# Patient Record
Sex: Female | Born: 1960 | Race: White | Hispanic: No | Marital: Single | Smoking: Current every day smoker
Health system: Southern US, Community
[De-identification: ages and names within clinical notes are randomized; demographics above are authoritative.]

## PROBLEM LIST (undated history)

## (undated) DIAGNOSIS — F419 Anxiety disorder, unspecified: Secondary | ICD-10-CM

## (undated) DIAGNOSIS — I2510 Atherosclerotic heart disease of native coronary artery without angina pectoris: Secondary | ICD-10-CM

## (undated) DIAGNOSIS — R0600 Dyspnea, unspecified: Secondary | ICD-10-CM

## (undated) DIAGNOSIS — G5600 Carpal tunnel syndrome, unspecified upper limb: Secondary | ICD-10-CM

## (undated) DIAGNOSIS — R011 Cardiac murmur, unspecified: Secondary | ICD-10-CM

## (undated) DIAGNOSIS — I7300 Raynaud's syndrome without gangrene: Secondary | ICD-10-CM

## (undated) DIAGNOSIS — IMO0002 Reserved for concepts with insufficient information to code with codable children: Secondary | ICD-10-CM

## (undated) DIAGNOSIS — R911 Solitary pulmonary nodule: Secondary | ICD-10-CM

## (undated) DIAGNOSIS — R590 Localized enlarged lymph nodes: Secondary | ICD-10-CM

## (undated) DIAGNOSIS — E785 Hyperlipidemia, unspecified: Secondary | ICD-10-CM

## (undated) DIAGNOSIS — K219 Gastro-esophageal reflux disease without esophagitis: Secondary | ICD-10-CM

## (undated) DIAGNOSIS — I10 Essential (primary) hypertension: Secondary | ICD-10-CM

## (undated) DIAGNOSIS — J189 Pneumonia, unspecified organism: Secondary | ICD-10-CM

## (undated) DIAGNOSIS — M0520 Rheumatoid vasculitis with rheumatoid arthritis of unspecified site: Secondary | ICD-10-CM

## (undated) DIAGNOSIS — E119 Type 2 diabetes mellitus without complications: Secondary | ICD-10-CM

## (undated) HISTORY — DX: Raynaud's syndrome without gangrene: I73.00

## (undated) HISTORY — PX: TONSILLECTOMY: SUR1361

## (undated) HISTORY — DX: Carpal tunnel syndrome, unspecified upper limb: G56.00

## (undated) HISTORY — DX: Reserved for concepts with insufficient information to code with codable children: IMO0002

## (undated) HISTORY — DX: Hyperlipidemia, unspecified: E78.5

## (undated) HISTORY — PX: TUBAL LIGATION: SHX77

## (undated) HISTORY — DX: Gastro-esophageal reflux disease without esophagitis: K21.9

## (undated) HISTORY — DX: Rheumatoid vasculitis with rheumatoid arthritis of unspecified site: M05.20

## (undated) HISTORY — DX: Essential (primary) hypertension: I10

## (undated) HISTORY — PX: UPPER GI ENDOSCOPY: SHX6162

---

## 1990-01-12 HISTORY — PX: ABDOMINAL HYSTERECTOMY: SHX81

## 1996-11-12 HISTORY — PX: BREAST BIOPSY: SHX20

## 2003-05-03 ENCOUNTER — Other Ambulatory Visit: Payer: Self-pay

## 2003-10-26 ENCOUNTER — Ambulatory Visit: Payer: Self-pay | Admitting: Internal Medicine

## 2004-05-15 ENCOUNTER — Ambulatory Visit: Payer: Self-pay | Admitting: General Surgery

## 2004-06-10 ENCOUNTER — Emergency Department: Payer: Self-pay | Admitting: Emergency Medicine

## 2005-06-16 ENCOUNTER — Ambulatory Visit: Payer: Self-pay | Admitting: General Surgery

## 2005-07-03 ENCOUNTER — Encounter: Payer: Self-pay | Admitting: General Practice

## 2005-08-07 ENCOUNTER — Ambulatory Visit: Payer: Self-pay | Admitting: Internal Medicine

## 2005-08-10 ENCOUNTER — Ambulatory Visit: Payer: Self-pay | Admitting: Internal Medicine

## 2005-09-21 ENCOUNTER — Ambulatory Visit: Payer: Self-pay | Admitting: General Surgery

## 2006-02-11 ENCOUNTER — Ambulatory Visit: Payer: Self-pay | Admitting: Internal Medicine

## 2006-07-27 ENCOUNTER — Ambulatory Visit: Payer: Self-pay | Admitting: General Surgery

## 2007-05-27 ENCOUNTER — Ambulatory Visit: Payer: Self-pay | Admitting: Internal Medicine

## 2007-07-26 ENCOUNTER — Ambulatory Visit: Payer: Self-pay | Admitting: General Surgery

## 2008-01-13 HISTORY — PX: COLONOSCOPY: SHX174

## 2008-07-26 ENCOUNTER — Ambulatory Visit: Payer: Self-pay | Admitting: General Surgery

## 2009-01-12 HISTORY — PX: BREAST BIOPSY: SHX20

## 2009-07-29 ENCOUNTER — Ambulatory Visit: Payer: Self-pay | Admitting: General Surgery

## 2010-01-12 HISTORY — PX: CARPAL TUNNEL RELEASE: SHX101

## 2010-01-28 ENCOUNTER — Ambulatory Visit: Payer: Self-pay | Admitting: Ophthalmology

## 2010-06-30 ENCOUNTER — Ambulatory Visit: Payer: Self-pay | Admitting: Specialist

## 2010-07-07 ENCOUNTER — Ambulatory Visit: Payer: Self-pay | Admitting: Specialist

## 2010-07-31 ENCOUNTER — Ambulatory Visit: Payer: Self-pay | Admitting: General Surgery

## 2011-03-03 ENCOUNTER — Ambulatory Visit: Payer: Self-pay | Admitting: Internal Medicine

## 2011-08-13 ENCOUNTER — Ambulatory Visit: Payer: Self-pay | Admitting: General Surgery

## 2012-08-16 ENCOUNTER — Ambulatory Visit: Payer: Self-pay | Admitting: General Surgery

## 2012-08-18 ENCOUNTER — Encounter: Payer: Self-pay | Admitting: General Surgery

## 2012-08-30 ENCOUNTER — Ambulatory Visit (INDEPENDENT_AMBULATORY_CARE_PROVIDER_SITE_OTHER): Payer: 59 | Admitting: General Surgery

## 2012-08-30 ENCOUNTER — Encounter: Payer: Self-pay | Admitting: General Surgery

## 2012-08-30 VITALS — BP 122/70 | HR 76 | Resp 12 | Ht 63.0 in | Wt 120.0 lb

## 2012-08-30 DIAGNOSIS — Z803 Family history of malignant neoplasm of breast: Secondary | ICD-10-CM

## 2012-08-30 DIAGNOSIS — Z1239 Encounter for other screening for malignant neoplasm of breast: Secondary | ICD-10-CM

## 2012-08-30 DIAGNOSIS — Z1231 Encounter for screening mammogram for malignant neoplasm of breast: Secondary | ICD-10-CM

## 2012-08-30 NOTE — Progress Notes (Signed)
Patient ID: Tracey Walters, female   DOB: 1960-10-22, 52 y.o.   MRN: [id number]  Chief Complaint  Patient presents with  . Follow-up    mammogram    HPI Tracey Walters is a 52 y.o. female who presents for a breast evaluation. The most recent mammogram was done on 08/15/12. Patient does perform regular self breast checks and gets regular mammograms done.  No new breast issues.   HPI  Past Medical History  Diagnosis Date  . Carpal tunnel syndrome     right wrist  . Hypertension 20 yrs  . Ulcer   . Raynaud's phenomenon   . Hyperlipidemia   . GERD (gastroesophageal reflux disease)     Past Surgical History  Procedure Laterality Date  . Colonoscopy  2010  . Upper gi endoscopy    . Breast biopsy Right 2011  . Tonsillectomy    . Tubal ligation    . Abdominal hysterectomy  1992  . Carpal tunnel release Right 2012    Family History  Problem Relation Age of Onset  . Breast cancer Sister     Social History History  Substance Use Topics  . Smoking status: Current Every Day Smoker -- 1.00 packs/day for 30 years    Types: Cigarettes  . Smokeless tobacco: Never Used  . Alcohol Use: Yes    Allergies  Allergen Reactions  . Penicillins Hives    Current Outpatient Prescriptions  Medication Sig Dispense Refill  . amLODipine-benazepril (LOTREL) 5-10 MG per capsule Take 1 capsule by mouth daily.      Marland Kitchen aspirin 81 MG tablet Take 81 mg by mouth daily.      . pantoprazole (PROTONIX) 40 MG tablet Take 40 mg by mouth daily.       No current facility-administered medications for this visit.    Review of Systems Review of Systems  Respiratory: Negative.   Cardiovascular: Negative.     Blood pressure 122/70, pulse 76, resp. rate 12, height 5\' 3"  (1.6 m), weight 120 lb (54.432 kg).  Physical Exam Physical Exam  Constitutional: She is oriented to person, place, and time. She appears well-developed and well-nourished.  Cardiovascular: Normal rate and regular rhythm.   Murmur  heard.  Systolic murmur is present with a grade of 1/6  Pulmonary/Chest: Effort normal and breath sounds normal. Right breast exhibits no inverted nipple, no mass, no nipple discharge, no skin change and no tenderness. Left breast exhibits no inverted nipple, no mass, no nipple discharge, no skin change and no tenderness.  Lymphadenopathy:    She has no cervical adenopathy.    She has no axillary adenopathy.  Neurological: She is alert and oriented to person, place, and time.  Skin: Skin is warm and dry.    Data Reviewed Bilateral mammograms showed August 16, 2012 were reviewed.dense breast consistent with her thin body habitus. No areas of concern for mass effect or microcalcifications. BI-RAD-2.  Assessment    Benign breast exam.     Plan    The patient desires to continue annual exam shows his office.  We'll arrange for a follow up exam and screening mammograms in one year.        Earline Mayotte 08/31/2012, 8:51 PM

## 2012-08-30 NOTE — Patient Instructions (Addendum)
Continue self breast exams. Call office for any new breast issues or concerns. Follow up in one year for bilateral screening mammogram

## 2012-08-31 ENCOUNTER — Encounter: Payer: Self-pay | Admitting: General Surgery

## 2012-08-31 DIAGNOSIS — Z1231 Encounter for screening mammogram for malignant neoplasm of breast: Secondary | ICD-10-CM | POA: Insufficient documentation

## 2013-09-06 ENCOUNTER — Ambulatory Visit: Payer: 59 | Admitting: General Surgery

## 2013-10-03 ENCOUNTER — Ambulatory Visit: Payer: Self-pay | Admitting: General Surgery

## 2013-10-04 ENCOUNTER — Encounter: Payer: Self-pay | Admitting: General Surgery

## 2013-10-10 ENCOUNTER — Encounter: Payer: Self-pay | Admitting: General Surgery

## 2013-10-11 ENCOUNTER — Ambulatory Visit: Payer: Self-pay | Admitting: General Surgery

## 2013-10-11 ENCOUNTER — Encounter: Payer: Self-pay | Admitting: General Surgery

## 2013-10-11 ENCOUNTER — Ambulatory Visit (INDEPENDENT_AMBULATORY_CARE_PROVIDER_SITE_OTHER): Payer: 59 | Admitting: General Surgery

## 2013-10-11 VITALS — BP 110/70 | HR 80 | Resp 12 | Ht 63.0 in | Wt 117.0 lb

## 2013-10-11 DIAGNOSIS — Z1239 Encounter for other screening for malignant neoplasm of breast: Secondary | ICD-10-CM

## 2013-10-11 DIAGNOSIS — Z0000 Encounter for general adult medical examination without abnormal findings: Secondary | ICD-10-CM | POA: Insufficient documentation

## 2013-10-11 NOTE — Patient Instructions (Signed)
Continue self breast exams. Call office for any new breast issues or concerns. Patient to return in 1 year for follow up.

## 2013-10-11 NOTE — Progress Notes (Signed)
Patient ID: Tracey Walters, female   DOB: [birth year], 53 y.o.   MRN: [id number]  Chief Complaint  Patient presents with  . Follow-up    mammogram    HPI Tracey Walters is a 53 y.o. female.  who presents for a breast evaluation. The most recent mammogram was done on 10-03-13. Patient does perform regular self breast checks and gets regular mammograms done.  No new complaints at this time.   HPI  Past Medical History  Diagnosis Date  . Carpal tunnel syndrome     right wrist  . Hypertension 20 yrs  . Ulcer   . Raynaud's phenomenon   . Hyperlipidemia   . GERD (gastroesophageal reflux disease)     Past Surgical History  Procedure Laterality Date  . Colonoscopy  2010  . Upper gi endoscopy    . Tonsillectomy    . Tubal ligation    . Abdominal hysterectomy  1992  . Carpal tunnel release Right 2012  . Breast biopsy Right 2011  . Breast biopsy Right November 1998    Dense fibrosis,  benign breast parenchyma.    Family History  Problem Relation Age of Onset  . Breast cancer Sister     Social History History  Substance Use Topics  . Smoking status: Current Every Day Smoker -- 1.00 packs/day for 30 years    Types: Cigarettes  . Smokeless tobacco: Never Used  . Alcohol Use: Yes    Allergies  Allergen Reactions  . Penicillins Hives    Current Outpatient Prescriptions  Medication Sig Dispense Refill  . amLODipine-benazepril (LOTREL) 5-10 MG per capsule Take 1 capsule by mouth daily.      Marland Kitchen aspirin 81 MG tablet Take 81 mg by mouth daily.      . pantoprazole (PROTONIX) 40 MG tablet Take 40 mg by mouth daily.       No current facility-administered medications for this visit.    Review of Systems Review of Systems  Constitutional: Negative.   Respiratory: Negative.   Cardiovascular: Negative.     Blood pressure 110/70, pulse 80, resp. rate 12, height 5\' 3"  (1.6 m), weight 117 lb (53.071 kg).  Physical Exam Physical Exam  Constitutional: She is oriented to  person, place, and time. She appears well-developed and well-nourished.  Neck: Neck supple. No thyromegaly present.  Cardiovascular: Normal rate, regular rhythm and normal heart sounds.   No murmur heard. Pulmonary/Chest: Effort normal and breath sounds normal. Right breast exhibits no inverted nipple, no mass, no nipple discharge, no skin change and no tenderness. Left breast exhibits no inverted nipple, no mass, no nipple discharge, no skin change and no tenderness.  Lymphadenopathy:    She has no cervical adenopathy.    She has no axillary adenopathy.  Neurological: She is alert and oriented to person, place, and time.  Skin: Skin is warm and dry.    Data Reviewed Bilateral screening mammograms dated October 03, 2048 were reviewed and compared to previous studies. No interval change. BI-RAD-1.  Assessment    Benign breast exam.     Plan    The patient desires to continue annual screening exams in this office. We'll plan for followup in one year.      PCP: Cletis Athens Ref. MD: Dr. Percell Boston, [place] Gleason 10/11/2013, 8:14 PM

## 2013-11-13 ENCOUNTER — Encounter: Payer: Self-pay | Admitting: General Surgery

## 2014-04-18 ENCOUNTER — Encounter: Payer: Self-pay | Admitting: *Deleted

## 2014-05-31 ENCOUNTER — Ambulatory Visit
Admission: RE | Admit: 2014-05-31 | Discharge: 2014-05-31 | Disposition: A | Discharge status: Home / Self Care | Payer: 59 | Source: Ambulatory Visit | Attending: Cardiology | Admitting: Cardiology

## 2014-05-31 ENCOUNTER — Ambulatory Visit
Admission: RE | Admit: 2014-05-31 | Discharge: 2014-05-31 | Disposition: A | Discharge status: Home / Self Care | Payer: 59 | Source: Ambulatory Visit | Attending: Internal Medicine | Admitting: Internal Medicine

## 2014-05-31 ENCOUNTER — Other Ambulatory Visit: Payer: Self-pay | Admitting: Internal Medicine

## 2014-05-31 DIAGNOSIS — J984 Other disorders of lung: Secondary | ICD-10-CM | POA: Diagnosis not present

## 2014-05-31 DIAGNOSIS — R059 Cough, unspecified: Secondary | ICD-10-CM

## 2014-05-31 DIAGNOSIS — R05 Cough: Secondary | ICD-10-CM

## 2014-05-31 DIAGNOSIS — R062 Wheezing: Secondary | ICD-10-CM | POA: Diagnosis present

## 2014-05-31 DIAGNOSIS — S2241XA Multiple fractures of ribs, right side, initial encounter for closed fracture: Secondary | ICD-10-CM | POA: Diagnosis not present

## 2014-08-09 ENCOUNTER — Other Ambulatory Visit: Payer: Self-pay

## 2014-08-09 DIAGNOSIS — Z1231 Encounter for screening mammogram for malignant neoplasm of breast: Secondary | ICD-10-CM

## 2014-10-05 ENCOUNTER — Ambulatory Visit
Admission: RE | Admit: 2014-10-05 | Discharge: 2014-10-05 | Disposition: A | Discharge status: Home / Self Care | Payer: 59 | Source: Ambulatory Visit | Attending: General Surgery | Admitting: General Surgery

## 2014-10-05 ENCOUNTER — Other Ambulatory Visit: Payer: Self-pay | Admitting: General Surgery

## 2014-10-05 DIAGNOSIS — Z1231 Encounter for screening mammogram for malignant neoplasm of breast: Secondary | ICD-10-CM

## 2014-10-16 ENCOUNTER — Encounter: Payer: Self-pay | Admitting: General Surgery

## 2014-10-16 ENCOUNTER — Ambulatory Visit (INDEPENDENT_AMBULATORY_CARE_PROVIDER_SITE_OTHER): Payer: 59 | Admitting: General Surgery

## 2014-10-16 VITALS — BP 110/58 | HR 64 | Resp 12 | Ht 64.0 in | Wt 116.0 lb

## 2014-10-16 DIAGNOSIS — Z1239 Encounter for other screening for malignant neoplasm of breast: Secondary | ICD-10-CM | POA: Diagnosis not present

## 2014-10-16 NOTE — Patient Instructions (Signed)
Patient will be asked to return to the office in one year with a bilateral screening mammogram. 

## 2014-10-16 NOTE — Progress Notes (Signed)
Patient ID: Tracey Walters, female   DOB: July 16, 1960, 54 y.o.   MRN: [id number]  Chief Complaint  Patient presents with  . Follow-up    mammogram    HPI Tracey Walters is a 54 y.o. female who presents for a breast evaluation. The most recent mammogram was done on 10/05/14.  Patient does perform regular self breast checks and gets regular mammograms done.    HPI  Past Medical History  Diagnosis Date  . Carpal tunnel syndrome     right wrist  . Hypertension 20 yrs  . Ulcer   . Raynaud's phenomenon   . Hyperlipidemia   . GERD (gastroesophageal reflux disease)     Past Surgical History  Procedure Laterality Date  . Colonoscopy  2010  . Upper gi endoscopy    . Tonsillectomy    . Tubal ligation    . Abdominal hysterectomy  1992  . Carpal tunnel release Right 2012  . Breast biopsy Right 2011  . Breast biopsy Right November 1998    Dense fibrosis,  benign breast parenchyma.    Family History  Problem Relation Age of Onset  . Breast cancer Sister   . Breast cancer Maternal Aunt     Social History Social History  Substance Use Topics  . Smoking status: Current Every Day Smoker -- 1.00 packs/day for 30 years    Types: Cigarettes  . Smokeless tobacco: Never Used  . Alcohol Use: Yes    Allergies  Allergen Reactions  . Penicillins Hives    Current Outpatient Prescriptions  Medication Sig Dispense Refill  . amLODipine-benazepril (LOTREL) 5-10 MG per capsule Take 1 capsule by mouth daily.    Marland Kitchen aspirin 81 MG tablet Take 81 mg by mouth daily.    . pantoprazole (PROTONIX) 40 MG tablet Take 40 mg by mouth daily.     No current facility-administered medications for this visit.    Review of Systems Review of Systems  Constitutional: Negative.   Respiratory: Negative.   Cardiovascular: Negative.     Blood pressure 110/58, pulse 64, resp. rate 12, height 5\' 4"  (1.626 m), weight 116 lb (52.617 kg).  Physical Exam Physical Exam  Constitutional: She is oriented  to person, place, and time. She appears well-developed and well-nourished.  Eyes: Conjunctivae are normal. No scleral icterus.  Neck: Neck supple.  Cardiovascular: Normal rate, regular rhythm and normal heart sounds.   Pulmonary/Chest: Effort normal and breath sounds normal. Right breast exhibits no inverted nipple, no mass, no nipple discharge, no skin change and no tenderness. Left breast exhibits no inverted nipple, no mass, no nipple discharge, no skin change and no tenderness.  Lymphadenopathy:    She has no cervical adenopathy.    She has no axillary adenopathy.  Neurological: She is alert and oriented to person, place, and time.  Skin: Skin is warm and dry.    Data Reviewed 10/05/2014 screening mammograms reviewed. No interval change. By red-1.   Assessment    Benign breast exam.    Plan    The patient desires annual screening through this office.    Patient will be asked to return to the office in one year with a bilateral screening mammogram. PCP:  Dennison Bulla 10/16/2014, 8:07 PM

## 2015-04-24 DIAGNOSIS — J42 Unspecified chronic bronchitis: Secondary | ICD-10-CM | POA: Diagnosis not present

## 2015-04-24 DIAGNOSIS — F17200 Nicotine dependence, unspecified, uncomplicated: Secondary | ICD-10-CM | POA: Diagnosis not present

## 2015-04-24 DIAGNOSIS — I7300 Raynaud's syndrome without gangrene: Secondary | ICD-10-CM | POA: Diagnosis not present

## 2015-04-30 DIAGNOSIS — M5412 Radiculopathy, cervical region: Secondary | ICD-10-CM | POA: Diagnosis not present

## 2015-04-30 DIAGNOSIS — G5602 Carpal tunnel syndrome, left upper limb: Secondary | ICD-10-CM | POA: Diagnosis not present

## 2015-05-01 ENCOUNTER — Ambulatory Visit: Payer: 59 | Attending: Specialist

## 2015-05-01 DIAGNOSIS — M25511 Pain in right shoulder: Secondary | ICD-10-CM | POA: Insufficient documentation

## 2015-05-01 DIAGNOSIS — M542 Cervicalgia: Secondary | ICD-10-CM | POA: Diagnosis not present

## 2015-05-02 NOTE — Therapy (Signed)
[place] MAIN [facility] SERVICES [address] [place], [place], [address] Phone: [phone]   Fax:  [phone]  Physical Therapy Evaluation  Patient Details  Name: Tracey Walters MRN: [id number] Date of Birth: [birth year] Referring Provider: Jonny Ruiz  Encounter Date: 05/01/2015      PT End of Session - 05/02/15 0905    Visit Number 1   Number of Visits 9   Date for PT Re-Evaluation 05/30/15   PT Start Time [id number]   PT Stop Time 1815   PT Time Calculation (min) 45 min   Activity Tolerance No increased pain   Behavior During Therapy [facility] for tasks assessed/performed      Past Medical History  Diagnosis Date  . Carpal tunnel syndrome     right wrist  . Hypertension 20 yrs  . Ulcer   . Raynaud's phenomenon   . Hyperlipidemia   . GERD (gastroesophageal reflux disease)   . Rheumatoid arteritis     Past Surgical History  Procedure Laterality Date  . Colonoscopy  2010  . Upper gi endoscopy    . Tonsillectomy    . Tubal ligation    . Abdominal hysterectomy  1992  . Carpal tunnel release Right 2012  . Breast biopsy Right 2011  . Breast biopsy Right November 1998    Dense fibrosis,  benign breast parenchyma.    There were no vitals filed for this visit.       Subjective Assessment - 05/01/15 1735    Subjective pt reports neck and shoulder pain over the past year. pt reports her pain, especially to the R upper trap has gotten worse in the past 3 months. she reports moving her neck and shoulders hurts. 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 pt denies any numbness/tingling or pain that radiates past the shoulder. pt has no change in bowel bladder.    Pertinent History current smoker, Raynauds syndrome, hx of CT surgery R   Patient Stated Goals pt would like to be able look up and do her job with less pain            [facility] PT Assessment - 05/02/15 0001    Assessment    Medical Diagnosis Cervical Radiculopathy   Referring Provider Jonny Ruiz   Onset Date/Surgical Date 02/01/15   Hand Dominance Right   Next MD Visit 05/14/15   Prior Therapy no   Precautions   Precautions None   Restrictions   Weight Bearing Restrictions No   Balance Screen   Has the patient fallen in the past 6 months No   Has the patient had a decrease in activity level because of a fear of falling?  No   Is the patient reluctant to leave their home because of a fear of falling?  No   Home Environment   Living Environment Private residence   Living Arrangements Children   Available Help at Discharge Family   Type of [place] to enter   Entrance Stairs-Number of Steps 3   Entrance Stairs-Rails Right   [place] One level   Prior Function   Level of Independence Independent;Independent with community mobility without device  less pain   Vocation Full time employment   Vocation Requirements lifting overhead light weight         POSTURE/OBSERVATION: Pt has poor seated and standing posture. Very forward head. Rounded shoulders,  inc. Kyphosis   PROM/AROM: Cervical flexion/extension WFL with pain into extension  Cervical L rotation 50 deg with pain on the R Cervical R rotation 60 deg with pain on the L Cervical L sidebending 25 deg with pain on the R Cervical R sidebending 30 deg with no pain  Pt has full shoulder ROM bilaterally significant for upper trap substitution   Palpation: Pt is tender to bilateral upper trap R>L Pt is tender along T spine with hypomobility noted Pt is tender C4-7 as well with hypomobility noted Tenderness to R RTC insertion   STRENGTH:  Graded on a 0-5 scale Muscle Group Left Right  Shoulder flex 4 4  Shoulder Abd 4 4  Shoulder Ext 4- 4-  Shoulder IR/ER 4- 4-  Elbow 5 5  Wrist/hand  5 5  Middle trap 3+ 3+  Lower trap  3+ 3+                               Myotomes/dermatomes WNL bilaterally    SENSATION: WNL   SPECIAL TESTS: Hawkins kennedy (+) R Neers: (+) R Empty can (-) Full can (+) BUE spurlings (-)   OUTCOME MEASURES: TEST Outcome Interpretation  NDI:  26% disability  mild                     Therex:   Posture re-education including sitting with lumbar roll  Chin tuck x 10 2x/day scap retraction AROM x 10 2x/day Upper trap stretch 30s x 2 each side  Repeated T spine ext over chair 2x10 2x/day  Pt requires min-mod verbal and tactile cues for proper exercise performance                   PT Education - 05/02/15 0905    Education provided Yes   Education Details exam findings, plan of care, posture ed, HEP   Person(s) Educated Patient   Methods Explanation;Handout   Comprehension Verbalized understanding             PT Long Term Goals - 05/02/15 0915    PT LONG TERM GOAL #1   Title pt will be independent and compliant to HEP for scapular strengthening and posture re-ed   Time 4   Period Weeks   Status New   PT LONG TERM GOAL #2   Title pt will demonstrate 70 deg cervical rotation with pain <3/10 in order to drive comfortably   Time 4   Period Weeks   Status New   PT LONG TERM GOAL #3   Title pt will reduce NDI by 8 pts to demonstrate signficiant functional progress regarding her neck .   Baseline 26% disability    Time 4   Period Weeks   Status New   PT LONG TERM GOAL #4   Title pt will be able to lift 3lb object overhead x 5 without shoulder shrug without increased pain   Time 4   Period Weeks   Status New               Plan - 05/02/15 0906    Clinical Impression Statement pt presents with 1+ year onset of neck/shoulder pain, but exaccerbated 3 months ago. she does not have signs / symptoms of radiculopathy at this time. she does demonstrate severe poor posture with forward head and rounded shoulders. pt demonstrates reduced CS ROM with contralateral report of pain to upper traps. pt  shows dominant upper trap  activation with all overhead movements and has impaired periscapular strength. she also demonstrates s/s consistant with shoulder impingement R>L. pt would benefit from continued skilled PT services to address her mobility, ROM strength, pain and flexibility. pt is initially resistant to PT HEP and posture re-education which may limit her progress.    Rehab Potential Fair   PT Frequency 2x / week   PT Duration 4 weeks   PT Treatment/Interventions ADLs/Self Care Home Management;Cryotherapy;Electrical Stimulation;Iontophoresis 4mg /ml Dexamethasone;Moist Heat;Traction;Ultrasound;Neuromuscular re-education;Therapeutic exercise;Therapeutic activities;Manual techniques;Passive range of motion;Dry needling;Taping   Consulted and Agree with Plan of Care Patient      Patient will benefit from skilled therapeutic intervention in order to improve the following deficits and impairments:  Decreased strength, Hypomobility, Decreased range of motion, Impaired flexibility, Pain, Postural dysfunction, Improper body mechanics  Visit Diagnosis: Cervicalgia - Plan: PT plan of care cert/re-cert  Pain in right shoulder - Plan: PT plan of care cert/re-cert     Problem List Patient Active Problem List   Diagnosis Date Noted  . Breast screening 10/11/2013  . Screening mammogram for high-risk patient 08/31/2012   Gorden Harms. Mikhala Kenan, PT, DPT [phone]  Kohner Orlick 05/02/2015, 9:19 AM  [place] MAIN [facility] SERVICES [address] [place], [place], [address] Phone: [phone]   Fax:  [phone]  Name: Tracey Walters MRN: [id number] Date of Birth: Jun 25, 1960

## 2015-05-02 NOTE — Patient Instructions (Signed)
[email or web address] Posture re-education including sitting with lumbar roll  Chin tuck x 10 2x/day scap retraction AROM x 10 2x/day Upper trap stretch 30s x 2 each side  Repeated T spine ext over chair 2x10 2x/day

## 2015-05-06 ENCOUNTER — Ambulatory Visit: Payer: 59

## 2015-05-06 DIAGNOSIS — M25511 Pain in right shoulder: Secondary | ICD-10-CM | POA: Diagnosis not present

## 2015-05-06 DIAGNOSIS — M542 Cervicalgia: Secondary | ICD-10-CM | POA: Diagnosis not present

## 2015-05-07 NOTE — Therapy (Signed)
[place] MAIN [facility] SERVICES [address] [place], [place], [address] Phone: [phone]   Fax:  [phone]  Physical Therapy Treatment  Patient Details  Name: Tracey Walters MRN: [id number] Date of Birth: Apr 02, 1960 Referring Provider: Jonny Ruiz  Encounter Date: 05/06/2015      PT End of Session - 05/07/15 0800    Visit Number 2   Number of Visits 9   Date for PT Re-Evaluation 05/30/15   PT Start Time [id number]   PT Stop Time 1822   PT Time Calculation (min) 50 min   Activity Tolerance No increased pain   Behavior During Therapy [facility] for tasks assessed/performed      Past Medical History  Diagnosis Date  . Carpal tunnel syndrome     right wrist  . Hypertension 20 yrs  . Ulcer   . Raynaud's phenomenon   . Hyperlipidemia   . GERD (gastroesophageal reflux disease)   . Rheumatoid arteritis     Past Surgical History  Procedure Laterality Date  . Colonoscopy  2010  . Upper gi endoscopy    . Tonsillectomy    . Tubal ligation    . Abdominal hysterectomy  1992  . Carpal tunnel release Right 2012  . Breast biopsy Right 2011  . Breast biopsy Right November 1998    Dense fibrosis,  benign breast parenchyma.    There were no vitals filed for this visit.      Subjective Assessment - 05/07/15 0759    Subjective pt reports her symptoms are a little better. she reports she has been doing her HEP, but also hasnt worked in 4 days which she attributes to having less pain.    Pertinent History current smoker, Raynauds syndrome, hx of CT surgery R   Patient Stated Goals pt would like to be able look up and do her job with less pain   Currently in Pain? Yes   Pain Score 1    Pain Location --  bilateral shoulders with shoulder flexion       Manual therapy  Extensive joint mobilizations to T spine C2-T12 grade II-III 3-5 bouts of 30s  Pt is hypomobile throughout, tender especially over C2, C7, T1, T4  ThereX: Doorway stretch 30s x  3 Low row yellow band 2x10 with Pt requires min-mod verbal and tactile cues for proper exercise performance                            PT Education - 05/07/15 0800    Education provided Yes   Education Details importance of thoracic mobility for neck and shoulders   Person(s) Educated Patient   Methods Explanation   Comprehension Verbalized understanding             PT Long Term Goals - 05/02/15 0915    PT LONG TERM GOAL #1   Title pt will be independent and compliant to HEP for scapular strengthening and posture re-ed   Time 4   Period Weeks   Status New   PT LONG TERM GOAL #2   Title pt will demonstrate 70 deg cervical rotation with pain <3/10 in order to drive comfortably   Time 4   Period Weeks   Status New   PT LONG TERM GOAL #3   Title pt will reduce NDI by 8 pts to demonstrate signficiant functional progress regarding her neck .   Baseline 26% disability    Time 4  Period Weeks   Status New   PT LONG TERM GOAL #4   Title pt will be able to lift 3lb object overhead x 5 without shoulder shrug without increased pain   Time 4   Period Weeks   Status New               Plan - 05/07/15 0801    Clinical Impression Statement pt responded well to manual therapy with improved abilty to extend her T spine and no longer had shoulder pain with flexion. pt was more subseptive to therapy plan today as well. progressed flexibility to doorway pec stretch and resisted scapular retraction. pt needed cues to reduce shoulder elevation with upper trap dominance.    Rehab Potential Fair   PT Frequency 2x / week   PT Duration 4 weeks   PT Treatment/Interventions ADLs/Self Care Home Management;Cryotherapy;Electrical Stimulation;Iontophoresis 4mg /ml Dexamethasone;Moist Heat;Traction;Ultrasound;Neuromuscular re-education;Therapeutic exercise;Therapeutic activities;Manual techniques;Passive range of motion;Dry needling;Taping   Consulted and Agree with Plan of  Care Patient      Patient will benefit from skilled therapeutic intervention in order to improve the following deficits and impairments:  Decreased strength, Hypomobility, Decreased range of motion, Impaired flexibility, Pain, Postural dysfunction, Improper body mechanics  Visit Diagnosis: Cervicalgia  Pain in right shoulder     Problem List Patient Active Problem List   Diagnosis Date Noted  . Breast screening 10/11/2013  . Screening mammogram for high-risk patient 08/31/2012   Gorden Harms. Leydy Worthey, PT, DPT [phone]  Kristin Lamagna 05/07/2015, 8:05 AM  [place] MAIN [facility] SERVICES [address] [place], [place], [address] Phone: [phone]   Fax:  [phone]  Name: Tracey Walters MRN: [id number] Date of Birth: 09/20/1960

## 2015-05-08 ENCOUNTER — Ambulatory Visit: Payer: 59

## 2015-05-08 DIAGNOSIS — M25511 Pain in right shoulder: Secondary | ICD-10-CM | POA: Diagnosis not present

## 2015-05-08 DIAGNOSIS — M542 Cervicalgia: Secondary | ICD-10-CM

## 2015-05-09 NOTE — Therapy (Signed)
[place] MAIN [facility] SERVICES [address] [place], [place], [address] Phone: [phone]   Fax:  [phone]  Physical Therapy Treatment  Patient Details  Name: Tracey Walters MRN: [id number] Date of Birth: 1960-03-01 Referring Provider: Jonny Ruiz  Encounter Date: 05/08/2015      PT End of Session - 05/09/15 0904    Visit Number 3   Number of Visits 9   Date for PT Re-Evaluation 05/30/15   PT Start Time 9758   PT Stop Time 1822   PT Time Calculation (min) 52 min   Activity Tolerance No increased pain   Behavior During Therapy [facility] for tasks assessed/performed      Past Medical History  Diagnosis Date  . Carpal tunnel syndrome     right wrist  . Hypertension 20 yrs  . Ulcer   . Raynaud's phenomenon   . Hyperlipidemia   . GERD (gastroesophageal reflux disease)   . Rheumatoid arteritis     Past Surgical History  Procedure Laterality Date  . Colonoscopy  2010  . Upper gi endoscopy    . Tonsillectomy    . Tubal ligation    . Abdominal hysterectomy  1992  . Carpal tunnel release Right 2012  . Breast biopsy Right 2011  . Breast biopsy Right November 1998    Dense fibrosis,  benign breast parenchyma.    There were no vitals filed for this visit.      Subjective Assessment - 05/09/15 0903    Subjective pt reports her thoracic spine was sore after last session, but she thinks the exercises are helping with her pain. she reports her pain is not as bad as it was before in the neck and shoulder.    Pertinent History current smoker, Raynauds syndrome, hx of CT surgery R   Patient Stated Goals pt would like to be able look up and do her job with less pain   Currently in Pain? Yes   Pain Score 3    Pain Location Neck   Pain Orientation Right   Pain Descriptors / Indicators Tightness;Aching     manual therapy  Extensive manual therapy to the T spine and C spine CPA glides to T spine and C2-C7 grade I-III as tolerated. 3  bouts of 30s each level Lateral glides R to C4-6 grade II  intermittant manual C spine traction 30s on 10s off x 10 PROM to the neck with gentle oscillations at end range into L/R rotation and L/R sidebending MET to bilateral upper traps    therex: cat/camel stretch 2x10, prayer stretch2x30s, CS retraction with extension x10  Pt requires min verbal and tactile cues for proper exercise performance                        PT Education - 05/09/15 0904    Education provided Yes   Education Details continue to make posture adjustments. cat/camel stretch, prayer stretch, CS retraction with extension              PT Long Term Goals - 05/02/15 0915    PT LONG TERM GOAL #1   Title pt will be independent and compliant to HEP for scapular strengthening and posture re-ed   Time 4   Period Weeks   Status New   PT LONG TERM GOAL #2   Title pt will demonstrate 70 deg cervical rotation with pain <3/10 in order to drive comfortably   Time 4  Period Weeks   Status New   PT LONG TERM GOAL #3   Title pt will reduce NDI by 8 pts to demonstrate signficiant functional progress regarding her neck .   Baseline 26% disability    Time 4   Period Weeks   Status New   PT LONG TERM GOAL #4   Title pt will be able to lift 3lb object overhead x 5 without shoulder shrug without increased pain   Time 4   Period Weeks   Status New               Plan - 05/09/15 0904    Clinical Impression Statement pt is making good progress and responded well th therex and manual therapy today. pt educated that soreness after her mobs may be expected due to the amout of hypomobility she has. PT adjusted hand placement for T spine mobs due to her pominant spinous process. progressed HEP as well. pt responded very well to manual therapy today especially CS PROM and MET.    Rehab Potential Fair   PT Frequency 2x / week   PT Duration 4 weeks   PT Treatment/Interventions ADLs/Self Care Home  Management;Cryotherapy;Electrical Stimulation;Iontophoresis 75m/ml Dexamethasone;Moist Heat;Traction;Ultrasound;Neuromuscular re-education;Therapeutic exercise;Therapeutic activities;Manual techniques;Passive range of motion;Dry needling;Taping   Consulted and Agree with Plan of Care Patient      Patient will benefit from skilled therapeutic intervention in order to improve the following deficits and impairments:  Decreased strength, Hypomobility, Decreased range of motion, Impaired flexibility, Pain, Postural dysfunction, Improper body mechanics  Visit Diagnosis: Cervicalgia  Pain in right shoulder     Problem List Patient Active Problem List   Diagnosis Date Noted  . Breast screening 10/11/2013  . Screening mammogram for high-risk patient 08/31/2012   AGorden Harms Tortorici, PT, DPT #[id number] Tortorici,Ashley 05/09/2015, 9:07 AM  [place] [facility] [address] [address]Phone: [phone]  Fax:  [phone] Name: Tracey ShorMRN: [id number] of Birth: 4May 02, 1962

## 2015-05-13 ENCOUNTER — Ambulatory Visit: Payer: 59 | Attending: Specialist

## 2015-05-13 DIAGNOSIS — M542 Cervicalgia: Secondary | ICD-10-CM | POA: Insufficient documentation

## 2015-05-13 DIAGNOSIS — M25511 Pain in right shoulder: Secondary | ICD-10-CM | POA: Diagnosis not present

## 2015-05-13 NOTE — Therapy (Signed)
[place] MAIN [facility] SERVICES [address] [place], [place], [address] Phone: [phone]   Fax:  [phone]  Physical Therapy Treatment  Patient Details  Name: Tracey Walters MRN: [id number] Date of Birth: 28-Jul-1960 Referring Provider: Jonny Ruiz  Encounter Date: 05/13/2015      PT End of Session - 05/13/15 1837    Visit Number 4   Number of Visits 9   Date for PT Re-Evaluation 05/30/15   PT Start Time [id number]   PT Stop Time 1830   PT Time Calculation (min) 55 min   Activity Tolerance No increased pain   Behavior During Therapy [facility] for tasks assessed/performed      Past Medical History  Diagnosis Date  . Carpal tunnel syndrome     right wrist  . Hypertension 20 yrs  . Ulcer   . Raynaud's phenomenon   . Hyperlipidemia   . GERD (gastroesophageal reflux disease)   . Rheumatoid arteritis     Past Surgical History  Procedure Laterality Date  . Colonoscopy  2010  . Upper gi endoscopy    . Tonsillectomy    . Tubal ligation    . Abdominal hysterectomy  1992  . Carpal tunnel release Right 2012  . Breast biopsy Right 2011  . Breast biopsy Right November 1998    Dense fibrosis,  benign breast parenchyma.    There were no vitals filed for this visit.      Subjective Assessment - 05/13/15 1748    Subjective pt reports shes feeling much better. she is more aware of her posture and is working on it.    Pertinent History current smoker, Raynauds syndrome, hx of CT surgery R   Patient Stated Goals pt would like to be able look up and do her job with less pain   Currently in Pain? Yes   Pain Score 1    Pain Location Neck     MHP: x 8 min no charge  Manual therapy: CPA and rotational mobs to T spine 30s bouts x 3 grade III-IV  CPA mobs to C6-7 and C2 grade II-III 4 bouts 30s  General rib springing x 1 min each side  Manual CS traction in supine 20s on 10s off x 8 Passive upper trap stretch 30s x 2 each  side  Therex: sidelying shoulder ER with 1lbs 3x10 sidelying shoulder flexion with 1lb 2x10 Wall slide with lift off 2x10 Shoulder IR with yellow band 3x10 Pt requires min verbal and tactile cues for proper exercise performance    Pt had no inc pain with activities. Needs min-mod cues for exercise and to keep from shoulder shrug                            PT Education - 05/13/15 1837    Education provided Yes   Education Details new HEP   Person(s) Educated Patient   Methods Explanation   Comprehension Verbalized understanding             PT Long Term Goals - 05/02/15 0915    PT LONG TERM GOAL #1   Title pt will be independent and compliant to HEP for scapular strengthening and posture re-ed   Time 4   Period Weeks   Status New   PT LONG TERM GOAL #2   Title pt will demonstrate 70 deg cervical rotation with pain <3/10 in order to drive comfortably   Time 4  Period Weeks   Status New   PT LONG TERM GOAL #3   Title pt will reduce NDI by 8 pts to demonstrate signficiant functional progress regarding her neck .   Baseline 26% disability    Time 4   Period Weeks   Status New   PT LONG TERM GOAL #4   Title pt will be able to lift 3lb object overhead x 5 without shoulder shrug without increased pain   Time 4   Period Weeks   Status New               Plan - 05/13/15 1837    Clinical Impression Statement pt is progressing nicely with PT. her ROM of the neck is near normal upon gross inspection. progressed HEP today to include RTC and scap strengthening.    Rehab Potential Fair   PT Frequency 2x / week   PT Duration 4 weeks   PT Treatment/Interventions ADLs/Self Care Home Management;Cryotherapy;Electrical Stimulation;Iontophoresis 4mg /ml Dexamethasone;Moist Heat;Traction;Ultrasound;Neuromuscular re-education;Therapeutic exercise;Therapeutic activities;Manual techniques;Passive range of motion;Dry needling;Taping   Consulted and Agree with  Plan of Care Patient      Patient will benefit from skilled therapeutic intervention in order to improve the following deficits and impairments:  Decreased strength, Hypomobility, Decreased range of motion, Impaired flexibility, Pain, Postural dysfunction, Improper body mechanics  Visit Diagnosis: Cervicalgia  Pain in right shoulder     Problem List Patient Active Problem List   Diagnosis Date Noted  . Breast screening 10/11/2013  . Screening mammogram for high-risk patient 08/31/2012   Gorden Harms. Ajayla Iglesias, PT, DPT [phone]  Tracey Walters 05/13/2015, 6:39 PM  [place] MAIN [facility] SERVICES [address] [place], [place], [address] Phone: [phone]   Fax:  [phone]  Name: Tracey Walters MRN: [id number] Date of Birth: 06/14/1960

## 2015-05-13 NOTE — Patient Instructions (Signed)
[email or web address] sidelying shoulder ER with 1lbs 3x10 sidelying shoulder flexion with 1lb 2x10 Wall slide with lift off 2x10 Shoulder IR with yellow band 3x10

## 2015-05-15 ENCOUNTER — Ambulatory Visit: Payer: 59

## 2015-05-16 DIAGNOSIS — M5412 Radiculopathy, cervical region: Secondary | ICD-10-CM | POA: Diagnosis not present

## 2015-05-20 ENCOUNTER — Ambulatory Visit: Payer: 59

## 2015-05-21 DIAGNOSIS — H5203 Hypermetropia, bilateral: Secondary | ICD-10-CM | POA: Diagnosis not present

## 2015-08-07 ENCOUNTER — Other Ambulatory Visit: Payer: Self-pay | Admitting: General Surgery

## 2015-08-07 DIAGNOSIS — Z1231 Encounter for screening mammogram for malignant neoplasm of breast: Secondary | ICD-10-CM

## 2015-08-20 ENCOUNTER — Encounter: Payer: Self-pay | Admitting: *Deleted

## 2015-10-07 ENCOUNTER — Ambulatory Visit
Admission: RE | Admit: 2015-10-07 | Discharge: 2015-10-07 | Disposition: A | Discharge status: Home / Self Care | Payer: 59 | Source: Ambulatory Visit | Attending: General Surgery | Admitting: General Surgery

## 2015-10-07 ENCOUNTER — Other Ambulatory Visit: Payer: Self-pay | Admitting: General Surgery

## 2015-10-07 DIAGNOSIS — Z1231 Encounter for screening mammogram for malignant neoplasm of breast: Secondary | ICD-10-CM

## 2015-10-14 ENCOUNTER — Ambulatory Visit (INDEPENDENT_AMBULATORY_CARE_PROVIDER_SITE_OTHER): Payer: 59 | Admitting: General Surgery

## 2015-10-14 ENCOUNTER — Encounter: Payer: Self-pay | Admitting: General Surgery

## 2015-10-14 VITALS — BP 110/66 | HR 76 | Resp 12 | Ht 64.0 in | Wt 122.0 lb

## 2015-10-14 DIAGNOSIS — Z1239 Encounter for other screening for malignant neoplasm of breast: Secondary | ICD-10-CM

## 2015-10-14 DIAGNOSIS — Z1231 Encounter for screening mammogram for malignant neoplasm of breast: Secondary | ICD-10-CM | POA: Diagnosis not present

## 2015-10-14 NOTE — Patient Instructions (Addendum)
Patient will be asked to return to the office in one year with a bilateral screening mammogram. Call if any changes or concerns.

## 2015-10-14 NOTE — Progress Notes (Signed)
Patient ID: Tracey Walters, female   DOB: 11/21/1960, 55 y.o.   MRN: [id number]  Chief Complaint  Patient presents with  . Follow-up    mammogram    HPI Tracey Walters is a 55 y.o. female who presents for a breast evaluation. The most recent mammogram was done on 10/07/15.  Patient does perform regular self breast checks and gets regular mammograms done. Patient reports no new breast issues.     HPI  Past Medical History:  Diagnosis Date  . Carpal tunnel syndrome    right wrist  . GERD (gastroesophageal reflux disease)   . Hyperlipidemia   . Hypertension 20 yrs  . Raynaud's phenomenon   . Rheumatoid arteritis   . Ulcer [facility])     Past Surgical History:  Procedure Laterality Date  . ABDOMINAL HYSTERECTOMY  1992  . BREAST BIOPSY Right 2011  . BREAST BIOPSY Right November 1998   Dense fibrosis,  benign breast parenchyma.  . CARPAL TUNNEL RELEASE Right 2012  . COLONOSCOPY  2010  . TONSILLECTOMY    . TUBAL LIGATION    . UPPER GI ENDOSCOPY      Family History  Problem Relation Age of Onset  . Breast cancer Sister 14  . Breast cancer Maternal Aunt     Social History Social History  Substance Use Topics  . Smoking status: Current Every Day Smoker    Packs/day: 1.00    Years: 30.00    Types: Cigarettes  . Smokeless tobacco: Never Used  . Alcohol use No    Allergies  Allergen Reactions  . Penicillins Hives    Current Outpatient Prescriptions  Medication Sig Dispense Refill  . amLODipine-benazepril (LOTREL) 5-10 MG per capsule Take 1 capsule by mouth daily.    Marland Kitchen aspirin 81 MG tablet Take 81 mg by mouth daily.    Marland Kitchen gabapentin (NEURONTIN) 300 MG capsule   3  . meloxicam (MOBIC) 15 MG tablet Take 15 mg by mouth as needed.     . methocarbamol (ROBAXIN) 500 MG tablet Take 500 mg by mouth as needed.     . pantoprazole (PROTONIX) 40 MG tablet Take 40 mg by mouth daily.     No current facility-administered medications for this visit.     Review of  Systems Review of Systems  Constitutional: Negative.   Respiratory: Negative.   Cardiovascular: Negative.     Blood pressure 110/66, pulse 76, resp. rate 12, height 5\' 4"  (1.626 m), weight 122 lb (55.3 kg), SpO2 98 %.  Physical Exam Physical Exam  Constitutional: She is oriented to person, place, and time. She appears well-developed and well-nourished.  Eyes: Conjunctivae are normal. No scleral icterus.  Neck: Neck supple.  Cardiovascular: Normal rate, regular rhythm and normal heart sounds.   Pulmonary/Chest: Effort normal and breath sounds normal. Right breast exhibits no inverted nipple, no mass, no nipple discharge, no skin change and no tenderness. Left breast exhibits no inverted nipple, no mass, no nipple discharge, no skin change and no tenderness.  Abdominal: Soft. Bowel sounds are normal. There is no tenderness.  Lymphadenopathy:    She has no cervical adenopathy.    She has no axillary adenopathy.  Neurological: She is alert and oriented to person, place, and time.  Skin: Skin is warm and dry.    Data Reviewed Bilateral mammograms dated 10/07/2015 were reviewed. BI-RADS-1.  Assessment    Benign breast exam.    Plan        Patient will be asked to  return to the office in one year with a bilateral screening mammogram. Patient aware to call with any changes or concerns.   This information has been scribed by Verlene Mayer, CMA.     Robert Bellow 10/15/2015, 8:38 PM

## 2015-10-28 DIAGNOSIS — F17200 Nicotine dependence, unspecified, uncomplicated: Secondary | ICD-10-CM | POA: Diagnosis not present

## 2015-10-28 DIAGNOSIS — F419 Anxiety disorder, unspecified: Secondary | ICD-10-CM | POA: Diagnosis not present

## 2015-10-28 DIAGNOSIS — J3081 Allergic rhinitis due to animal (cat) (dog) hair and dander: Secondary | ICD-10-CM | POA: Diagnosis not present

## 2015-10-28 DIAGNOSIS — J42 Unspecified chronic bronchitis: Secondary | ICD-10-CM | POA: Diagnosis not present

## 2016-03-02 DIAGNOSIS — J399 Disease of upper respiratory tract, unspecified: Secondary | ICD-10-CM | POA: Diagnosis not present

## 2016-03-02 DIAGNOSIS — F17200 Nicotine dependence, unspecified, uncomplicated: Secondary | ICD-10-CM | POA: Diagnosis not present

## 2016-03-02 DIAGNOSIS — J00 Acute nasopharyngitis [common cold]: Secondary | ICD-10-CM | POA: Diagnosis not present

## 2016-03-02 DIAGNOSIS — Z880 Allergy status to penicillin: Secondary | ICD-10-CM | POA: Diagnosis not present

## 2016-03-18 ENCOUNTER — Ambulatory Visit: Payer: Self-pay | Admitting: Physician Assistant

## 2016-03-18 VITALS — BP 100/60 | HR 97 | Temp 98.6°F

## 2016-03-18 DIAGNOSIS — J069 Acute upper respiratory infection, unspecified: Secondary | ICD-10-CM

## 2016-03-18 LAB — POCT INFLUENZA A/B
INFLUENZA A, POC: NEGATIVE
INFLUENZA B, POC: NEGATIVE

## 2016-03-18 MED ORDER — BENZONATATE 200 MG PO CAPS: 200.0000 mg | ORAL_CAPSULE | Freq: Three times a day (TID) | ORAL | 0 refills | Status: DC | PRN

## 2016-03-18 MED ORDER — CEFDINIR 300 MG PO CAPS: 300.0000 mg | ORAL_CAPSULE | Freq: Two times a day (BID) | ORAL | 0 refills | Status: DC

## 2016-03-18 MED ORDER — HYDROCOD POLST-CPM POLST ER 10-8 MG/5ML PO SUER: 5.0000 mL | Freq: Two times a day (BID) | ORAL | 0 refills | Status: DC | PRN

## 2016-03-18 NOTE — Progress Notes (Signed)
S: C/o runny nose and congestion with dry cough for 3 days, +body aches, denies fever, chills, denies cp/sob, v/d; mucus was green this am but clear throughout the day, cough is sporadic,   Using otc meds: robitussin  O: PE: vitals wnl, nad,  perrl eomi, normocephalic, tms dull, nasal mucosa red and swollen, throat injected, neck supple no lymph, lungs c t a, cv rrr, neuro intact, flu swab neg  A:  Acute uri  P: drink fluids, continue regular meds , use otc meds of choice, return if not improving in 5 days, return earlier if worsening  Omnicef, tessalon during the day, tussionex qhs

## 2016-04-03 ENCOUNTER — Ambulatory Visit: Payer: Self-pay | Admitting: Physician Assistant

## 2016-04-03 ENCOUNTER — Encounter: Payer: Self-pay | Admitting: Physician Assistant

## 2016-04-03 VITALS — BP 112/70 | HR 89 | Temp 98.3°F

## 2016-04-03 DIAGNOSIS — K0889 Other specified disorders of teeth and supporting structures: Secondary | ICD-10-CM

## 2016-04-03 MED ORDER — TRAMADOL HCL 50 MG PO TABS
50.0000 mg | ORAL_TABLET | Freq: Three times a day (TID) | ORAL | 0 refills | Status: DC | PRN
Start: 2016-04-03 — End: 2016-11-11

## 2016-04-03 MED ORDER — CEFDINIR 300 MG PO CAPS: 300.0000 mg | ORAL_CAPSULE | Freq: Two times a day (BID) | ORAL | 0 refills | Status: DC

## 2016-04-03 NOTE — Progress Notes (Signed)
S: c/o ear and facial pain, hx of bad dentition, states the pain started a few days ago and is getting worse, no fever/chills, has not seen the dentist has an appt for Monday at 1045  O: vitals wnl, nad, skin intact, no redness, tms clear b/l, nasal mucosa wnl, r mandibular area is tender, + dental caries and fractured tooth on r lower molar, neck supple no lymph, lungs c t a, cv rrr  A: dental abscess  P: omnicef, tramadol 50mg  #15 nr, otc ibuprofen, f/u with dentist on monday

## 2016-06-17 DIAGNOSIS — J00 Acute nasopharyngitis [common cold]: Secondary | ICD-10-CM | POA: Diagnosis not present

## 2016-06-17 DIAGNOSIS — J42 Unspecified chronic bronchitis: Secondary | ICD-10-CM | POA: Diagnosis not present

## 2016-06-17 DIAGNOSIS — F17200 Nicotine dependence, unspecified, uncomplicated: Secondary | ICD-10-CM | POA: Diagnosis not present

## 2016-06-17 DIAGNOSIS — J399 Disease of upper respiratory tract, unspecified: Secondary | ICD-10-CM | POA: Diagnosis not present

## 2016-06-19 ENCOUNTER — Ambulatory Visit
Admission: RE | Admit: 2016-06-19 | Discharge: 2016-06-19 | Disposition: A | Discharge status: Home / Self Care | Payer: 59 | Source: Ambulatory Visit | Attending: Oncology | Admitting: Oncology

## 2016-06-19 ENCOUNTER — Encounter: Payer: Self-pay | Admitting: *Deleted

## 2016-06-19 ENCOUNTER — Ambulatory Visit (HOSPITAL_BASED_OUTPATIENT_CLINIC_OR_DEPARTMENT_OTHER): Payer: 59 | Admitting: Oncology

## 2016-06-19 ENCOUNTER — Telehealth: Payer: Self-pay | Admitting: *Deleted

## 2016-06-19 DIAGNOSIS — Z87891 Personal history of nicotine dependence: Secondary | ICD-10-CM

## 2016-06-19 DIAGNOSIS — Z122 Encounter for screening for malignant neoplasm of respiratory organs: Secondary | ICD-10-CM | POA: Insufficient documentation

## 2016-06-19 DIAGNOSIS — J439 Emphysema, unspecified: Secondary | ICD-10-CM | POA: Insufficient documentation

## 2016-06-19 DIAGNOSIS — F17210 Nicotine dependence, cigarettes, uncomplicated: Secondary | ICD-10-CM | POA: Diagnosis not present

## 2016-06-19 DIAGNOSIS — I700 Atherosclerosis of aorta: Secondary | ICD-10-CM | POA: Diagnosis not present

## 2016-06-19 DIAGNOSIS — I2510 Atherosclerotic heart disease of native coronary artery without angina pectoris: Secondary | ICD-10-CM | POA: Insufficient documentation

## 2016-06-19 NOTE — Telephone Encounter (Signed)
Received self referral for initial lung cancer screening scan. Contacted patient and obtained smoking history,(current, 84 pack year) as well as answering questions related to screening process. Patient denies signs of lung cancer such as weight loss or hemoptysis. Patient denies comorbidity that would prevent curative treatment if lung cancer were found. Patient is scheduled for shared decision making visit and CT scan in near future.

## 2016-06-25 ENCOUNTER — Telehealth: Payer: Self-pay | Admitting: *Deleted

## 2016-06-25 NOTE — Telephone Encounter (Signed)
Notified patient of LDCT lung cancer screening program results with recommendation for 12 month follow up imaging. Also notified of incidental findings noted below and is encouraged to discuss further with PCP who will receive a copy of this note and/or the CT report. Patient verbalizes understanding.   IMPRESSION: 1. Lung-RADS 2, benign appearance or behavior. Continue annual screening with low-dose chest CT without contrast in 12 months. 2. Aortic atherosclerosis (ICD10-170.0). Coronary artery calcification. 3.  Emphysema (ICD10-J43.9). 

## 2016-06-26 ENCOUNTER — Encounter: Payer: Self-pay | Admitting: Physician Assistant

## 2016-06-26 ENCOUNTER — Ambulatory Visit: Payer: Self-pay | Admitting: Physician Assistant

## 2016-06-26 VITALS — BP 102/60 | HR 90 | Temp 97.8°F

## 2016-06-26 DIAGNOSIS — Z87891 Personal history of nicotine dependence: Secondary | ICD-10-CM | POA: Insufficient documentation

## 2016-06-26 DIAGNOSIS — M25571 Pain in right ankle and joints of right foot: Secondary | ICD-10-CM

## 2016-06-26 MED ORDER — DICLOFENAC SODIUM 1 % TD GEL: 4.0000 g | Freq: Four times a day (QID) | TRANSDERMAL | 6 refills | Status: DC

## 2016-06-26 NOTE — Progress Notes (Signed)
In accordance with CMS guidelines, patient has met eligibility criteria including age, absence of signs or symptoms of lung cancer.  Social History  Substance Use Topics  . Smoking status: Current Every Day Smoker    Packs/day: 2.00    Years: 42.00    Types: Cigarettes  . Smokeless tobacco: Never Used  . Alcohol use No     A shared decision-making session was conducted prior to the performance of CT scan. This includes one or more decision aids, includes benefits and harms of screening, follow-up diagnostic testing, over-diagnosis, false positive rate, and total radiation exposure.  Counseling on the importance of adherence to annual lung cancer LDCT screening, impact of co-morbidities, and ability or willingness to undergo diagnosis and treatment is imperative for compliance of the program.  Counseling on the importance of continued smoking cessation for former smokers; the importance of smoking cessation for current smokers, and information about tobacco cessation interventions have been given to patient including [place] and [place] quit [place] programs.  Written order for lung cancer screening with LDCT has been given to the patient and any and all questions have been answered to the best of my abilities.   Yearly follow up will be coordinated by Burgess Estelle, Thoracic Navigator.

## 2016-06-26 NOTE — Progress Notes (Signed)
S: c/o r ankle pain, no known injury but did go bowling a few times over the past 2 weeks, hurts to wear an ankle brace, hx of raynauds so ice makes it hurt  O: vitals wnl, nad, skin intact, no increased warmth at joint, some swelling posteriorly and below distal fibula, full rom, no bony tenderness, n/v intact  A: acute ankle pain  P: voltaren gel, ace wrap applied, f/u with pcp if not better by wed or thurs

## 2016-09-08 ENCOUNTER — Encounter: Payer: Self-pay | Admitting: Physician Assistant

## 2016-09-08 ENCOUNTER — Ambulatory Visit: Payer: Self-pay | Admitting: Physician Assistant

## 2016-09-08 VITALS — BP 94/60 | HR 84 | Temp 97.6°F

## 2016-09-08 DIAGNOSIS — R300 Dysuria: Secondary | ICD-10-CM

## 2016-09-08 LAB — POCT URINALYSIS DIPSTICK
Blood, UA: NEGATIVE
Glucose, UA: NEGATIVE
Leukocytes, UA: NEGATIVE
Nitrite, UA: NEGATIVE
Spec Grav, UA: 1.025
Urobilinogen, UA: 1 U/dL
pH, UA: 5

## 2016-09-08 MED ORDER — CIPROFLOXACIN HCL 250 MG PO TABS
250.0000 mg | ORAL_TABLET | Freq: Two times a day (BID) | ORAL | 0 refills | Status: DC
Start: 2016-09-08 — End: 2016-09-15

## 2016-09-08 NOTE — Progress Notes (Signed)
S:  C/o uti sx for 2 -3 days, burning, urgency, frequency, denies vaginal discharge, abdominal pain or flank pain: does have some mid back pain that aches in one spot, does not radiate, no hx of kidney stones, fam hx of kidney stones;  Remainder ros neg  O:  Vitals wnl, nad, no cva tenderness, back nontender, lungs c t a,cv rrr, abd soft nontender, bs normal, n/v intact  A: uti, dysuria  P: trial of cipro 250mg  bid x 7d, increase water intake, add cranberry juice, return if not improving in 2 -3 days, return earlier if worsening, discussed pyelonephritis sx, pt tor eturn to recheck urine in 1 week due to bili and ketones in current sample

## 2016-09-10 ENCOUNTER — Other Ambulatory Visit: Payer: Self-pay

## 2016-09-10 DIAGNOSIS — Z1231 Encounter for screening mammogram for malignant neoplasm of breast: Secondary | ICD-10-CM

## 2016-09-15 ENCOUNTER — Encounter: Payer: Self-pay | Admitting: Physician Assistant

## 2016-09-15 ENCOUNTER — Ambulatory Visit: Payer: Self-pay | Admitting: Physician Assistant

## 2016-09-15 VITALS — BP 110/60

## 2016-09-15 DIAGNOSIS — Z09 Encounter for follow-up examination after completed treatment for conditions other than malignant neoplasm: Secondary | ICD-10-CM

## 2016-09-15 LAB — POCT URINALYSIS DIPSTICK
Bilirubin, UA: NEGATIVE
Glucose, UA: NEGATIVE
Ketones, UA: NEGATIVE
Leukocytes, UA: NEGATIVE
Nitrite, UA: NEGATIVE
PROTEIN UA: NEGATIVE
RBC UA: NEGATIVE
SPEC GRAV UA: 1.02 (ref 1.010–1.025)
UROBILINOGEN UA: 0.2 U/dL
pH, UA: 6 (ref 5.0–8.0)

## 2016-09-15 NOTE — Progress Notes (Signed)
S: pt here to recheck ua due to recent infection and bilirubin in urine, no problems, has been drinking a lot of water  O: vitals wnl, ua wnl  A: resolved uti  P: f/u prn

## 2016-09-19 ENCOUNTER — Emergency Department: Payer: 59

## 2016-09-19 ENCOUNTER — Emergency Department
Admission: EM | Admit: 2016-09-19 | Discharge: 2016-09-19 | Disposition: A | Discharge status: Home / Self Care | Payer: 59 | Attending: Emergency Medicine | Admitting: Emergency Medicine

## 2016-09-19 DIAGNOSIS — Z7982 Long term (current) use of aspirin: Secondary | ICD-10-CM | POA: Diagnosis not present

## 2016-09-19 DIAGNOSIS — J449 Chronic obstructive pulmonary disease, unspecified: Secondary | ICD-10-CM | POA: Diagnosis not present

## 2016-09-19 DIAGNOSIS — R0789 Other chest pain: Secondary | ICD-10-CM | POA: Diagnosis not present

## 2016-09-19 DIAGNOSIS — I10 Essential (primary) hypertension: Secondary | ICD-10-CM | POA: Diagnosis not present

## 2016-09-19 DIAGNOSIS — M7918 Myalgia, other site: Secondary | ICD-10-CM

## 2016-09-19 DIAGNOSIS — M791 Myalgia: Secondary | ICD-10-CM | POA: Insufficient documentation

## 2016-09-19 DIAGNOSIS — F17210 Nicotine dependence, cigarettes, uncomplicated: Secondary | ICD-10-CM | POA: Diagnosis not present

## 2016-09-19 DIAGNOSIS — R350 Frequency of micturition: Secondary | ICD-10-CM | POA: Diagnosis not present

## 2016-09-19 LAB — URINALYSIS, COMPLETE (UACMP) WITH MICROSCOPIC
BILIRUBIN URINE: NEGATIVE
Bacteria, UA: NONE SEEN
GLUCOSE, UA: NEGATIVE mg/dL
HGB URINE DIPSTICK: NEGATIVE
KETONES UR: 5 mg/dL — AB
LEUKOCYTES UA: NEGATIVE
NITRITE: NEGATIVE
PH: 5 (ref 5.0–8.0)
Protein, ur: NEGATIVE mg/dL
Specific Gravity, Urine: 1.012 (ref 1.005–1.030)

## 2016-09-19 LAB — COMPREHENSIVE METABOLIC PANEL
ALT: 6 U/L — ABNORMAL LOW (ref 14–54)
AST: 17 U/L (ref 15–41)
Albumin: 3.9 g/dL (ref 3.5–5.0)
Alkaline Phosphatase: 89 U/L (ref 38–126)
Anion gap: 11 (ref 5–15)
BUN: 9 mg/dL (ref 6–20)
CHLORIDE: 100 mmol/L — AB (ref 101–111)
CO2: 25 mmol/L (ref 22–32)
CREATININE: 0.69 mg/dL (ref 0.44–1.00)
Calcium: 9.4 mg/dL (ref 8.9–10.3)
Glucose, Bld: 96 mg/dL (ref 65–99)
POTASSIUM: 4.1 mmol/L (ref 3.5–5.1)
SODIUM: 136 mmol/L (ref 135–145)
Total Bilirubin: 0.5 mg/dL (ref 0.3–1.2)
Total Protein: 7.9 g/dL (ref 6.5–8.1)

## 2016-09-19 LAB — CBC
HCT: 44.7 % (ref 35.0–47.0)
Hemoglobin: 15.4 g/dL (ref 12.0–16.0)
MCH: 32 pg (ref 26.0–34.0)
MCHC: 34.5 g/dL (ref 32.0–36.0)
MCV: 92.7 fL (ref 80.0–100.0)
PLATELETS: 471 10*3/uL — AB (ref 150–440)
RBC: 4.82 MIL/uL (ref 3.80–5.20)
RDW: 13 % (ref 11.5–14.5)
WBC: 9.2 10*3/uL (ref 3.6–11.0)

## 2016-09-19 LAB — LIPASE, BLOOD: LIPASE: 19 U/L (ref 11–51)

## 2016-09-19 LAB — TROPONIN I: Troponin I: <0.03 ng/mL (ref <0.03)

## 2016-09-19 MED ORDER — IBUPROFEN 600 MG PO TABS
600.0000 mg | ORAL_TABLET | Freq: Once | ORAL | Status: AC
  Administered 2016-09-19: 600 mg via ORAL
  Filled 2016-09-19: qty 1

## 2016-09-19 MED ORDER — CYCLOBENZAPRINE HCL 10 MG PO TABS
10.0000 mg | ORAL_TABLET | Freq: Once | ORAL | Status: AC
  Administered 2016-09-19: 10 mg via ORAL
  Filled 2016-09-19: qty 1

## 2016-09-19 MED ORDER — IBUPROFEN 600 MG PO TABS: 600.0000 mg | ORAL_TABLET | Freq: Three times a day (TID) | ORAL | 0 refills | Status: DC | PRN

## 2016-09-19 MED ORDER — CYCLOBENZAPRINE HCL 10 MG PO TABS: 10.0000 mg | ORAL_TABLET | Freq: Three times a day (TID) | ORAL | 0 refills | Status: DC | PRN

## 2016-09-19 NOTE — ED Triage Notes (Signed)
Pt came to ED via pov c/o right sided abdominal pain, worsening over past few days. Reports recent kidney infection. Had repeat urine done on Tuesday and was told it was clear. VS stable.

## 2016-09-19 NOTE — ED Notes (Signed)
Patient transported to X-ray 

## 2016-09-19 NOTE — ED Provider Notes (Signed)
[facility] Emergency Department Provider Note ____________________________________________   I have reviewed the triage vital signs and the triage nursing note.  HISTORY  Chief Complaint Abdominal Pain   Historian Patient  HPI Tracey Walters is a 56 y.o. female Presenting for complaint of right-sided chest discomfort. Patient states symptoms have been there for about 3 or 4 days. She states it hurts when she moves her trunk or lifts her arms above her head. 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 She does not report known injury. No skin rash. No fevers. Denies cough or trouble breathing. Denies central left sided chest pain. Denies abdominal pain. States that she has chronic frequency of urination and mild incontinence. States that she had dark urine and was recently treated for possible UTI. She does not feel like she is having symptoms of UTI right now.  recent CT scan the chest for lung cancer screening which was reassuring.      Past Medical History:  Diagnosis Date  . Carpal tunnel syndrome    right wrist  . GERD (gastroesophageal reflux disease)   . Hyperlipidemia   . Hypertension 20 yrs  . Raynaud's phenomenon   . Rheumatoid arteritis   . Ulcer     Patient Active Problem List   Diagnosis Date Noted  . Personal history of tobacco use, presenting hazards to health 06/26/2016  . Breast screening 10/11/2013  . Screening mammogram for high-risk patient 08/31/2012    Past Surgical History:  Procedure Laterality Date  . ABDOMINAL HYSTERECTOMY  1992  . BREAST BIOPSY Right 2011  . BREAST BIOPSY Right November 1998   Dense fibrosis,  benign breast parenchyma.  . CARPAL TUNNEL RELEASE Right 2012  . COLONOSCOPY  2010  . TONSILLECTOMY    . TUBAL LIGATION    . UPPER GI ENDOSCOPY      Prior to Admission medications   Medication Sig Start Date End Date Taking? Authorizing Provider  ALPRAZolam Duanne Moron) 0.25 MG  tablet  03/03/16   [provider]  amLODipine-benazepril (LOTREL) 5-10 MG per capsule Take 1 capsule by mouth daily.    [provider]  aspirin 81 MG tablet Take 81 mg by mouth daily.    [provider]  diclofenac sodium (VOLTAREN) 1 % GEL Apply 4 g topically 4 (four) times daily. 06/26/16   Caryn Section Linden Dolin, PA-C  gabapentin (NEURONTIN) 300 MG capsule  07/23/15   [provider]  pantoprazole (PROTONIX) 40 MG tablet Take 40 mg by mouth daily.    [provider]  traMADol (ULTRAM) 50 MG tablet Take 1 tablet (50 mg total) by mouth every 8 (eight) hours as needed. 04/03/16   Versie Starks, PA-C    Allergies  Allergen Reactions  . Penicillins Hives    Family History  Problem Relation Age of Onset  . Breast cancer Sister 58  . Breast cancer Maternal Aunt     Social History Social History  Substance Use Topics  . Smoking status: Current Every Day Smoker    Packs/day: 2.00    Years: 42.00    Types: Cigarettes  . Smokeless tobacco: Never Used  . Alcohol use No    Review of Systems  Constitutional: Negative for fever. Eyes: Negative for visual changes. ENT: Negative for sore throat. Cardiovascular: right-sided lower rib chest discomfort especially with movement. Respiratory: Negative for shortness of breath. Gastrointestinal: Negative for abdominal pain, vomiting and diarrhea. Genitourinary: as per history of present illness.  Denies hematuria. Musculoskeletal: Negative for back pain. Skin: Negative for rash. Neurological: Negative for headache.  ____________________________________________   PHYSICAL EXAM:  VITAL SIGNS: ED Triage Vitals [09/19/16 1408]  Enc Vitals Group     BP 119/67     Pulse Rate 96     Resp 16     Temp 97.9 F (36.6 C)     Temp Source Oral     SpO2 96 %     Weight 118 lb (53.5 kg)     Height      Head Circumference      Peak Flow      Pain Score      Pain Loc      Pain Edu?      Excl. in [place]?       Constitutional: Alert and oriented. Well appearing and in no distress. HEENT   Head: Normocephalic and atraumatic.      Eyes: Conjunctivae are normal. Pupils equal and round.       Ears:         Nose: No congestion/rhinnorhea.   Mouth/Throat: Mucous membranes are moist.   Neck: No stridor. Cardiovascular/Chest: Normal rate, regular rhythm.  No murmurs, rubs, or gallops.  moderate tenderness to palpation along the rib margin on of the right anterior and lateral ribs just above the last ribs. Respiratory: Normal respiratory effort without tachypnea nor retractions. Breath sounds are clear and equal bilaterally. No wheezes/rales/rhonchi. Gastrointestinal: Soft. No distention, no guarding, no rebound. Nontender.    Genitourinary/rectal:Deferred Musculoskeletal: Nontender with normal range of motion in all extremities. No joint effusions.  No lower extremity tenderness.  No edema. Neurologic:  Normal speech and language. No gross or focal neurologic deficits are appreciated. Skin:  Skin is warm, dry and intact. No rash noted. Psychiatric: Mood and affect are normal. Speech and behavior are normal. Patient exhibits appropriate insight and judgment.   ____________________________________________  LABS (pertinent positives/negatives)  Labs Reviewed  COMPREHENSIVE METABOLIC PANEL - Abnormal; Notable for the following:       Result Value   Chloride 100 (*)    ALT 6 (*)    All other components within normal limits  CBC - Abnormal; Notable for the following:    Platelets 471 (*)    All other components within normal limits  URINALYSIS, COMPLETE (UACMP) WITH MICROSCOPIC - Abnormal; Notable for the following:    Color, Urine YELLOW (*)    APPearance CLEAR (*)    Ketones, ur 5 (*)    Squamous Epithelial / LPF 0-5 (*)    All other components within normal limits  URINE CULTURE  LIPASE, BLOOD  TROPONIN I    ____________________________________________    EKG I, Lisa Roca, MD, the attending physician have personally viewed and interpreted all ECGs.  79 bpm normal sinus rhythm. Narrow QRS. Normal axis. Normal ST and T-wave ____________________________________________  RADIOLOGY All Xrays were viewed by me. Imaging interpreted by Radiologist.  Chest 2 view: IMPRESSION: Bibasilar scarring with underlying COPD changes.  No acute abnormalities. __________________________________________  PROCEDURES  Procedure(s) performed: None  Critical Care performed: None  ____________________________________________   ED COURSE / ASSESSMENT AND PLAN  Pertinent labs & imaging results that were available during my care of the patient were reviewed by me and considered in my medical decision making (see chart for details).   Ms. Terlecki is here more for right sided rib pain that seems pretty musculoskeletal in nature. I discussed with her there is no evidence for shingles right  now but watch for that. She's not complaining of pleuritic chest pain per se or shortness of breath, however she does indicate when she moves her chest wall that she has increased discomfort especially twisting and arm movement. I think PE is unlikely given symptoms. In perk negative. I am adding a chest x-ray.  Triage protocol was aimed at a urinary frequency/abdominal pain. Patient does not really report this as an ongoing acute problem, but sort of a chronic issue that she is not overly concerned with at the moment.  We discussed symptomatic relief with ibuprofen, heat, muscle relaxer, and follow up with primary care physician for consideration of massage/PT referral.  CXR reassuring.  Will recommend close outpatient follow up and conservative remedies for likely musculoskeletal source of pain.    CONSULTATIONS:  None   Patient / Family / Caregiver informed of clinical course, medical decision-making process, and agree with plan.   I discussed return precautions, follow-up  instructions, and discharge instructions with patient and/or family.  Discharge Instructions : You were evaluated for right-sided rib/chest wall discomfort, and although no certain cause was found, and your exam and evaluation are overall reassuring in the emergency department stay.  Return to the emergency room immediately for any worsening symptoms including trouble breathing, shortness of breath, fever, coughing up blood, abdominal pain, skin rash, or any other symptoms concerning to you.  ___________________________________________   FINAL CLINICAL IMPRESSION(S) / ED DIAGNOSES   Final diagnoses:  Musculoskeletal pain              Note: This dictation was prepared with Dragon dictation. Any transcriptional errors that result from this process are unintentional    Lisa Roca, MD 09/19/16 1749

## 2016-09-19 NOTE — Discharge Instructions (Signed)
You were evaluated for right-sided rib/chest wall discomfort, and although no certain cause was found, and your exam and evaluation are overall reassuring in the emergency department stay.  Return to the emergency room immediately for any worsening symptoms including trouble breathing, shortness of breath, fever, coughing up blood, abdominal pain, skin rash, or any other symptoms concerning to you.

## 2016-09-20 LAB — URINE CULTURE

## 2016-11-02 ENCOUNTER — Ambulatory Visit
Admission: RE | Admit: 2016-11-02 | Discharge: 2016-11-02 | Disposition: A | Discharge status: Home / Self Care | Payer: 59 | Source: Ambulatory Visit | Attending: General Surgery | Admitting: General Surgery

## 2016-11-02 DIAGNOSIS — Z1231 Encounter for screening mammogram for malignant neoplasm of breast: Secondary | ICD-10-CM | POA: Insufficient documentation

## 2016-11-11 ENCOUNTER — Ambulatory Visit (INDEPENDENT_AMBULATORY_CARE_PROVIDER_SITE_OTHER): Payer: 59 | Admitting: General Surgery

## 2016-11-11 ENCOUNTER — Encounter: Payer: Self-pay | Admitting: General Surgery

## 2016-11-11 VITALS — BP 110/80 | HR 84 | Resp 12 | Ht 64.0 in | Wt 118.0 lb

## 2016-11-11 DIAGNOSIS — Z86010 Personal history of colonic polyps: Secondary | ICD-10-CM | POA: Diagnosis not present

## 2016-11-11 DIAGNOSIS — Z1231 Encounter for screening mammogram for malignant neoplasm of breast: Secondary | ICD-10-CM

## 2016-11-11 DIAGNOSIS — Z1239 Encounter for other screening for malignant neoplasm of breast: Secondary | ICD-10-CM

## 2016-11-11 NOTE — Progress Notes (Signed)
Patient ID: Tracey Walters, female   DOB: [birth year], 56 y.o.   MRN: [id number]  Chief Complaint  Patient presents with  . Follow-up    HPI Tracey Walters is a 56 y.o. female who presents for a breast evaluation. The most recent mammogram was done on 11/02/2016. Patient does perform regular self breast checks and gets regular mammograms done.   Past due both on screening colonoscopy and Pap smear.  HPI  Past Medical History:  Diagnosis Date  . Carpal tunnel syndrome    right wrist  . GERD (gastroesophageal reflux disease)   . Hyperlipidemia   . Hypertension 20 yrs  . Raynaud's phenomenon   . Rheumatoid arteritis   . Ulcer     Past Surgical History:  Procedure Laterality Date  . ABDOMINAL HYSTERECTOMY  1992  . BREAST BIOPSY Right 2011   neg  . BREAST BIOPSY Right 11/1996   Dense fibrosis,  benign epithelial hyperplasia.  Marland Kitchen CARPAL TUNNEL RELEASE Right 2012  . COLONOSCOPY  2010  . TONSILLECTOMY    . TUBAL LIGATION    . UPPER GI ENDOSCOPY      Family History  Problem Relation Age of Onset  . Breast cancer Sister 74  . Breast cancer Maternal Aunt     Social History Social History  Substance Use Topics  . Smoking status: Current Every Day Smoker    Packs/day: 2.00    Years: 42.00    Types: Cigarettes  . Smokeless tobacco: Never Used  . Alcohol use No    Allergies  Allergen Reactions  . Penicillins Hives    Current Outpatient Prescriptions  Medication Sig Dispense Refill  . ALPRAZolam (XANAX) 0.25 MG tablet   0  . cyclobenzaprine (FLEXERIL) 10 MG tablet Take 1 tablet (10 mg total) by mouth 3 (three) times daily as needed for muscle spasms. 21 tablet 0  . diclofenac sodium (VOLTAREN) 1 % GEL Apply 4 g topically 4 (four) times daily. 100 g 6  . gabapentin (NEURONTIN) 300 MG capsule 300 mg daily as needed.   3  . ibuprofen (ADVIL,MOTRIN) 600 MG tablet Take 1 tablet (600 mg total) by mouth every 8 (eight) hours as needed. 20 tablet 0  . pantoprazole  (PROTONIX) 40 MG tablet Take 40 mg by mouth daily.     No current facility-administered medications for this visit.     Review of Systems Review of Systems  Constitutional: Negative.   Respiratory: Negative.   Cardiovascular: Negative.     Blood pressure 110/80, pulse 84, resp. rate 12, height 5\' 4"  (1.626 m), weight 118 lb (53.5 kg).  Physical Exam Physical Exam  Constitutional: She is oriented to person, place, and time. She appears well-developed and well-nourished.  Eyes: Conjunctivae are normal. No scleral icterus.  Neck: Neck supple.  Cardiovascular: Normal rate, regular rhythm and normal heart sounds.   Pulmonary/Chest: Effort normal and breath sounds normal. Right breast exhibits no inverted nipple, no mass, no nipple discharge, no skin change and no tenderness. Left breast exhibits no inverted nipple, no mass, no nipple discharge, no skin change and no tenderness.  Lymphadenopathy:    She has no cervical adenopathy.    She has no axillary adenopathy.  Neurological: She is alert and oriented to person, place, and time.  Skin: Skin is warm and dry.    Data Reviewed Screening mammogram dated November 02, 2016 reviewed.  Dense breast.  No interval change.  BI-RADS-1.  September 21, 2005 colonoscopy showed a 0.6 cm tubular  adenoma in the rectum at 15 cm.  No subsequent follow-up colonoscopy per patient report.  Assessment    Unremarkable breast exam.    Plan    Patient desires to continue annual screening exams through this office.  Patient will be asked to return to the office in one year with a bilateral screening mammogram.The patient is aware to call back for any questions or concerns.  Colonoscopy with possible biopsy/polypectomy prn: Information regarding the procedure, including its potential risks and complications (including but not limited to perforation of the bowel, which may require emergency surgery to repair, and bleeding) was verbally given to the  patient. Educational information regarding lower intestinal endoscopy was given to the patient. Written instructions for how to complete the bowel prep using Miralax were provided. The importance of drinking ample fluids to avoid dehydration as a result of the prep emphasized.  The patient will notify the office if she desires to proceed with colonoscopy or Pap smear.    HPI, Physical Exam, Assessment and Plan have been scribed under the direction and in the presence of Hervey Ard, MD.  Gaspar Cola, CMA  I have completed the exam and reviewed the above documentation for accuracy and completeness.  I agree with the above.  Haematologist has been used and any errors in dictation or transcription are unintentional.  Hervey Ard, M.D., F.A.C.S.   Robert Bellow 11/11/2016, 11:35 AM

## 2016-11-11 NOTE — Patient Instructions (Signed)
Patient will be asked to return to the office in one year with a bilateral screening mammogram. The patient is aware to call back for any questions or concerns. 

## 2016-11-18 DIAGNOSIS — Z880 Allergy status to penicillin: Secondary | ICD-10-CM | POA: Diagnosis not present

## 2016-11-18 DIAGNOSIS — J42 Unspecified chronic bronchitis: Secondary | ICD-10-CM | POA: Diagnosis not present

## 2016-11-18 DIAGNOSIS — F17200 Nicotine dependence, unspecified, uncomplicated: Secondary | ICD-10-CM | POA: Diagnosis not present

## 2016-11-18 DIAGNOSIS — J3081 Allergic rhinitis due to animal (cat) (dog) hair and dander: Secondary | ICD-10-CM | POA: Diagnosis not present

## 2016-12-23 ENCOUNTER — Ambulatory Visit
Admission: RE | Admit: 2016-12-23 | Discharge: 2016-12-23 | Disposition: A | Discharge status: Home / Self Care | Payer: 59 | Source: Ambulatory Visit | Attending: Physician Assistant | Admitting: Physician Assistant

## 2016-12-23 ENCOUNTER — Ambulatory Visit: Payer: Self-pay | Admitting: Physician Assistant

## 2016-12-23 ENCOUNTER — Encounter: Payer: Self-pay | Admitting: Physician Assistant

## 2016-12-23 VITALS — BP 100/70 | HR 89 | Temp 97.7°F

## 2016-12-23 DIAGNOSIS — S92912A Unspecified fracture of left toe(s), initial encounter for closed fracture: Secondary | ICD-10-CM | POA: Insufficient documentation

## 2016-12-23 DIAGNOSIS — X58XXXA Exposure to other specified factors, initial encounter: Secondary | ICD-10-CM | POA: Insufficient documentation

## 2016-12-23 DIAGNOSIS — M25572 Pain in left ankle and joints of left foot: Secondary | ICD-10-CM

## 2016-12-23 DIAGNOSIS — M79672 Pain in left foot: Secondary | ICD-10-CM | POA: Diagnosis present

## 2016-12-23 DIAGNOSIS — S92515A Nondisplaced fracture of proximal phalanx of left lesser toe(s), initial encounter for closed fracture: Secondary | ICD-10-CM | POA: Diagnosis not present

## 2016-12-23 MED ORDER — NAPROXEN 500 MG PO TABS: 500.0000 mg | ORAL_TABLET | Freq: Two times a day (BID) | ORAL | 0 refills | Status: DC

## 2016-12-23 NOTE — Progress Notes (Signed)
   Subjective: Left foot pain     Patient ID: Tracey Walters, female    DOB: 1960/02/27, 56 y.o.   MRN: [id number]  HPI Patient complaining of pain to the third digit left foot for 2 weeks. Patient state he edema and erythema to the dorsal aspect of the third digit left foot. Patient denies history of trauma. Patient stated pain increases with standing and ambulation. Patient no relief with Tylenol or ibuprofen. Patient denies provocative incident and is concerned she might have gout patient denies family or personal history of gout.   Review of Systems    negative except for complaint Objective:   Physical Exam No obvious deformity to the left foot. There is edema and erythema to the proximal phalange of the third digit left foot. There is no discharge but moderate guarding palpation.       Assessment & Plan:  Patient was sent for an x-ray of the left foot which revealed a nondisplaced fracture third digit left foot. Patient to was buddy taped and she place any open shoe. Patient advised to take medication as needed for swelling and pain. Patient advised to follow-up with her treating doctor in 2 weeks.

## 2017-01-28 DIAGNOSIS — J00 Acute nasopharyngitis [common cold]: Secondary | ICD-10-CM | POA: Diagnosis not present

## 2017-01-28 DIAGNOSIS — Z880 Allergy status to penicillin: Secondary | ICD-10-CM | POA: Diagnosis not present

## 2017-01-28 DIAGNOSIS — S92919A Unspecified fracture of unspecified toe(s), initial encounter for closed fracture: Secondary | ICD-10-CM | POA: Diagnosis not present

## 2017-01-28 DIAGNOSIS — F17200 Nicotine dependence, unspecified, uncomplicated: Secondary | ICD-10-CM | POA: Diagnosis not present

## 2017-04-20 DIAGNOSIS — J3081 Allergic rhinitis due to animal (cat) (dog) hair and dander: Secondary | ICD-10-CM | POA: Diagnosis not present

## 2017-04-20 DIAGNOSIS — J399 Disease of upper respiratory tract, unspecified: Secondary | ICD-10-CM | POA: Diagnosis not present

## 2017-04-20 DIAGNOSIS — J00 Acute nasopharyngitis [common cold]: Secondary | ICD-10-CM | POA: Diagnosis not present

## 2017-04-20 DIAGNOSIS — F17200 Nicotine dependence, unspecified, uncomplicated: Secondary | ICD-10-CM | POA: Diagnosis not present

## 2017-05-18 ENCOUNTER — Encounter: Payer: Self-pay | Admitting: Emergency Medicine

## 2017-05-18 ENCOUNTER — Ambulatory Visit
Admission: EM | Admit: 2017-05-18 | Discharge: 2017-05-18 | Disposition: A | Discharge status: Home / Self Care | Payer: 59 | Attending: Family Medicine | Admitting: Family Medicine

## 2017-05-18 ENCOUNTER — Ambulatory Visit (INDEPENDENT_AMBULATORY_CARE_PROVIDER_SITE_OTHER): Payer: 59

## 2017-05-18 ENCOUNTER — Other Ambulatory Visit: Payer: Self-pay

## 2017-05-18 DIAGNOSIS — M79672 Pain in left foot: Secondary | ICD-10-CM

## 2017-05-18 NOTE — Discharge Instructions (Signed)
Ice. Rest. Drink plenty of fluids.   Follow-up with podiatry in 1 week.  Follow up with your primary care physician this week as needed. Return to Urgent care for new or worsening concerns.

## 2017-05-18 NOTE — ED Triage Notes (Signed)
Patient c/o left foot pain that started couple of days ago. Patient states that pain is on top of her foot.

## 2017-05-18 NOTE — ED Provider Notes (Signed)
[facility] ____________________________________________  Time seen: Approximately 6:20 PM  I have reviewed the triage vital signs and the nursing notes.   HISTORY  Chief Complaint Foot Pain   HPI Tracey Walters is a 57 y.o. female present with family bedside for evaluation of dorsal mid left foot pain present for the last several days.  Patient reports she went running this past week for the first time in the last 40 years, and states immediately after running she had pain to the same area.  States that she does have some break in skin to the area, but reports that was present only after applying icy hot.  Denies any other trauma, injury or fall.  Denies insect bite.  Reports that she has had some swelling to the area.  States painful weightbearing.  Denies other aggravating or alleviating factors.  Reports otherwise feels well.  Denies injuries to the same area in the past. Denies recent sickness. Denies recent antibiotic use.  Patient reports unknown if history of osteoporosis.  Reports she is a smoker.  Cletis Athens, MD: PCP   Past Medical History:  Diagnosis Date  . Carpal tunnel syndrome    right wrist  . GERD (gastroesophageal reflux disease)   . Hyperlipidemia   . Hypertension 20 yrs  . Raynaud's phenomenon   . Rheumatoid arteritis   . Ulcer     Patient Active Problem List   Diagnosis Date Noted  . History of colonic polyps 11/11/2016  . Personal history of tobacco use, presenting hazards to health 06/26/2016  . Breast screening 10/11/2013  . Screening mammogram for high-risk patient 08/31/2012    Past Surgical History:  Procedure Laterality Date  . ABDOMINAL HYSTERECTOMY  1992  . BREAST BIOPSY Right 2011   neg  . BREAST BIOPSY Right 11/1996   Dense fibrosis,  benign epithelial hyperplasia.  Marland Kitchen CARPAL TUNNEL RELEASE Right 2012  . COLONOSCOPY  2010  . TONSILLECTOMY    . TUBAL LIGATION    . UPPER GI ENDOSCOPY       No current  facility-administered medications for this encounter.   Current Outpatient Medications:  .  amlodipine-benazepril (LOTREL) 2.5-10 MG capsule, Take 1 capsule by mouth daily., Disp: , Rfl:  .  gabapentin (NEURONTIN) 300 MG capsule, 300 mg daily as needed. , Disp: , Rfl: 3 .  pantoprazole (PROTONIX) 40 MG tablet, Take 40 mg by mouth daily., Disp: , Rfl:  .  diclofenac sodium (VOLTAREN) 1 % GEL, Apply 4 g topically 4 (four) times daily. (Patient not taking: Reported on 12/23/2016), Disp: 100 g, Rfl: 6 .  ibuprofen (ADVIL,MOTRIN) 600 MG tablet, Take 1 tablet (600 mg total) by mouth every 8 (eight) hours as needed., Disp: 20 tablet, Rfl: 0  Allergies Penicillins  Family History  Problem Relation Age of Onset  . Breast cancer Sister 15  . Breast cancer Maternal Aunt     Social History Social History   Tobacco Use  . Smoking status: Current Every Day Smoker    Packs/day: 2.00    Years: 42.00    Pack years: 84.00    Types: Cigarettes  . Smokeless tobacco: Never Used  Substance Use Topics  . Alcohol use: No  . Drug use: No    Review of Systems Constitutional: No fever/chills Cardiovascular: Denies chest pain. Respiratory: Denies shortness of breath. Gastrointestinal: No abdominal pain.   Musculoskeletal: Negative for back pain. As above.  Skin: Negative for rash. As above.   ____________________________________________   PHYSICAL EXAM:  VITAL SIGNS: ED Triage Vitals  Enc Vitals Group     BP 05/18/17 1718 106/64     Pulse Rate 05/18/17 1718 82     Resp 05/18/17 1718 16     Temp 05/18/17 1718 98 F (36.7 C)     Temp Source 05/18/17 1718 Oral     SpO2 05/18/17 1718 97 %     Weight 05/18/17 1715 112 lb (50.8 kg)     Height 05/18/17 1715 5\' 3"  (1.6 m)     Head Circumference --      Peak Flow --      Pain Score 05/18/17 1715 5     Pain Loc --      Pain Edu? --      Excl. in [place]? --     Constitutional: Alert and oriented. Well appearing and in no acute  distress. Cardiovascular: Normal rate, regular rhythm. Grossly normal heart sounds.  Good peripheral circulation. Respiratory: Normal respiratory effort without tachypnea nor retractions. Breath sounds are clear and equal bilaterally. No wheezes, rales, rhonchi. Musculoskeletal: No midline cervical, thoracic or lumbar tenderness to palpation.  Except: Left dorsal midfoot along proximal first and second metatarsal mild to moderate tenderness to direct palpation and mild localized edema, full range of motion present, normal distal sensation and capillary refill, no motor or tendon deficit noted.  Ambulatory with steady gait. Neurologic:  Normal speech and language. Speech is normal.  Skin:  Skin is warm, dry.  Mild dry scaly skin at proximal dorsal midfoot, no surrounding erythema and no drainage. Psychiatric: Mood and affect are normal. Speech and behavior are normal. Patient exhibits appropriate insight and judgment   ___________________________________________   LABS (all labs ordered are listed, but only abnormal results are displayed)  Labs Reviewed - No data to display ____________________________________________  RADIOLOGY  Dg Foot Complete Left  Result Date: 05/18/2017 CLINICAL DATA:  Foot pain beginning a couple days ago. Pain at top forefoot. EXAM: LEFT FOOT - COMPLETE 3+ VIEW COMPARISON:  None. FINDINGS: Previously noted fracture involving the proximal phalanx in the third digit demonstrates callus formation and interval healing. No acute fracture is present. No focal soft tissue abnormality is present. IMPRESSION: 1. No acute abnormality. 2. Healed fracture involving the proximal phalanx in the third digit. Electronically Signed   By: San Morelle M.D.   On: 05/18/2017 19:06   ____________________________________________   PROCEDURES Procedures    INITIAL IMPRESSION / ASSESSMENT AND PLAN / ED COURSE  Pertinent labs & imaging results that were available during my care  of the patient were reviewed by me and considered in my medical decision making (see chart for details).  Well-appearing patient.  No acute distress.  Concern for stress fracture post running to left foot.  Will evaluate x-ray.  Left foot x-ray reviewed, radiologist report as above, no acute abnormality patella fracture of the third digit.  However reviewed the x-ray myself, and suspect acute fracture to proximal base of first metatarsal.  Patient placed in postoperative shoe and recommend for orthopedic follow-up in 1 week.  Patient states she will take over-the-counter Tylenol and ibuprofen as needed and declined pain medication.  Encourage rest, ice and supportive care.  Discussed follow up with Primary care physician this week. Discussed follow up and return parameters including no resolution or any worsening concerns. Patient verbalized understanding and agreed to plan.   ____________________________________________   FINAL CLINICAL IMPRESSION(S) / ED DIAGNOSES  Final diagnoses:  Left foot pain     ED  Discharge Orders    None       Note: This dictation was prepared with Dragon dictation along with smaller phrase technology. Any transcriptional errors that result from this process are unintentional.         Marylene Land, NP 05/18/17 2013

## 2017-06-01 DIAGNOSIS — M8580 Other specified disorders of bone density and structure, unspecified site: Secondary | ICD-10-CM | POA: Diagnosis not present

## 2017-06-01 DIAGNOSIS — F17200 Nicotine dependence, unspecified, uncomplicated: Secondary | ICD-10-CM | POA: Diagnosis not present

## 2017-06-01 DIAGNOSIS — I7300 Raynaud's syndrome without gangrene: Secondary | ICD-10-CM | POA: Diagnosis not present

## 2017-06-01 DIAGNOSIS — S92902A Unspecified fracture of left foot, initial encounter for closed fracture: Secondary | ICD-10-CM | POA: Diagnosis not present

## 2017-06-09 ENCOUNTER — Other Ambulatory Visit
Admission: RE | Admit: 2017-06-09 | Discharge: 2017-06-09 | Disposition: A | Discharge status: Home / Self Care | Payer: 59 | Source: Ambulatory Visit | Attending: Internal Medicine | Admitting: Internal Medicine

## 2017-06-09 DIAGNOSIS — I7300 Raynaud's syndrome without gangrene: Secondary | ICD-10-CM | POA: Insufficient documentation

## 2017-06-09 DIAGNOSIS — E119 Type 2 diabetes mellitus without complications: Secondary | ICD-10-CM | POA: Insufficient documentation

## 2017-06-09 DIAGNOSIS — I10 Essential (primary) hypertension: Secondary | ICD-10-CM | POA: Insufficient documentation

## 2017-06-09 LAB — BASIC METABOLIC PANEL
Anion gap: 7 (ref 5–15)
BUN: 12 mg/dL (ref 6–20)
CO2: 26 mmol/L (ref 22–32)
Calcium: 8.6 mg/dL — ABNORMAL LOW (ref 8.9–10.3)
Chloride: 103 mmol/L (ref 101–111)
Creatinine, Ser: 0.74 mg/dL (ref 0.44–1.00)
GFR calc Af Amer: >60 mL/min (ref >60)
GFR calc non Af Amer: >60 mL/min (ref >60)
Glucose, Bld: 119 mg/dL — ABNORMAL HIGH (ref 65–99)
Potassium: 4.9 mmol/L (ref 3.5–5.1)
Sodium: 136 mmol/L (ref 135–145)

## 2017-06-09 LAB — LIPID PANEL
Cholesterol: 247 mg/dL — ABNORMAL HIGH (ref 0–200)
HDL: 44 mg/dL (ref >40)
LDL Cholesterol: 179 mg/dL — ABNORMAL HIGH (ref 0–99)
Total CHOL/HDL Ratio: 5.6 RATIO
Triglycerides: 119 mg/dL (ref <150)
VLDL: 24 mg/dL (ref 0–40)

## 2017-06-09 LAB — CBC
HCT: 40.6 % (ref 35.0–47.0)
Hemoglobin: 13.6 g/dL (ref 12.0–16.0)
MCH: 31.5 pg (ref 26.0–34.0)
MCHC: 33.4 g/dL (ref 32.0–36.0)
MCV: 94.4 fL (ref 80.0–100.0)
Platelets: 356 10*3/uL (ref 150–440)
RBC: 4.3 MIL/uL (ref 3.80–5.20)
RDW: 14 % (ref 11.5–14.5)
WBC: 10.3 10*3/uL (ref 3.6–11.0)

## 2017-06-09 LAB — TSH: TSH: 1.698 u[IU]/mL (ref 0.350–4.500)

## 2017-06-10 LAB — VITAMIN D 25 HYDROXY (VIT D DEFICIENCY, FRACTURES): Vit D, 25-Hydroxy: 11.1 ng/mL — ABNORMAL LOW (ref 30.0–100.0)

## 2017-06-15 ENCOUNTER — Other Ambulatory Visit: Payer: Self-pay | Admitting: Internal Medicine

## 2017-06-15 DIAGNOSIS — M8580 Other specified disorders of bone density and structure, unspecified site: Secondary | ICD-10-CM

## 2017-06-21 ENCOUNTER — Telehealth: Payer: Self-pay | Admitting: *Deleted

## 2017-06-21 DIAGNOSIS — Z87891 Personal history of nicotine dependence: Secondary | ICD-10-CM

## 2017-06-21 DIAGNOSIS — Z122 Encounter for screening for malignant neoplasm of respiratory organs: Secondary | ICD-10-CM

## 2017-06-21 NOTE — Telephone Encounter (Signed)
Notified patient that annual lung cancer screening low dose CT scan is due currently or will be in near future. Confirmed that patient is within the age range of 55-77, and asymptomatic, (no signs or symptoms of lung cancer). Patient denies illness that would prevent curative treatment for lung cancer if found. Verified smoking history, (current, 86 pack year). The shared decision making visit was done 06/19/16. Patient is agreeable for CT scan being scheduled.

## 2017-06-22 DIAGNOSIS — M84375A Stress fracture, left foot, initial encounter for fracture: Secondary | ICD-10-CM | POA: Diagnosis not present

## 2017-06-25 ENCOUNTER — Ambulatory Visit
Admission: RE | Admit: 2017-06-25 | Discharge: 2017-06-25 | Disposition: A | Discharge status: Home / Self Care | Payer: 59 | Source: Ambulatory Visit | Attending: Oncology | Admitting: Oncology

## 2017-06-25 DIAGNOSIS — Z87891 Personal history of nicotine dependence: Secondary | ICD-10-CM | POA: Diagnosis not present

## 2017-06-25 DIAGNOSIS — I700 Atherosclerosis of aorta: Secondary | ICD-10-CM | POA: Insufficient documentation

## 2017-06-25 DIAGNOSIS — J439 Emphysema, unspecified: Secondary | ICD-10-CM | POA: Diagnosis not present

## 2017-06-25 DIAGNOSIS — R911 Solitary pulmonary nodule: Secondary | ICD-10-CM | POA: Diagnosis not present

## 2017-06-25 DIAGNOSIS — Z122 Encounter for screening for malignant neoplasm of respiratory organs: Secondary | ICD-10-CM | POA: Diagnosis not present

## 2017-06-28 ENCOUNTER — Telehealth: Payer: Self-pay | Admitting: *Deleted

## 2017-06-28 NOTE — Telephone Encounter (Signed)
Notified patient of LDCT lung cancer screening program results with recommendation for 6 month follow up imaging. Also notified of incidental findings noted below and is encouraged to discuss further with PCP who will receive a copy of this note and/or the CT report. Patient verbalizes understanding.   IMPRESSION: 1. Lung-RADS 3, probably benign findings. Short-term follow-up in 6 months is recommended with repeat low-dose chest CT without contrast (please use the following order, "CT CHEST LCS NODULE FOLLOW-UP W/O CM"). 2.  Emphysema. (ICD10-J43.9) 3.  Aortic Atherosclerois (ICD10-170.0) 

## 2017-07-01 DIAGNOSIS — Z0000 Encounter for general adult medical examination without abnormal findings: Secondary | ICD-10-CM | POA: Diagnosis not present

## 2017-07-22 ENCOUNTER — Ambulatory Visit
Admission: RE | Admit: 2017-07-22 | Discharge: 2017-07-22 | Disposition: A | Discharge status: Home / Self Care | Payer: 59 | Source: Ambulatory Visit | Attending: Internal Medicine | Admitting: Internal Medicine

## 2017-07-22 DIAGNOSIS — M8580 Other specified disorders of bone density and structure, unspecified site: Secondary | ICD-10-CM | POA: Diagnosis not present

## 2017-07-22 DIAGNOSIS — M8589 Other specified disorders of bone density and structure, multiple sites: Secondary | ICD-10-CM | POA: Diagnosis not present

## 2017-07-22 DIAGNOSIS — Z780 Asymptomatic menopausal state: Secondary | ICD-10-CM | POA: Diagnosis not present

## 2017-09-20 ENCOUNTER — Other Ambulatory Visit: Payer: Self-pay

## 2017-09-20 DIAGNOSIS — Z1239 Encounter for other screening for malignant neoplasm of breast: Secondary | ICD-10-CM

## 2017-11-04 ENCOUNTER — Ambulatory Visit: Payer: 59

## 2017-11-11 ENCOUNTER — Ambulatory Visit: Payer: 59 | Admitting: General Surgery

## 2017-11-24 ENCOUNTER — Ambulatory Visit
Admission: RE | Admit: 2017-11-24 | Discharge: 2017-11-24 | Disposition: A | Discharge status: Home / Self Care | Payer: 59 | Source: Ambulatory Visit | Attending: General Surgery | Admitting: General Surgery

## 2017-11-24 DIAGNOSIS — Z1231 Encounter for screening mammogram for malignant neoplasm of breast: Secondary | ICD-10-CM | POA: Diagnosis not present

## 2017-11-24 DIAGNOSIS — Z1239 Encounter for other screening for malignant neoplasm of breast: Secondary | ICD-10-CM | POA: Insufficient documentation

## 2017-11-30 ENCOUNTER — Other Ambulatory Visit: Payer: Self-pay

## 2017-11-30 ENCOUNTER — Ambulatory Visit: Payer: 59 | Admitting: General Surgery

## 2017-11-30 ENCOUNTER — Encounter: Payer: Self-pay | Admitting: General Surgery

## 2017-11-30 VITALS — BP 98/54 | HR 74 | Resp 12 | Ht 63.0 in | Wt 116.0 lb

## 2017-11-30 DIAGNOSIS — Z1239 Encounter for other screening for malignant neoplasm of breast: Secondary | ICD-10-CM

## 2017-11-30 NOTE — Progress Notes (Signed)
Patient ID: Tracey Walters, female   DOB: 26-May-1960, 57 y.o.   MRN: [id number]  Chief Complaint  Patient presents with  . Follow-up    HPI Tracey Walters is a 57 y.o. female.  who presents for a breast evaluation. The most recent mammogram was done on 11-24-17.  Patient does perform regular self breast checks and gets regular mammograms done.  No new breast issues.   HPI  Past Medical History:  Diagnosis Date  . Carpal tunnel syndrome    right wrist  . GERD (gastroesophageal reflux disease)   . Hyperlipidemia   . Hypertension 20 yrs  . Raynaud's phenomenon   . Rheumatoid arteritis ([place])   . Ulcer     Past Surgical History:  Procedure Laterality Date  . ABDOMINAL HYSTERECTOMY  1992  . BREAST BIOPSY Right 2011   neg  . BREAST BIOPSY Right 11/1996   Dense fibrosis,  benign epithelial hyperplasia.  Marland Kitchen CARPAL TUNNEL RELEASE Right 2012  . COLONOSCOPY  2010  . TONSILLECTOMY    . TUBAL LIGATION    . UPPER GI ENDOSCOPY      Family History  Problem Relation Age of Onset  . Breast cancer Sister 54  . Breast cancer Maternal Aunt     Social History Social History   Tobacco Use  . Smoking status: Current Every Day Smoker    Packs/day: 2.00    Years: 42.00    Pack years: 84.00    Types: Cigarettes  . Smokeless tobacco: Never Used  Substance Use Topics  . Alcohol use: No  . Drug use: No    Allergies  Allergen Reactions  . Penicillins Hives    Current Outpatient Medications  Medication Sig Dispense Refill  . amlodipine-benazepril (LOTREL) 2.5-10 MG capsule Take 1 capsule by mouth daily.    . Cholecalciferol (VITAMIN D-3) 125 MCG (5000 UT) TABS Take by mouth daily.    . Cyanocobalamin (VITAMIN B 12 PO) Take 1,000 mg by mouth daily.    Marland Kitchen gabapentin (NEURONTIN) 300 MG capsule 300 mg daily as needed.   3  . ibuprofen (ADVIL,MOTRIN) 600 MG tablet Take 1 tablet (600 mg total) by mouth every 8 (eight) hours as needed. 20 tablet 0  . pantoprazole (PROTONIX) 40 MG  tablet Take 40 mg by mouth daily.     No current facility-administered medications for this visit.     Review of Systems Review of Systems  Constitutional: Negative.   Respiratory: Negative.   Cardiovascular: Negative.     Blood pressure (!) 98/54, pulse 74, resp. rate 12, height 5\' 3"  (1.6 m), weight 116 lb (52.6 kg).  Physical Exam Physical Exam  Constitutional: She is oriented to person, place, and time. She appears well-developed and well-nourished.  HENT:  Mouth/Throat: Oropharynx is clear and moist.  Eyes: Conjunctivae are normal. No scleral icterus.  Neck: Neck supple.  Cardiovascular: Normal rate, regular rhythm and normal heart sounds.  Pulmonary/Chest: Effort normal and breath sounds normal. Right breast exhibits no inverted nipple, no mass, no nipple discharge, no skin change and no tenderness. Left breast exhibits no inverted nipple, no mass, no nipple discharge, no skin change and no tenderness.  Lymphadenopathy:    She has no cervical adenopathy.    She has no axillary adenopathy.  Neurological: She is alert and oriented to person, place, and time.  Skin: Skin is warm and dry.  Psychiatric: Her behavior is normal.    Data Reviewed Bilateral screening mammograms dated November 24, 2017 were  reviewed.  Prominent breast parenchyma.  BI-RADS-1. Bone density testing of July 22, 2017 showed osteopenia. Screening chest CT showed a new 5 mm nodule in the right upper lobe as well as long-standing changes of emphysema.  Six-month follow-up planned.  Assessment    No evidence of breast cancer.  Inadequate calcium intake based on present history.      Plan    Patient will be asked to return to the office in one year with a bilateral screening mammogram. The patient is aware to call back for any questions or new concerns.      HPI, Physical Exam, Assessment and Plan have been scribed under the direction and in the presence of Tracey Bellow, MD. Tracey Fetch,  RN  I have completed the exam and reviewed the above documentation for accuracy and completeness.  I agree with the above.  Haematologist has been used and any errors in dictation or transcription are unintentional.  Tracey Walters, M.D., F.A.C.S.   Tracey Walters 12/01/2017, 5:38 AM

## 2017-11-30 NOTE — Patient Instructions (Addendum)
The patient is aware to call back for any questions or concerns. Patient will be asked to return to the office in one year with a bilateral screening mammogram. 

## 2017-12-01 ENCOUNTER — Telehealth: Payer: Self-pay | Admitting: *Deleted

## 2017-12-01 NOTE — Telephone Encounter (Signed)
Patient called the office back and notified of Dr. Dwyane Luo recommendation based upon last bone density test.   The patient reports that she is taking Vitamin D 5000 units daily.   She reports she is not taking calcium but was instructed to start at least 1200 mg of calcium daily. Patient verbalizes understanding.

## 2017-12-01 NOTE — Telephone Encounter (Signed)
-----   Message from Robert Bellow, MD sent at 12/01/2017  5:40 AM EST ----- Patient needs to be taking at least 1200 mg of calcium and vitamin D daily.  I do not see that she is taking any calcium based on yesterday's medication list.  She had osteopenia on her July bone density exam.

## 2017-12-02 DIAGNOSIS — J42 Unspecified chronic bronchitis: Secondary | ICD-10-CM | POA: Diagnosis not present

## 2017-12-02 DIAGNOSIS — S92902A Unspecified fracture of left foot, initial encounter for closed fracture: Secondary | ICD-10-CM | POA: Diagnosis not present

## 2017-12-02 DIAGNOSIS — R911 Solitary pulmonary nodule: Secondary | ICD-10-CM | POA: Diagnosis not present

## 2017-12-02 DIAGNOSIS — Z720 Tobacco use: Secondary | ICD-10-CM | POA: Diagnosis not present

## 2017-12-16 ENCOUNTER — Telehealth: Payer: Self-pay | Admitting: *Deleted

## 2017-12-16 DIAGNOSIS — Z122 Encounter for screening for malignant neoplasm of respiratory organs: Secondary | ICD-10-CM

## 2017-12-16 DIAGNOSIS — Z87891 Personal history of nicotine dependence: Secondary | ICD-10-CM

## 2017-12-16 DIAGNOSIS — R918 Other nonspecific abnormal finding of lung field: Secondary | ICD-10-CM

## 2017-12-16 NOTE — Telephone Encounter (Signed)
Patient is aware of LCS nodule follow up scan being due soon. She is agreeable with having scan scheduled.

## 2017-12-27 ENCOUNTER — Ambulatory Visit
Admission: RE | Admit: 2017-12-27 | Discharge: 2017-12-27 | Disposition: A | Discharge status: Home / Self Care | Payer: 59 | Source: Ambulatory Visit | Attending: Oncology | Admitting: Oncology

## 2017-12-27 DIAGNOSIS — F17200 Nicotine dependence, unspecified, uncomplicated: Secondary | ICD-10-CM | POA: Diagnosis not present

## 2017-12-27 DIAGNOSIS — R911 Solitary pulmonary nodule: Secondary | ICD-10-CM | POA: Diagnosis not present

## 2017-12-27 DIAGNOSIS — Z87891 Personal history of nicotine dependence: Secondary | ICD-10-CM | POA: Diagnosis not present

## 2017-12-27 DIAGNOSIS — R918 Other nonspecific abnormal finding of lung field: Secondary | ICD-10-CM | POA: Diagnosis not present

## 2017-12-27 DIAGNOSIS — Z122 Encounter for screening for malignant neoplasm of respiratory organs: Secondary | ICD-10-CM | POA: Diagnosis not present

## 2017-12-28 ENCOUNTER — Telehealth: Payer: Self-pay | Admitting: *Deleted

## 2017-12-28 ENCOUNTER — Other Ambulatory Visit: Payer: Self-pay | Admitting: Oncology

## 2017-12-28 DIAGNOSIS — R918 Other nonspecific abnormal finding of lung field: Secondary | ICD-10-CM

## 2017-12-28 NOTE — Progress Notes (Signed)
Order placed for PET Scan.  Spoke with Shawn lung navigator who will contact patient.   Faythe Casa, NP 12/28/2017 11:36 AM

## 2017-12-28 NOTE — Telephone Encounter (Signed)
Notified patient of lung screening results noted below. After discussion with Rulon Abide NP, and Thoracic Nurse Navigator, plan for PET scan and PFT's ordered.   IMPRESSION: Lung-RADS 4B, suspicious. Additional imaging evaluation or consultation with Pulmonology or Thoracic Surgery recommended.  8.6 mm nodule inferiorly in the right upper lobe, increased, suspicious for primary bronchogenic neoplasm. Discussion at multidisciplinary tumor board is suggested. Consider PET-CT for further evaluation as clinically warranted.  Aortic Atherosclerosis (ICD10-I70.0) and Emphysema (ICD10-J43.9).

## 2017-12-29 ENCOUNTER — Encounter
Admission: RE | Admit: 2017-12-29 | Discharge: 2017-12-29 | Disposition: A | Discharge status: Home / Self Care | Payer: 59 | Source: Ambulatory Visit | Attending: Oncology | Admitting: Oncology

## 2017-12-29 ENCOUNTER — Other Ambulatory Visit: Payer: Self-pay | Admitting: *Deleted

## 2017-12-29 DIAGNOSIS — R918 Other nonspecific abnormal finding of lung field: Secondary | ICD-10-CM

## 2017-12-29 DIAGNOSIS — R911 Solitary pulmonary nodule: Secondary | ICD-10-CM | POA: Diagnosis not present

## 2017-12-29 LAB — GLUCOSE, CAPILLARY: Glucose-Capillary: 94 mg/dL (ref 70–99)

## 2017-12-29 MED ORDER — FLUDEOXYGLUCOSE F - 18 (FDG) INJECTION
6.5330 | Freq: Once | INTRAVENOUS | Status: AC | PRN
  Administered 2017-12-29: 6.533 via INTRAVENOUS

## 2017-12-30 ENCOUNTER — Other Ambulatory Visit: Payer: Self-pay | Admitting: *Deleted

## 2017-12-30 DIAGNOSIS — R911 Solitary pulmonary nodule: Secondary | ICD-10-CM

## 2017-12-31 ENCOUNTER — Ambulatory Visit
Admission: RE | Admit: 2017-12-31 | Discharge: 2017-12-31 | Disposition: A | Discharge status: Home / Self Care | Payer: 59 | Source: Ambulatory Visit | Attending: Oncology | Admitting: Oncology

## 2017-12-31 DIAGNOSIS — R911 Solitary pulmonary nodule: Secondary | ICD-10-CM | POA: Diagnosis not present

## 2017-12-31 DIAGNOSIS — R918 Other nonspecific abnormal finding of lung field: Secondary | ICD-10-CM | POA: Diagnosis not present

## 2017-12-31 LAB — POCT I-STAT CREATININE: Creatinine, Ser: 0.7 mg/dL (ref 0.44–1.00)

## 2017-12-31 MED ORDER — IOPAMIDOL (ISOVUE-300) INJECTION 61%
60.0000 mL | Freq: Once | INTRAVENOUS | Status: AC | PRN
  Administered 2017-12-31: 60 mL via INTRAVENOUS

## 2018-01-06 ENCOUNTER — Encounter: Payer: Self-pay | Admitting: Pulmonary Disease

## 2018-01-06 ENCOUNTER — Ambulatory Visit: Payer: 59 | Attending: Oncology

## 2018-01-06 ENCOUNTER — Ambulatory Visit: Payer: 59 | Admitting: Pulmonary Disease

## 2018-01-06 VITALS — BP 110/58 | HR 86 | Ht 63.0 in | Wt 115.4 lb

## 2018-01-06 DIAGNOSIS — R590 Localized enlarged lymph nodes: Secondary | ICD-10-CM

## 2018-01-06 DIAGNOSIS — F17210 Nicotine dependence, cigarettes, uncomplicated: Secondary | ICD-10-CM | POA: Diagnosis not present

## 2018-01-06 DIAGNOSIS — R911 Solitary pulmonary nodule: Secondary | ICD-10-CM

## 2018-01-06 MED ORDER — ALBUTEROL SULFATE (2.5 MG/3ML) 0.083% IN NEBU
2.5000 mg | INHALATION_SOLUTION | Freq: Once | RESPIRATORY_TRACT | Status: AC
  Administered 2018-01-06: 2.5 mg via RESPIRATORY_TRACT
  Filled 2018-01-06: qty 3

## 2018-01-06 NOTE — H&P (View-Only) (Signed)
Subjective:    Patient ID: Tracey Walters, female    DOB: Nov 17, 1960, 57 y.o.   MRN: [id number]  HPI Patient is a 57 year old current smoker (2 PPD) who presents for evaluation of abnormal lung cancer screening CT.  She is kindly referred by Burgess Estelle, NP.  Her initial low-dose cancer screening was performed in June 2018 and at that time no significant abnormalities were noted and a yearly screening was recommended.  She then had a follow-up in June 2019 that showed a 5.2 mm right upper lobe nodule and short interval follow-up was recommended.  This was performed in December 2019 and by this time the nodule had increased to 8.6 mm.  This was followed by PET/CT which showed some minimal uptake on the nodule however the patient did have right hilar adenopathy which was difficult to characterize but was FDG avid.  Subsequently she had a CT scan with contrast that showed that this right hilar node is approximately 2.3 cm in size.  Unfortunately because of the number of imaging studies that have been performed we will not be able to perform navigational bronchoscopy as it would need a dedicated CT for the same.  The DICOM data from the last CT performed on 20 December has already been purged.  DICOM data is what is necessary to make a map for navigational bronchoscopy.  However the right hilar mediastinal node is amenable to biopsy by endobronchial ultrasound.  I have reviewed all of the films independently and have reviewed them with the patient.  Other findings on CT were those of centrilobular emphysema.  The patient has not had any fevers, chills or sweats.  She does not describe any weight loss.  No appetite loss.  She has morning cough productive of clear sputum but no hemoptysis.  No purulent sputum.  Her only complaint is that of gastroesophageal reflux and heartburn on occasion.  Overall she considers herself healthy.  Past medical history, surgical history and family history have been  reviewed.  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  She has been smoking 2 packs of cigarettes per day since age 38.  She has 4 dogs in the home but no exotic pets.  No military experience.   Review of Systems  Constitutional: Negative.   HENT: Negative.   Eyes: Negative.   Respiratory: Positive for cough.   Cardiovascular: Negative.   Gastrointestinal:       Heartburn  Endocrine: Negative.   Genitourinary: Negative.   Musculoskeletal: Negative.   Skin: Negative.   Allergic/Immunologic: Negative.   Neurological: Negative.   Hematological: Negative.   Psychiatric/Behavioral: Negative.   All other systems reviewed and are negative.      Objective:   Physical Exam Vitals signs and nursing note reviewed.  Constitutional:      Appearance: Normal appearance. She is normal weight.  HENT:     Head: Normocephalic and atraumatic.     Right Ear: External ear normal.     Left Ear: External ear normal.     Nose: Nose normal.     Mouth/Throat:     Mouth: Mucous membranes are moist.     Pharynx: No oropharyngeal exudate or posterior oropharyngeal erythema.     Comments: Mallampati class I.  Poor dentition. Eyes:     General: No scleral icterus.    Extraocular Movements: Extraocular movements intact.     Pupils: Pupils are equal, round, and reactive to light.  Neck:  Musculoskeletal: Normal range of motion and neck supple.  Cardiovascular:     Rate and Rhythm: Normal rate and regular rhythm.     Pulses: Normal pulses.     Heart sounds: Normal heart sounds.  Pulmonary:     Effort: Pulmonary effort is normal.     Breath sounds: Normal breath sounds.  Abdominal:     General: Abdomen is flat. Bowel sounds are normal.     Palpations: Abdomen is soft.  Musculoskeletal: Normal range of motion.     Right lower leg: No edema.     Left lower leg: No edema.  Lymphadenopathy:     Cervical: No cervical adenopathy.  Skin:    General: Skin is warm and  dry.  Neurological:     General: No focal deficit present.     Mental Status: She is alert and oriented to person, place, and time.  Psychiatric:        Mood and Affect: Mood normal.        Behavior: Behavior normal.           Assessment & Plan:   1.  Right hilar adenopathy, FDG avid: This is carcinoma until proven otherwise.  By the most recent CT this appears to be approximately a 2-1/2 cm lymph node located in the right hilum.  This is amenable to biopsy by endobronchial ultrasound. The patient understands the potential benefits, limitations and complications of procedure and agrees to go ahead.  She understands that she will need general anesthesia for the same.  Procedure is  tentatively scheduled for 14 January.  2.  Right upper lobe nodule, enlarging, low FDG activity: Potentially this could be carcinoma.  It would be amenable to biopsy by navigational bronchoscopy however the patient has had imaging already performed and patient will imaging will not be proved by insurance.  This CT would have to have all the DICOM data available.  We attempted to recover the DICOM data from the CT performed on 20 December however this had already been purged.  We will have to proceed with endobronchial ultrasound as suspect that the right hilar adenopathy and the lung nodule are related.  3.  Tobacco dependence due to cigarettes: Patient was counseled regards to discontinuation of smoking total time of counseling 3 to 5 minutes.   Thank you for allowing [place] to participate in this patient's care.

## 2018-01-06 NOTE — Patient Instructions (Signed)
1.  We will schedule you for bronchoscopy with endobronchial ultrasound on 14 January.  2.  We will see him in follow-up 2 weeks after that.

## 2018-01-06 NOTE — Progress Notes (Signed)
Subjective:    Patient ID: Tracey Walters, female    DOB: 07/07/1960, 57 y.o.   MRN: [id number]  HPI Patient is a 57 year old current smoker (2 PPD) who presents for evaluation of abnormal lung cancer screening CT.  She is kindly referred by Burgess Estelle, NP.  Her initial low-dose cancer screening was performed in June 2018 and at that time no significant abnormalities were noted and a yearly screening was recommended.  She then had a follow-up in June 2019 that showed a 5.2 mm right upper lobe nodule and short interval follow-up was recommended.  This was performed in December 2019 and by this time the nodule had increased to 8.6 mm.  This was followed by PET/CT which showed some minimal uptake on the nodule however the patient did have right hilar adenopathy which was difficult to characterize but was FDG avid.  Subsequently she had a CT scan with contrast that showed that this right hilar node is approximately 2.3 cm in size.  Unfortunately because of the number of imaging studies that have been performed we will not be able to perform navigational bronchoscopy as it would need a dedicated CT for the same.  The DICOM data from the last CT performed on 20 December has already been purged.  DICOM data is what is necessary to make a map for navigational bronchoscopy.  However the right hilar mediastinal node is amenable to biopsy by endobronchial ultrasound.  I have reviewed all of the films independently and have reviewed them with the patient.  Other findings on CT were those of centrilobular emphysema.  The patient has not had any fevers, chills or sweats.  She does not describe any weight loss.  No appetite loss.  She has morning cough productive of clear sputum but no hemoptysis.  No purulent sputum.  Her only complaint is that of gastroesophageal reflux and heartburn on occasion.  Overall she considers herself healthy.  Past medical history, surgical history and family history have been  reviewed.  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  She has been smoking 2 packs of cigarettes per day since age 33.  She has 4 dogs in the home but no exotic pets.  No military experience.   Review of Systems  Constitutional: Negative.   HENT: Negative.   Eyes: Negative.   Respiratory: Positive for cough.   Cardiovascular: Negative.   Gastrointestinal:       Heartburn  Endocrine: Negative.   Genitourinary: Negative.   Musculoskeletal: Negative.   Skin: Negative.   Allergic/Immunologic: Negative.   Neurological: Negative.   Hematological: Negative.   Psychiatric/Behavioral: Negative.   All other systems reviewed and are negative.      Objective:   Physical Exam Vitals signs and nursing note reviewed.  Constitutional:      Appearance: Normal appearance. She is normal weight.  HENT:     Head: Normocephalic and atraumatic.     Right Ear: External ear normal.     Left Ear: External ear normal.     Nose: Nose normal.     Mouth/Throat:     Mouth: Mucous membranes are moist.     Pharynx: No oropharyngeal exudate or posterior oropharyngeal erythema.     Comments: Mallampati class I.  Poor dentition. Eyes:     General: No scleral icterus.    Extraocular Movements: Extraocular movements intact.     Pupils: Pupils are equal, round, and reactive to light.  Neck:  Musculoskeletal: Normal range of motion and neck supple.  Cardiovascular:     Rate and Rhythm: Normal rate and regular rhythm.     Pulses: Normal pulses.     Heart sounds: Normal heart sounds.  Pulmonary:     Effort: Pulmonary effort is normal.     Breath sounds: Normal breath sounds.  Abdominal:     General: Abdomen is flat. Bowel sounds are normal.     Palpations: Abdomen is soft.  Musculoskeletal: Normal range of motion.     Right lower leg: No edema.     Left lower leg: No edema.  Lymphadenopathy:     Cervical: No cervical adenopathy.  Skin:    General: Skin is warm and  dry.  Neurological:     General: No focal deficit present.     Mental Status: She is alert and oriented to person, place, and time.  Psychiatric:        Mood and Affect: Mood normal.        Behavior: Behavior normal.           Assessment & Plan:   1.  Right hilar adenopathy, FDG avid: This is carcinoma until proven otherwise.  By the most recent CT this appears to be approximately a 2-1/2 cm lymph node located in the right hilum.  This is amenable to biopsy by endobronchial ultrasound. The patient understands the potential benefits, limitations and complications of procedure and agrees to go ahead.  She understands that she will need general anesthesia for the same.  Procedure is  tentatively scheduled for 14 January.  2.  Right upper lobe nodule, enlarging, low FDG activity: Potentially this could be carcinoma.  It would be amenable to biopsy by navigational bronchoscopy however the patient has had imaging already performed and patient will imaging will not be proved by insurance.  This CT would have to have all the DICOM data available.  We attempted to recover the DICOM data from the CT performed on 20 December however this had already been purged.  We will have to proceed with endobronchial ultrasound as suspect that the right hilar adenopathy and the lung nodule are related.  3.  Tobacco dependence due to cigarettes: Patient was counseled regards to discontinuation of smoking total time of counseling 3 to 5 minutes.   Thank you for allowing [place] to participate in this patient's care.

## 2018-01-07 ENCOUNTER — Telehealth: Payer: Self-pay

## 2018-01-07 NOTE — Telephone Encounter (Signed)
Procedure: EBUS Date: 01/25/18 @ 1300 DX: right upper lobe nodule CPT: 23343, [phone]

## 2018-01-13 NOTE — Telephone Encounter (Signed)
01/13/2018 at 1:45 pm EST spoke with Terri at [facility].  [id number] and [phone] are valid and billable codes which does not require PA. Call Ref # [id number]. Rhonda J Cobb

## 2018-01-14 ENCOUNTER — Encounter: Payer: Self-pay | Admitting: Pulmonary Disease

## 2018-01-20 ENCOUNTER — Encounter
Admission: RE | Admit: 2018-01-20 | Discharge: 2018-01-20 | Disposition: A | Discharge status: Home / Self Care | Payer: 59 | Source: Ambulatory Visit | Attending: Pulmonary Disease | Admitting: Pulmonary Disease

## 2018-01-20 ENCOUNTER — Other Ambulatory Visit: Payer: Self-pay

## 2018-01-20 ENCOUNTER — Encounter: Payer: Self-pay | Admitting: *Deleted

## 2018-01-20 DIAGNOSIS — I10 Essential (primary) hypertension: Secondary | ICD-10-CM | POA: Diagnosis not present

## 2018-01-20 DIAGNOSIS — Z01818 Encounter for other preprocedural examination: Secondary | ICD-10-CM | POA: Insufficient documentation

## 2018-01-20 LAB — BASIC METABOLIC PANEL
ANION GAP: 10 (ref 5–15)
BUN: 10 mg/dL (ref 6–20)
CALCIUM: 10.2 mg/dL (ref 8.9–10.3)
CHLORIDE: 100 mmol/L (ref 98–111)
CO2: 28 mmol/L (ref 22–32)
CREATININE: 0.67 mg/dL (ref 0.44–1.00)
GFR calc non Af Amer: >60 mL/min (ref >60)
Glucose, Bld: 87 mg/dL (ref 70–99)
Potassium: 3.4 mmol/L — ABNORMAL LOW (ref 3.5–5.1)
SODIUM: 138 mmol/L (ref 135–145)

## 2018-01-20 LAB — CBC
HCT: 44.9 % (ref 36.0–46.0)
Hemoglobin: 14.9 g/dL (ref 12.0–15.0)
MCH: 31.8 pg (ref 26.0–34.0)
MCHC: 33.2 g/dL (ref 30.0–36.0)
MCV: 95.7 fL (ref 80.0–100.0)
NRBC: 0 % (ref 0.0–0.2)
PLATELETS: 353 10*3/uL (ref 150–400)
RBC: 4.69 MIL/uL (ref 3.87–5.11)
RDW: 13.2 % (ref 11.5–15.5)
WBC: 10.9 10*3/uL — AB (ref 4.0–10.5)

## 2018-01-20 NOTE — Patient Instructions (Signed)
Your procedure is scheduled on: 01/25/17 Tues Report to Same Day Surgery 2nd floor medical mall [facility] Entrance-take elevator on left to 2nd floor.  Check in with surgery information desk.) To find out your arrival time please call [phone] between 1PM - 3PM on 01/24/17 Mon  Remember: Instructions that are not followed completely may result in serious medical risk, up to and including death, or upon the discretion of your surgeon and anesthesiologist your surgery may need to be rescheduled.    _x___ 1. Do not eat food after midnight the night before your procedure. You may drink clear liquids up to 2 hours before you are scheduled to arrive at the hospital for your procedure.  Do not drink clear liquids within 2 hours of your scheduled arrival to the hospital.  Clear liquids include  --Water or Apple juice without pulp  --Clear carbohydrate beverage such as ClearFast or Gatorade  --Black Coffee or Clear Tea (No milk, no creamers, do not add anything to                  the coffee or Tea Type 1 and type 2 diabetics should only drink water.   ____Ensure clear carbohydrate drink on the way to the hospital for bariatric patients  ____Ensure clear carbohydrate drink 3 hours before surgery for Dr Dwyane Luo patients if physician instructed.   No gum chewing or hard candies.     __x__ 2. No Alcohol for 24 hours before or after surgery.   __x__3. No Smoking or e-cigarettes for 24 prior to surgery.  Do not use any chewable tobacco products for at least 6 hour prior to surgery   ____  4. Bring all medications with you on the day of surgery if instructed.    __x__ 5. Notify your doctor if there is any change in your medical condition     (cold, fever, infections).    x___6. On the morning of surgery brush your teeth with toothpaste and water.  You may rinse your mouth with mouth wash if you wish.  Do not swallow any toothpaste or mouthwash.   Do not wear jewelry, make-up, hairpins,  clips or nail polish.  Do not wear lotions, powders, or perfumes. You may wear deodorant.  Do not shave 48 hours prior to surgery. Men may shave face and neck.  Do not bring valuables to the hospital.    [facility] is not responsible for any belongings or valuables.               Contacts, dentures or bridgework may not be worn into surgery.  Leave your suitcase in the car. After surgery it may be brought to your room.  For patients admitted to the hospital, discharge time is determined by your                       treatment team.  _  Patients discharged the day of surgery will not be allowed to drive home.  You will need someone to drive you home and stay with you the night of your procedure.    Please read over the following fact sheets that you were given:   [facility] Preparing for Surgery and or MRSA Information   _x___ Take anti-hypertensive listed below, cardiac, seizure, asthma,     anti-reflux and psychiatric medicines. These include:  1. pantoprazole (PROTONIX) 40 MG tablet  2.  3.  4.  5.  6.  ____Fleets enema or  Magnesium Citrate as directed.   _x___ Use CHG Soap or sage wipes as directed on instruction sheet   ____ Use inhalers on the day of surgery and bring to hospital day of surgery  ____ Stop Metformin and Janumet 2 days prior to surgery.    ____ Take 1/2 of usual insulin dose the night before surgery and none on the morning     surgery.   _x___ Follow recommendations from Cardiologist, Pulmonologist or PCP regarding          stopping Aspirin, Coumadin, Plavix ,Eliquis, Effient, or Pradaxa, and Pletal.  X____Stop Anti-inflammatories such as Advil, Aleve, Ibuprofen, Motrin, Naproxen, Naprosyn, Goodies powders or aspirin products. OK to take Tylenol and                          Celebrex.   _x___ Stop supplements until after surgery.  But may continue Vitamin D, Vitamin B,       and multivitamin.   ____ Bring C-Pap to the hospital.

## 2018-01-20 NOTE — Patient Instructions (Signed)
Your procedure is scheduled on: 01/25/18 Tues Report to Same Day Surgery 2nd floor medical mall [facility] Entrance-take elevator on left to 2nd floor.  Check in with surgery information desk.) To find out your arrival time please call [phone] between 1PM - 3PM on 01/24/18 Mon  Remember: Instructions that are not followed completely may result in serious medical risk, up to and including death, or upon the discretion of your surgeon and anesthesiologist your surgery may need to be rescheduled.    _x___ 1. Do not eat food after midnight the night before your procedure. You may drink clear liquids up to 2 hours before you are scheduled to arrive at the hospital for your procedure.  Do not drink clear liquids within 2 hours of your scheduled arrival to the hospital.  Clear liquids include  --Water or Apple juice without pulp  --Clear carbohydrate beverage such as ClearFast or Gatorade  --Black Coffee or Clear Tea (No milk, no creamers, do not add anything to                  the coffee or Tea Type 1 and type 2 diabetics should only drink water.   ____Ensure clear carbohydrate drink on the way to the hospital for bariatric patients  ____Ensure clear carbohydrate drink 3 hours before surgery for Dr Dwyane Luo patients if physician instructed.   No gum chewing or hard candies.     __x__ 2. No Alcohol for 24 hours before or after surgery.   __x__3. No Smoking or e-cigarettes for 24 prior to surgery.  Do not use any chewable tobacco products for at least 6 hour prior to surgery   ____  4. Bring all medications with you on the day of surgery if instructed.    __x__ 5. Notify your doctor if there is any change in your medical condition     (cold, fever, infections).    x___6. On the morning of surgery brush your teeth with toothpaste and water.  You may rinse your mouth with mouth wash if you wish.  Do not swallow any toothpaste or mouthwash.   Do not wear jewelry, make-up, hairpins,  clips or nail polish.  Do not wear lotions, powders, or perfumes. You may wear deodorant.  Do not shave 48 hours prior to surgery. Men may shave face and neck.  Do not bring valuables to the hospital.    [facility] is not responsible for any belongings or valuables.               Contacts, dentures or bridgework may not be worn into surgery.  Leave your suitcase in the car. After surgery it may be brought to your room.  For patients admitted to the hospital, discharge time is determined by your                       treatment team.  _  Patients discharged the day of surgery will not be allowed to drive home.  You will need someone to drive you home and stay with you the night of your procedure.    Please read over the following fact sheets that you were given:   [facility] Preparing for Surgery and or MRSA Information   _x___ Take anti-hypertensive listed below, cardiac, seizure, asthma,     anti-reflux and psychiatric medicines. These include:  1. pantoprazole (PROTONIX) 40 MG tablet  2.  3.  4.  5.  6.  ____Fleets enema or  Magnesium Citrate as directed.   ___ Use CHG Soap or sage wipes as directed on instruction sheet   ____ Use inhalers on the day of surgery and bring to hospital day of surgery  ____ Stop Metformin and Janumet 2 days prior to surgery.    ____ Take 1/2 of usual insulin dose the night before surgery and none on the morning     surgery.   _x___ Follow recommendations from Cardiologist, Pulmonologist or PCP regarding          stopping Aspirin, Coumadin, Plavix ,Eliquis, Effient, or Pradaxa, and Pletal.  X____Stop Anti-inflammatories such as Advil, Aleve, Ibuprofen, Motrin, Naproxen, Naprosyn, Goodies powders or aspirin products. OK to take Tylenol and                          Celebrex.   _x___ Stop supplements until after surgery.  But may continue Vitamin D, Vitamin B,       and multivitamin.   ____ Bring C-Pap to the hospital.

## 2018-01-25 ENCOUNTER — Ambulatory Visit
Admission: RE | Admit: 2018-01-25 | Discharge: 2018-01-25 | Disposition: A | Discharge status: Home / Self Care | Payer: 59 | Attending: Pulmonary Disease | Admitting: Pulmonary Disease

## 2018-01-25 ENCOUNTER — Ambulatory Visit: Payer: 59 | Admitting: Anesthesiology

## 2018-01-25 ENCOUNTER — Encounter
Admission: RE | Disposition: A | Discharge status: Home / Self Care | Payer: Self-pay | Source: Home / Self Care | Attending: Pulmonary Disease

## 2018-01-25 ENCOUNTER — Other Ambulatory Visit: Payer: Self-pay

## 2018-01-25 DIAGNOSIS — R911 Solitary pulmonary nodule: Secondary | ICD-10-CM | POA: Diagnosis not present

## 2018-01-25 DIAGNOSIS — K219 Gastro-esophageal reflux disease without esophagitis: Secondary | ICD-10-CM | POA: Insufficient documentation

## 2018-01-25 DIAGNOSIS — E785 Hyperlipidemia, unspecified: Secondary | ICD-10-CM | POA: Diagnosis not present

## 2018-01-25 DIAGNOSIS — I10 Essential (primary) hypertension: Secondary | ICD-10-CM | POA: Diagnosis not present

## 2018-01-25 DIAGNOSIS — F17210 Nicotine dependence, cigarettes, uncomplicated: Secondary | ICD-10-CM | POA: Diagnosis not present

## 2018-01-25 DIAGNOSIS — R590 Localized enlarged lymph nodes: Secondary | ICD-10-CM | POA: Insufficient documentation

## 2018-01-25 DIAGNOSIS — R918 Other nonspecific abnormal finding of lung field: Secondary | ICD-10-CM | POA: Diagnosis not present

## 2018-01-25 HISTORY — PX: ENDOBRONCHIAL ULTRASOUND: SHX5096

## 2018-01-25 HISTORY — DX: Anxiety disorder, unspecified: F41.9

## 2018-01-25 HISTORY — DX: Dyspnea, unspecified: R06.00

## 2018-01-25 SURGERY — ENDOBRONCHIAL ULTRASOUND (EBUS)
Anesthesia: General | Laterality: Right

## 2018-01-25 MED ORDER — FENTANYL CITRATE (PF) 100 MCG/2ML IJ SOLN
INTRAMUSCULAR | Status: DC | PRN
  Administered 2018-01-25: 100 ug via INTRAVENOUS

## 2018-01-25 MED ORDER — MIDAZOLAM HCL 2 MG/2ML IJ SOLN
INTRAMUSCULAR | Status: DC | PRN
  Administered 2018-01-25: 2 mg via INTRAVENOUS

## 2018-01-25 MED ORDER — LIDOCAINE HCL (CARDIAC) PF 100 MG/5ML IV SOSY
PREFILLED_SYRINGE | INTRAVENOUS | Status: DC | PRN
  Administered 2018-01-25: 60 mg via INTRAVENOUS

## 2018-01-25 MED ORDER — OXYCODONE HCL 5 MG PO TABS: 5.0000 mg | ORAL_TABLET | Freq: Once | ORAL | Status: DC | PRN

## 2018-01-25 MED ORDER — ROCURONIUM BROMIDE 50 MG/5ML IV SOLN
INTRAVENOUS | Status: AC
  Filled 2018-01-25: qty 1

## 2018-01-25 MED ORDER — PHENYLEPHRINE HCL 10 MG/ML IJ SOLN
INTRAMUSCULAR | Status: DC | PRN
  Administered 2018-01-25: 200 ug via INTRAVENOUS
  Administered 2018-01-25: 100 ug via INTRAVENOUS
  Administered 2018-01-25: 200 ug via INTRAVENOUS

## 2018-01-25 MED ORDER — DEXAMETHASONE SODIUM PHOSPHATE 10 MG/ML IJ SOLN
INTRAMUSCULAR | Status: AC
  Filled 2018-01-25: qty 1

## 2018-01-25 MED ORDER — LIDOCAINE HCL 4 % MT SOLN
OROMUCOSAL | Status: DC | PRN
  Administered 2018-01-25: 4 mL via TOPICAL

## 2018-01-25 MED ORDER — SEVOFLURANE IN SOLN
RESPIRATORY_TRACT | Status: AC
  Filled 2018-01-25: qty 250

## 2018-01-25 MED ORDER — EPHEDRINE SULFATE 50 MG/ML IJ SOLN
INTRAMUSCULAR | Status: DC | PRN
  Administered 2018-01-25 (×2): 10 mg via INTRAVENOUS

## 2018-01-25 MED ORDER — SODIUM CHLORIDE 0.9 % IV SOLN: Freq: Once | INTRAVENOUS | Status: DC

## 2018-01-25 MED ORDER — FENTANYL CITRATE (PF) 100 MCG/2ML IJ SOLN: 25.0000 ug | INTRAMUSCULAR | Status: DC | PRN

## 2018-01-25 MED ORDER — ONDANSETRON HCL 4 MG/2ML IJ SOLN
INTRAMUSCULAR | Status: AC
  Filled 2018-01-25: qty 2

## 2018-01-25 MED ORDER — EPHEDRINE SULFATE 50 MG/ML IJ SOLN
INTRAMUSCULAR | Status: AC
  Filled 2018-01-25: qty 1

## 2018-01-25 MED ORDER — OXYCODONE HCL 5 MG/5ML PO SOLN: 5.0000 mg | Freq: Once | ORAL | Status: DC | PRN

## 2018-01-25 MED ORDER — LIDOCAINE HCL (PF) 2 % IJ SOLN
INTRAMUSCULAR | Status: AC
  Filled 2018-01-25: qty 10

## 2018-01-25 MED ORDER — MIDAZOLAM HCL 2 MG/2ML IJ SOLN
INTRAMUSCULAR | Status: AC
  Filled 2018-01-25: qty 2

## 2018-01-25 MED ORDER — PROPOFOL 10 MG/ML IV BOLUS
INTRAVENOUS | Status: AC
  Filled 2018-01-25: qty 20

## 2018-01-25 MED ORDER — FENTANYL CITRATE (PF) 100 MCG/2ML IJ SOLN
INTRAMUSCULAR | Status: AC
  Filled 2018-01-25: qty 2

## 2018-01-25 MED ORDER — PROPOFOL 10 MG/ML IV BOLUS
INTRAVENOUS | Status: DC | PRN
  Administered 2018-01-25: 100 mg via INTRAVENOUS

## 2018-01-25 MED ORDER — SUGAMMADEX SODIUM 200 MG/2ML IV SOLN
INTRAVENOUS | Status: DC | PRN
  Administered 2018-01-25: 200 mg via INTRAVENOUS

## 2018-01-25 MED ORDER — ROCURONIUM BROMIDE 100 MG/10ML IV SOLN
INTRAVENOUS | Status: DC | PRN
  Administered 2018-01-25: 50 mg via INTRAVENOUS

## 2018-01-25 MED ORDER — PHENYLEPHRINE HCL 10 MG/ML IJ SOLN
INTRAMUSCULAR | Status: AC
  Filled 2018-01-25: qty 1

## 2018-01-25 MED ORDER — ONDANSETRON HCL 4 MG/2ML IJ SOLN
INTRAMUSCULAR | Status: DC | PRN
  Administered 2018-01-25: 4 mg via INTRAVENOUS

## 2018-01-25 MED ORDER — DEXAMETHASONE SODIUM PHOSPHATE 10 MG/ML IJ SOLN
INTRAMUSCULAR | Status: DC | PRN
  Administered 2018-01-25: 10 mg via INTRAVENOUS

## 2018-01-25 MED ORDER — BUTAMBEN-TETRACAINE-BENZOCAINE 2-2-14 % EX AERO
1.0000 | INHALATION_SPRAY | Freq: Once | CUTANEOUS | Status: DC
  Filled 2018-01-25: qty 20

## 2018-01-25 MED ORDER — SUGAMMADEX SODIUM 200 MG/2ML IV SOLN
INTRAVENOUS | Status: AC
  Filled 2018-01-25: qty 2

## 2018-01-25 MED ORDER — LACTATED RINGERS IV SOLN
INTRAVENOUS | Status: DC
  Administered 2018-01-25: 13:00:00 via INTRAVENOUS

## 2018-01-25 NOTE — Anesthesia Post-op Follow-up Note (Signed)
Anesthesia QCDR form completed.        

## 2018-01-25 NOTE — Anesthesia Postprocedure Evaluation (Signed)
Anesthesia Post Note  Patient: Tracey Walters  Procedure(s) Performed: ENDOBRONCHIAL ULTRASOUND (Right )  Patient location during evaluation: PACU Anesthesia Type: General Level of consciousness: awake and alert and oriented Pain management: pain level controlled Vital Signs Assessment: post-procedure vital signs reviewed and stable Respiratory status: spontaneous breathing, nonlabored ventilation and respiratory function stable Cardiovascular status: blood pressure returned to baseline and stable Postop Assessment: no signs of nausea or vomiting Anesthetic complications: no     Last Vitals:  Vitals:   01/25/18 1424 01/25/18 1439  BP: 128/73 125/74  Pulse: (!) 110 (!) 104  Resp: 20 20  Temp:  36.4 C  SpO2: 92% 93%    Last Pain:  Vitals:   01/25/18 1439  TempSrc:   PainSc: 0-No pain                 Masud Holub

## 2018-01-25 NOTE — Anesthesia Preprocedure Evaluation (Signed)
Anesthesia Evaluation  Patient identified by MRN, date of birth, ID band Patient awake    Reviewed: Allergy & Precautions, H&P , NPO status , Patient's Chart, lab work & pertinent test results  History of Anesthesia Complications Negative for: history of anesthetic complications  Airway Mallampati: III  TM Distance: >3 FB Neck ROM: limited    Dental  (+) Chipped, Poor Dentition, Missing   Pulmonary neg pulmonary ROS, neg shortness of breath, Current Smoker,           Cardiovascular Exercise Tolerance: Good hypertension, (-) angina(-) Past MI and (-) DOE      Neuro/Psych PSYCHIATRIC DISORDERS  Neuromuscular disease    GI/Hepatic Neg liver ROS, GERD  Medicated and Controlled,  Endo/Other  negative endocrine ROS  Renal/GU      Musculoskeletal  (+) Arthritis ,   Abdominal   Peds  Hematology negative hematology ROS (+)   Anesthesia Other Findings Past Medical History: No date: Anxiety No date: Carpal tunnel syndrome     Comment:  right wrist No date: Dyspnea No date: GERD (gastroesophageal reflux disease) No date: Hyperlipidemia 20 yrs: Hypertension No date: Raynaud's phenomenon No date: Rheumatoid arteritis ([place]) No date: Ulcer  Past Surgical History: 1992: ABDOMINAL HYSTERECTOMY 2011: BREAST BIOPSY; Right     Comment:  neg 11/1996: BREAST BIOPSY; Right     Comment:  Dense fibrosis,  benign epithelial hyperplasia. 2012: CARPAL TUNNEL RELEASE; Right 2010: COLONOSCOPY No date: TONSILLECTOMY No date: TUBAL LIGATION No date: UPPER GI ENDOSCOPY  BMI    Body Mass Index:  19.49 kg/m      Reproductive/Obstetrics negative OB ROS                             Anesthesia Physical Anesthesia Plan  ASA: III  Anesthesia Plan: General ETT   Post-op Pain Management:    Induction: Intravenous  PONV Risk Score and Plan: Ondansetron, Dexamethasone, Midazolam and Treatment may vary due  to age or medical condition  Airway Management Planned: Oral ETT  Additional Equipment:   Intra-op Plan:   Post-operative Plan: Extubation in OR  Informed Consent: I have reviewed the patients History and Physical, chart, labs and discussed the procedure including the risks, benefits and alternatives for the proposed anesthesia with the patient or authorized representative who has indicated his/her understanding and acceptance.     Dental Advisory Given  Plan Discussed with: Anesthesiologist, CRNA and Surgeon  Anesthesia Plan Comments: (Patient consented for risks of anesthesia including but not limited to:  - adverse reactions to medications - damage to teeth, lips or other oral mucosa - sore throat or hoarseness - Damage to heart, brain, lungs or loss of life  Patient voiced understanding.)        Anesthesia Quick Evaluation

## 2018-01-25 NOTE — Transfer of Care (Signed)
Immediate Anesthesia Transfer of Care Note  Patient: Tracey Walters  Procedure(s) Performed: ENDOBRONCHIAL ULTRASOUND (Right )  Patient Location: PACU  Anesthesia Type:General  Level of Consciousness: awake, alert , oriented and patient cooperative  Airway & Oxygen Therapy: Patient Spontanous Breathing and Patient connected to face mask oxygen  Post-op Assessment: Report given to RN and Post -op Vital signs reviewed and stable  Post vital signs: Reviewed and stable  Last Vitals:  Vitals Value Taken Time  BP 142/79 01/25/2018  2:09 PM  Temp    Pulse 112 01/25/2018  2:11 PM  Resp 19 01/25/2018  2:11 PM  SpO2 96 % 01/25/2018  2:11 PM  Vitals shown include unvalidated device data.  Last Pain:  Vitals:   01/25/18 1211  TempSrc: Tympanic  PainSc: 0-No pain         Complications: No apparent anesthesia complications

## 2018-01-25 NOTE — Interval H&P Note (Signed)
History and Physical Interval Note:  01/25/2018 12:26 PM  Tracey Walters  has presented today for surgery, with the diagnosis of RIGHT UPPER LOBE NODULE  The various methods of treatment have been discussed with the patient and family. After consideration of risks, benefits and other options for treatment, the patient has consented to  Procedure(s): ENDOBRONCHIAL ULTRASOUND (Right) as a surgical intervention .  The patient's history has been reviewed, patient examined, no change in status, stable for surgery.  I have reviewed the patient's chart and labs.  Questions were answered to the patient's satisfaction.     Vernard Gambles

## 2018-01-25 NOTE — Anesthesia Procedure Notes (Signed)
Procedure Name: Intubation Date/Time: 01/25/2018 1:09 PM Performed by: Eben Burow, CRNA Pre-anesthesia Checklist: Patient identified, Emergency Drugs available, Suction available and Patient being monitored Patient Re-evaluated:Patient Re-evaluated prior to induction Oxygen Delivery Method: Circle system utilized Preoxygenation: Pre-oxygenation with 100% oxygen Induction Type: IV induction Ventilation: Mask ventilation without difficulty Laryngoscope Size: Miller and 2 Grade View: Grade I Tube type: Oral Tube size: 8.5 mm Number of attempts: 1 Airway Equipment and Method: Stylet and LTA kit utilized Placement Confirmation: ETT inserted through vocal cords under direct vision,  breath sounds checked- equal and bilateral and positive ETCO2 Secured at: 21 cm Tube secured with: Tape Dental Injury: Teeth and Oropharynx as per pre-operative assessment

## 2018-01-25 NOTE — Op Note (Addendum)
PROCEDURE:  1)BRONCHOSCOPY 2)ENDOBRONCHIAL ULTRASOUND   PROCEDURE DATE: 01/25/2018  TIME:  NAME:  Keimani Laufer  DOB:[birth year]  MRN: [id number] LOC:  ARPO/None    HOSP DAY: Outpatient Same Day Surgery CODE STATUS:  Full      Indications/Preliminary Diagnosis: FDG avid Hilar Adenopathy  Consent: (Place X beside choice/s below)  The benefits, risks and possible complications of the procedure were        explained to:  _X__ patient  ___ patient's family  ___ other:___________  who verbalized understanding and gave:  ___ verbal  ___ written  _X_ verbal and written  ___ telephone  ___ other:________ consent.      Unable to obtain consent; procedure performed on emergent basis.     Other:       PRESEDATION ASSESSMENT: History and Physical has been performed. Patient meds and allergies have been reviewed. Presedation airway examination has been performed and documented. Baseline vital signs, sedation score, oxygenation status, and cardiac rhythm were reviewed. Patient was deemed to be in satisfactory condition to undergo the procedure.    PREMEDICATIONS: SEE ANESTHESIOLOGY RECORDS   Insertion Route (Place X beside choice below)   Nasal   Oral  X Endotracheal Tube   Tracheostomy   INTRAPROCEDURE MEDICATIONS: SEE ANESTHESIOLOGY RECORDS   PROCEDURE DETAILS: Timeout performed and correct patient, name, & ID confirmed.  The patient was inducted under general anesthesia by the nurse anesthetist with direct supervision of the anesthesiologist.  See seizure records.  The patient was intubated with a #8.5 ET tube without difficulty.  A Portex adapter was placed on the ET tube.  Once the patient was under satisfactory general anesthesia the Olympus video therapeutic bronchoscope was advanced into the ET tube with a Portex adapter.  The visible portions of the trachea were normal.  Carina was sharp.  The bronchoscope was advanced to the right tracheobronchial tree and it showed no  endobronchial lesions.  The mucosa showed thin secretions throughout which coated it.  The bronchoscope was then pulled and the left tracheobronchial tree was examined again no endobronchial lesions were noted but the similar findings mucosal coating of the bronchi was noted.  At this point the bronchoscope was pulled out and switched to an Olympus endobronchial ultrasound scope.  The pretracheal, subcarinal, hilar and visible areas of the mediastinum were not examined.  Only target that could be found was a lymph node in station 10 R corresponding to the findings on the PET and CT scan of the chest performed previously.  This lymph node was small at the largest diameter point it was 1.5 cm then it would narrow to about 0.5 cm.  Utilizing a Cook 22-gauge pro core EBUS needle 3 passes were made using this needle.  Cytotechnologist confirmed lymph node sampling.  At this point the needle was switched to an Snead needle and an additional 7 passes were made with this needle.  Material was placed into CytoLyt media, 2 of the passes were placed in RPMI media for flow cytometry should it be needed.  At this point having completed this portion of the procedure the EBUS scope was retrieved and the airway was reinspected with the therapeutic bronchoscope.  Hemostasis was excellent.  Patient received lidocaine 1% total of 9 mL's as bronchial lavage prior to removing the scope.  The seizure was then terminated.  The patient was then allowed to emerge from general anesthesia and she was taken to the PACU in satisfactory condition.  Patient  tolerated the procedure well.   SPECIMENS (Sites): (Place X beside choice below)  Specimens Description   No Specimens Obtained     Washings    Lavage    Biopsies   X Fine Needle Aspirates X 10   Brushings    Sputum    FINDINGS:  1.  No endobronchial lesions 2.  Right hilar lymph node station 10 R visible with endobronchial ultrasound  ESTIMATED BLOOD LOSS:  nil COMPLICATIONS/RESOLUTION: none, patient tolerated the procedure well.    IMPRESSION:POST-PROCEDURE DX: 1.  Right hilar adenopathy    RECOMMENDATION/PLAN:  Follow up Pathology Reports Follow-up with [facility] in 2 weeks time    C. Derrill Kay, M.D.  [facility]  Advanced Bronchoscopy

## 2018-01-25 NOTE — Discharge Instructions (Signed)

## 2018-01-26 ENCOUNTER — Encounter: Payer: Self-pay | Admitting: Pulmonary Disease

## 2018-01-26 LAB — CYTOLOGY - NON PAP

## 2018-02-09 ENCOUNTER — Ambulatory Visit: Payer: 59 | Admitting: Pulmonary Disease

## 2018-02-09 ENCOUNTER — Encounter: Payer: Self-pay | Admitting: Pulmonary Disease

## 2018-02-09 VITALS — BP 122/72 | HR 73 | Ht 63.0 in | Wt 116.8 lb

## 2018-02-09 DIAGNOSIS — F17210 Nicotine dependence, cigarettes, uncomplicated: Secondary | ICD-10-CM

## 2018-02-09 DIAGNOSIS — R911 Solitary pulmonary nodule: Secondary | ICD-10-CM

## 2018-02-09 MED ORDER — VARENICLINE TARTRATE 1 MG PO TABS: 1.0000 mg | ORAL_TABLET | Freq: Two times a day (BID) | ORAL | 2 refills | Status: DC

## 2018-02-09 MED ORDER — VARENICLINE TARTRATE 0.5 MG X 11 & 1 MG X 42 PO MISC: ORAL | 0 refills | Status: DC

## 2018-02-09 NOTE — Patient Instructions (Signed)
1.  We will start you on Chantix to help with smoking cessation.  2.  We will see you in follow-up with the next chest CT and determine at that time if further biopsies are necessary.

## 2018-02-09 NOTE — Progress Notes (Signed)
Subjective:    Patient ID: Tracey Walters, female    DOB: 01/07/1961, 58 y.o.   MRN: [id number]  HPI Patient is a 58 year old current smoker (2 PPD) who presents for follow-up of abnormal lung cancer screening CT. Her initial low-dose cancer screening was performed in June 2018 and at that time no significant abnormalities were noted and a yearly screening was recommended.  She then had a follow-up in June 2019 that showed a 5.2 mm right upper lobe nodule and short interval follow-up was recommended.  This was performed in December 2019 and by this time the nodule had increased to 8.6 mm.  This was followed by PET/CT which showed some minimal uptake on the nodule however the patient did have right hilar adenopathy which was difficult to characterize but was FDG avid.  Subsequently she had a CT scan with contrast that showed that this right hilar node is approximately 2.3 cm in size.  Unfortunately because of the number of imaging studies that have been performed we will not be able to perform navigational bronchoscopy as it would need a dedicated CT for the same.  The DICOM data from the last CT performed on 20 December has already been purged.  DICOM data is what is necessary to make a map for navigational bronchoscopy.  However the right hilar mediastinal node was amenable to biopsy by endobronchial ultrasound.  She underwent endobronchial ultrasound on 25 January 2018.  This showed that the lymph node in question was actually 1.5 to 1.6 cm in size and the node appeared normal.  Examination of the mediastinum did not show any other lymph nodes that were amenable for TB NA.  The lymph node was sampled and the sampling showed cells compatible with lymph node biopsy however no malignancy was noted.  It is likely that this was a reactive node.  She follows up today and has had no difficulties after her procedure.  She is now pre-contemplative about quitting smoking and would like to embark in a smoking  cessation program.  She has not had good luck with patches but would like to try Chantix again which was effective for her at one point.  She does not endorse fevers, chills or sweats.  No cough or sputum production.  No other complaints are voiced.  Overall she feels well.   Review of Systems  Constitutional: Negative.   HENT: Negative.   Eyes: Negative.   Respiratory: Negative.   Cardiovascular: Negative.   Gastrointestinal: Negative.   Skin: Negative.   Neurological: Negative.   All other systems reviewed and are negative.      Objective:   Physical Exam Vitals signs and nursing note reviewed.  Constitutional:      General: She is not in acute distress.    Appearance: Normal appearance. She is normal weight.  HENT:     Head: Normocephalic and atraumatic.     Right Ear: External ear normal.     Left Ear: External ear normal.     Nose: Nose normal.     Mouth/Throat:     Mouth: Mucous membranes are moist.     Pharynx: Oropharynx is clear.  Eyes:     General: No scleral icterus.    Extraocular Movements: Extraocular movements intact.     Conjunctiva/sclera: Conjunctivae normal.     Pupils: Pupils are equal, round, and reactive to light.  Neck:     Musculoskeletal: Neck supple.     Thyroid: No thyromegaly.  Trachea: Trachea and phonation normal.  Cardiovascular:     Rate and Rhythm: Normal rate and regular rhythm.     Pulses: Normal pulses.     Heart sounds: Normal heart sounds.  Pulmonary:     Effort: Pulmonary effort is normal.     Breath sounds: Normal breath sounds.  Abdominal:     General: Abdomen is flat. There is no distension.  Musculoskeletal: Normal range of motion.     Right lower leg: No edema.     Left lower leg: No edema.  Skin:    General: Skin is warm and dry.  Neurological:     General: No focal deficit present.     Mental Status: She is alert and oriented to person, place, and time.  Psychiatric:        Mood and Affect: Mood normal.         Behavior: Behavior normal.           Assessment & Plan:   1.  Right upper lobe nodule: This will need ongoing follow-up.  She may need navigational bronchoscopy.  We will see her in follow-up in 2 months time with CT chest with super dimension protocol at that time.  She is to contact us prior to that time should any new difficulties arise.  2.  Tobacco dependence due to cigarettes: She was counseled with regards to discontinuation of smoking.  She elected to try Chantix again for smoking cessation.  Prescriptions were sent to her pharmacy of choice.

## 2018-02-10 ENCOUNTER — Encounter: Payer: Self-pay | Admitting: Pulmonary Disease

## 2018-03-08 ENCOUNTER — Telehealth: Payer: Self-pay | Admitting: Pulmonary Disease

## 2018-03-08 NOTE — Telephone Encounter (Signed)
Returned patient's call and scheduled CT Chest for 04/13/2018 at 10:00 at [facility]. Pt is aware of appointment date, time location and instructions. F/U appointment also made at the time of this call to return on 04/14/2018 to see Dr. Patsey Berthold. Nothing else needed at this time. Rhonda J Cobb

## 2018-03-18 DIAGNOSIS — E559 Vitamin D deficiency, unspecified: Secondary | ICD-10-CM | POA: Diagnosis not present

## 2018-03-18 DIAGNOSIS — E785 Hyperlipidemia, unspecified: Secondary | ICD-10-CM | POA: Diagnosis not present

## 2018-03-18 DIAGNOSIS — F17200 Nicotine dependence, unspecified, uncomplicated: Secondary | ICD-10-CM | POA: Diagnosis not present

## 2018-03-18 DIAGNOSIS — J309 Allergic rhinitis, unspecified: Secondary | ICD-10-CM | POA: Diagnosis not present

## 2018-04-07 ENCOUNTER — Telehealth: Payer: Self-pay | Admitting: Pulmonary Disease

## 2018-04-07 NOTE — Telephone Encounter (Signed)
Pt is scheduled for OV on 04/14/18 to discuss possible bx with CT prior.  After discussion with LG, we are unable to perform a bx at this time, due to covid-19. Pt has been rescheduled for 05/10/18 and will need CT prior to this appt. Pt is aware of this information and voiced her understanding.   Suanne Marker can you reschedule CT. Thanks.

## 2018-04-07 NOTE — Telephone Encounter (Signed)
CT Chest has been R/S to Friday 05/06/2018 at 4:30 at [facility]. Pt to arrive at 4:15. No prep.  Email sent to patient per her request of appointment information  Tracey Walters Nothing else needed at this time . Tracey Walters

## 2018-04-13 ENCOUNTER — Ambulatory Visit: Payer: 59

## 2018-04-14 ENCOUNTER — Ambulatory Visit: Payer: Self-pay | Admitting: Pulmonary Disease

## 2018-05-05 ENCOUNTER — Other Ambulatory Visit: Payer: Self-pay

## 2018-05-05 ENCOUNTER — Ambulatory Visit
Admission: RE | Admit: 2018-05-05 | Discharge: 2018-05-05 | Disposition: A | Discharge status: Home / Self Care | Payer: 59 | Source: Ambulatory Visit | Attending: Pulmonary Disease | Admitting: Pulmonary Disease

## 2018-05-05 DIAGNOSIS — R911 Solitary pulmonary nodule: Secondary | ICD-10-CM | POA: Insufficient documentation

## 2018-05-06 ENCOUNTER — Ambulatory Visit: Admission: RE | Admit: 2018-05-06 | Payer: 59 | Source: Ambulatory Visit

## 2018-05-10 ENCOUNTER — Ambulatory Visit (INDEPENDENT_AMBULATORY_CARE_PROVIDER_SITE_OTHER): Payer: 59 | Admitting: Pulmonary Disease

## 2018-05-10 ENCOUNTER — Encounter: Payer: Self-pay | Admitting: Pulmonary Disease

## 2018-05-10 DIAGNOSIS — R911 Solitary pulmonary nodule: Secondary | ICD-10-CM

## 2018-05-10 DIAGNOSIS — F17210 Nicotine dependence, cigarettes, uncomplicated: Secondary | ICD-10-CM

## 2018-05-10 DIAGNOSIS — R590 Localized enlarged lymph nodes: Secondary | ICD-10-CM

## 2018-05-10 NOTE — Progress Notes (Addendum)
Subjective:    Patient ID: Tracey Walters, female    DOB: 11/28/1960, 58 y.o.   MRN: [id number]  HPI This is a telephone visit due to COVID-19.  Patient consented to the visit.  She understands that physical exam will not be available.  Two identifiers were utilized to identify the correct patient.  Purpose of the visit is to follow-up on diagnostic studies.  Patient is a 58 year old current smoker (2 PPD) who presents for follow-up of abnormal lung cancer screening CT. Her initial low-dose cancer screening was performed in June 2018 and at that time no significant abnormalities were noted and a yearly screening was recommended.  She then had a follow-up in June 2019 that showed a 5.2 mm right upper lobe nodule and short interval follow-up was recommended.  This was performed in December 2019 and by this time the nodule had increased to 8.6 mm.  This was followed by PET/CT which showed some minimal uptake on the nodule however the patient did have right hilar adenopathy which was difficult to characterize but was FDG avid.  Subsequently she had a CT scan with contrast that showed that this right hilar node is approximately 2.3 cm in size.  She underwent endobronchial ultrasound on 25 January 2018 with biopsy of the right hilar node.  This showed only benign lymphoid tissue.  It is of note that the hilar node at the time of endobronchial ultrasound had decreased in size.  She follows a via telemedicine today as she had a follow-up CT scan on 23 April.  This shows that her mediastinal adenopathy has totally resolved.  This likely was a reactive node.  The right upper lobe nodule remains stable and without growth.  Patient does not describe any fevers, chills or sweats.  No sore throat, no anosmia.  No cough or sputum production over her usual.  No dyspnea.  Unfortunately she has relapsed in smoking again.  She had a brief period of smoking cessation with Chantix.  She states that she has been under  significant stress due to cut back on her hours and difficulties paying bills.   Overall, though stressed, she feels well.   Review of Systems  Constitutional: Negative.   HENT: Negative.   Eyes: Negative.   Respiratory: Negative.   Cardiovascular: Negative.   Gastrointestinal: Negative.   Skin: Negative.   Neurological: Negative.   All other systems reviewed and are negative.      Objective:    This is a tele-visit, physical exam was not possible.  We discussed the findings from the most recent CT scan of the chest from 23 April.  She was offered opportunities to ask any questions.  She did not have any concerns stated.    Assessment & Plan:   1.  Right upper lobe nodule: This will need ongoing follow-up.  She may need navigational bronchoscopy. The lesion has not grown in the last 4 months.  It has shown growth in a 36-month interval.  The patient would like to defer further work-up for another few months due to the COVID 19 pandemic.  We will see her in follow-up in 3 months time with CT chest with super dimension protocol at that time.  She is to contact us prior to that time should any new difficulties arise.  2.  Right hilar adenopathy: This has completely resolved.  Had undergone endobronchial ultrasound with biopsy which yielded only benign lymphoid tissue.  Most recent CT scan does not show this  finding.    2.  Tobacco dependence due to cigarettes: She was counseled with regards to discontinuation of smoking.  She was given Chantix on her last visit however she has been under significant amount of stress due to having hours cut back during COVID-19.  He discussed other relaxation methods including meditation etc. and recommended that she restart Chantix to discontinue smoking.   Total tele-visit time: 15 minutes.  This chart was dictated using voice recognition software/Dragon.  Despite best efforts to proofread, errors can occur which can change the meaning.  Any change  was purely unintentional.

## 2018-05-10 NOTE — Patient Instructions (Signed)
Follow-up in 3 months with super dimension CT.

## 2018-06-07 DIAGNOSIS — E785 Hyperlipidemia, unspecified: Secondary | ICD-10-CM | POA: Diagnosis not present

## 2018-06-07 DIAGNOSIS — F17200 Nicotine dependence, unspecified, uncomplicated: Secondary | ICD-10-CM | POA: Diagnosis not present

## 2018-06-07 DIAGNOSIS — I7300 Raynaud's syndrome without gangrene: Secondary | ICD-10-CM | POA: Diagnosis not present

## 2018-06-07 DIAGNOSIS — E559 Vitamin D deficiency, unspecified: Secondary | ICD-10-CM | POA: Diagnosis not present

## 2018-06-07 DIAGNOSIS — Z0000 Encounter for general adult medical examination without abnormal findings: Secondary | ICD-10-CM | POA: Diagnosis not present

## 2018-08-03 ENCOUNTER — Encounter: Payer: Self-pay | Admitting: General Surgery

## 2018-08-10 ENCOUNTER — Ambulatory Visit: Payer: 59 | Admitting: Pulmonary Disease

## 2018-08-12 ENCOUNTER — Ambulatory Visit
Admission: RE | Admit: 2018-08-12 | Discharge: 2018-08-12 | Disposition: A | Discharge status: Home / Self Care | Payer: 59 | Source: Ambulatory Visit | Attending: Pulmonary Disease | Admitting: Pulmonary Disease

## 2018-08-12 ENCOUNTER — Other Ambulatory Visit: Payer: Self-pay

## 2018-08-12 DIAGNOSIS — Z01818 Encounter for other preprocedural examination: Secondary | ICD-10-CM | POA: Diagnosis not present

## 2018-08-12 DIAGNOSIS — R918 Other nonspecific abnormal finding of lung field: Secondary | ICD-10-CM | POA: Diagnosis not present

## 2018-08-12 DIAGNOSIS — J439 Emphysema, unspecified: Secondary | ICD-10-CM | POA: Diagnosis not present

## 2018-08-12 DIAGNOSIS — R911 Solitary pulmonary nodule: Secondary | ICD-10-CM | POA: Insufficient documentation

## 2018-08-15 ENCOUNTER — Ambulatory Visit: Payer: 59 | Admitting: Pulmonary Disease

## 2018-08-15 ENCOUNTER — Telehealth: Payer: Self-pay | Admitting: Pulmonary Disease

## 2018-08-15 NOTE — Telephone Encounter (Signed)
Pt notified that Dr.Gonzalez typically discusses those results at her appointment so unfortunately I am unable to give her results today. Pt verbalized understanding and is ok with waiting.

## 2018-08-18 ENCOUNTER — Telehealth: Payer: Self-pay | Admitting: Pulmonary Disease

## 2018-08-18 ENCOUNTER — Ambulatory Visit (INDEPENDENT_AMBULATORY_CARE_PROVIDER_SITE_OTHER): Payer: 59 | Admitting: Pulmonary Disease

## 2018-08-18 DIAGNOSIS — Z716 Tobacco abuse counseling: Secondary | ICD-10-CM | POA: Diagnosis not present

## 2018-08-18 DIAGNOSIS — R918 Other nonspecific abnormal finding of lung field: Secondary | ICD-10-CM

## 2018-08-18 DIAGNOSIS — J438 Other emphysema: Secondary | ICD-10-CM

## 2018-08-18 DIAGNOSIS — R590 Localized enlarged lymph nodes: Secondary | ICD-10-CM | POA: Diagnosis not present

## 2018-08-18 DIAGNOSIS — F17210 Nicotine dependence, cigarettes, uncomplicated: Secondary | ICD-10-CM

## 2018-08-18 DIAGNOSIS — R911 Solitary pulmonary nodule: Secondary | ICD-10-CM

## 2018-08-18 MED ORDER — AZITHROMYCIN 250 MG PO TABS: ORAL_TABLET | ORAL | 0 refills | Status: AC

## 2018-08-18 NOTE — Progress Notes (Signed)
Subjective:    Patient ID: Tracey Walters, female    DOB: Sep 06, 1960, 58 y.o.   MRN: [id number] Virtual Visit Via Video or Telephone Note:   This visit type was conducted due to national recommendations for restrictions regarding the COVID-19 pandemic .  This format is felt to be most appropriate for this patient at this time.  All issues noted in this document were discussed and addressed.  No physical exam was performed (except for noted visual exam findings with Video Visits).    I connected with Sinclair Grooms by telephone at 11:35 hrs and verified that I was speaking with the correct person using two identifiers. Location patient: home Location provider: [facility] Persons participating in the virtual visit: patient, physician   I discussed the limitations, risks, security and privacy concerns of performing an evaluation and management service by telephone and the availability of in person appointments. The patient expressed understanding and agreed to proceed. HPI Is a 58 year old current smoker (2 PPD) who follows remotely today for the issue of discussion of potential diagnostic interventions with regards to multiple lung nodules.  Recall that the patient had an initial low-dose cancer screening performed in June 2018 at that time no significant abnormalities were noted and yearly screening was recommended.She then had a follow-up in June 2019 that showed a 5.2 mm right upper lobe nodule and short interval follow-up was recommended. This was performed in December 2019 and by this time the nodule had increased to 8.6 mm. This was followed by PET/CT which showed some minimal uptake on the nodule however the patient did have right hilar adenopathy which was difficult to characterize but was FDG avid. Subsequently she had a CT scan with contrast that showed that this right hilar node is approximately 2.3 cm in size.  She underwent endobronchial ultrasound on 25 January 2018  with biopsy of the right hilar node.  This showed only benign lymphoid tissue.  It is of note that the hilar node at the time of endobronchial ultrasound had decreased in size.  She did not undergo navigational bronchoscopy due to not having a satisfactory mapping CT at that time.  The patient's has since been noted to have evanescent nodules that appear and disappear.  She had a CT chest on 31 July which shows that the right upper lobe nodule remains stable and adjacent to the fissure.  She had 2 new left upper lobe nodules.  She has significant emphysema.  The patient has been reluctant to undergo repeat bronchoscopy due to stressors in her life.  She would like to continue observational protocol.  Of note the right upper lobe nodule has very smooth round borders.  Prior PET/CT has shown this to be very weakly FDG avid.  Patient continues to smoke anywhere between 1-1/2 to 2 packs of cigarettes per day.  She has not had any symptoms, no dyspnea, no chest pain, no cough or sputum production no hemoptysis.   Review of Systems A 10 point review of systems was performed and it is as noted above otherwise negative.    Objective:   Physical Exam  No physical exam performed today as this is a via telephone visit.  The patient has no signs of conversational dyspnea.  Representative image from the CT scan of the chest performed 12 August 2018 as below:      Assessment & Plan:   Multiple lung nodules on CT Predominant nodule is on the right upper lobe 9 x 9 mm  Evanescent pulmonary nodules elsewhere Reimage in 3 months, patient still prefers observational protocol  Hilar adenopathy This was intensely FDG avid Negative bronchoscopy with EBUS Has resolved radiographically Query relationship to right upper lobe nodule as appeared at same time  Pulmonary emphysema Noted on CT scan of the chest PFTs more consistent with restriction on 12/2017 Moderate diffusion capacity impairment Patient  asymptomatic in this regard  Tobacco dependence due to cigarettes Precontemplative with regards to discontinuation Counseled, total time 3 to 5 minutes States stressors keep her from quitting   Total visit time: 14 minutes  C. Derrill Kay, MD [facility]  *This note was dictated using voice recognition software/Dragon.  Despite best efforts to proofread, errors can occur which can change the meaning.  Any change was purely unintentional.

## 2018-08-18 NOTE — Telephone Encounter (Signed)
Per Dr. Patsey Berthold verbally- send in zpak.  Rx for zpak has been sent to preferred pharmacy. Pt is aware and voiced her understanding.

## 2018-09-12 ENCOUNTER — Ambulatory Visit: Payer: 59 | Admitting: Podiatry

## 2018-09-12 ENCOUNTER — Other Ambulatory Visit: Payer: Self-pay

## 2018-09-12 ENCOUNTER — Ambulatory Visit (INDEPENDENT_AMBULATORY_CARE_PROVIDER_SITE_OTHER): Payer: 59

## 2018-09-12 ENCOUNTER — Encounter: Payer: Self-pay | Admitting: Podiatry

## 2018-09-12 ENCOUNTER — Other Ambulatory Visit: Payer: Self-pay | Admitting: Podiatry

## 2018-09-12 VITALS — BP 129/86 | HR 79 | Resp 16

## 2018-09-12 DIAGNOSIS — M869 Osteomyelitis, unspecified: Secondary | ICD-10-CM

## 2018-09-12 DIAGNOSIS — M778 Other enthesopathies, not elsewhere classified: Secondary | ICD-10-CM

## 2018-09-12 MED ORDER — LEVOFLOXACIN 750 MG PO TABS: 750.0000 mg | ORAL_TABLET | Freq: Every day | ORAL | 1 refills | Status: DC

## 2018-09-12 MED ORDER — CLINDAMYCIN HCL 150 MG PO CAPS: 150.0000 mg | ORAL_CAPSULE | Freq: Three times a day (TID) | ORAL | 1 refills | Status: DC

## 2018-09-12 NOTE — Progress Notes (Signed)
Subjective:  Patient ID: Tracey Walters, female    DOB: 19-Apr-1960,  MRN: [id number] HPI Chief Complaint  Patient presents with  . Toe Pain    Hallux left - red, swollen and sore around toenail area x 3 weeks, no injury, Raynaud's  . New Patient (Initial Visit)    58 y.o. female presents with the above complaint.   ROS: She denies fever chills nausea vomiting muscle aches pains calf pain back pain chest pain shortness of breath.  She does have a history of pulmonary nodules being watched by pulmonology at this point having CT scans every so often and PET scans.  Past Medical History:  Diagnosis Date  . Anxiety   . Carpal tunnel syndrome    right wrist  . Dyspnea   . GERD (gastroesophageal reflux disease)   . Hyperlipidemia   . Hypertension 20 yrs  . Raynaud's phenomenon   . 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 Ulcer    Past Surgical History:  Procedure Laterality Date  . ABDOMINAL HYSTERECTOMY  1992  . BREAST BIOPSY Right 2011   neg  . BREAST BIOPSY Right 11/1996   Dense fibrosis,  benign epithelial hyperplasia.  Marland Kitchen CARPAL TUNNEL RELEASE Right 2012  . COLONOSCOPY  2010  . ENDOBRONCHIAL ULTRASOUND Right 01/25/2018   Procedure: ENDOBRONCHIAL ULTRASOUND;  Surgeon: Tyler Pita, MD;  Location: [facility] ORS;  Service: Cardiopulmonary;  Laterality: Right;  . TONSILLECTOMY    . TUBAL LIGATION    . UPPER GI ENDOSCOPY      Current Outpatient Medications:  .  ALPRAZolam (XANAX) 0.25 MG tablet, Take 0.25 mg by mouth at bedtime as needed for anxiety or sleep., Disp: , Rfl:  .  amLODipine-benazepril (LOTREL) 5-10 MG capsule, Take 1 capsule by mouth daily. , Disp: , Rfl:  .  aspirin 81 MG tablet, Take 81 mg by mouth daily., Disp: , Rfl:  .  Calcium-Vitamin D-Vitamin K (CHEWABLE CALCIUM PO), Take 1 each by mouth daily. Viactiv, Disp: , Rfl:  .  Cholecalciferol (VITAMIN D-3) 125 MCG (5000 UT) TABS, Take 5,000 Units by mouth daily. , Disp: , Rfl:  .  clindamycin (CLEOCIN) 150 MG  capsule, Take 1 capsule (150 mg total) by mouth 3 (three) times daily., Disp: 30 capsule, Rfl: 1 .  Cyanocobalamin (VITAMIN B 12 PO), Take 1,000 mg by mouth daily., Disp: , Rfl:  .  gabapentin (NEURONTIN) 300 MG capsule, Take 300 mg by mouth daily as needed (pain). , Disp: , Rfl: 3 .  levofloxacin (LEVAQUIN) 750 MG tablet, Take 1 tablet (750 mg total) by mouth daily., Disp: 30 tablet, Rfl: 1 .  pantoprazole (PROTONIX) 40 MG tablet, Take 40 mg by mouth daily., Disp: , Rfl:  .  traMADol (ULTRAM) 50 MG tablet, Take 50 mg by mouth 2 (two) times daily., Disp: , Rfl:  .  varenicline (CHANTIX CONTINUING MONTH PAK) 1 MG tablet, Take 1 tablet (1 mg total) by mouth 2 (two) times daily., Disp: 50 tablet, Rfl: 2 .  varenicline (CHANTIX STARTING MONTH PAK) 0.5 MG X 11 & 1 MG X 42 tablet, As directed in package, Disp: 53 tablet, Rfl: 0  Allergies  Allergen Reactions  . Penicillins Hives    DID THE REACTION INVOLVE: Swelling of the face/tongue/throat, SOB, or low BP? No Sudden or severe rash/hives, skin peeling, or the inside of the mouth or nose? No Did it require medical treatment? No When did it last happen?unkn If all above answers are "NO", may proceed with cephalosporin  use.    Review of Systems Objective:   Vitals:   09/12/18 1544  BP: 129/86  Pulse: 79  Resp: 16    General: Well developed, nourished, in no acute distress, alert and oriented x3   Dermatological: Skin is warm, dry and supple bilateral. Nails x 10 are well maintained; remaining integument appears unremarkable at this time. There are no open sores, no preulcerative lesions, no rash or signs of infection present.  Vascular: Dorsalis Pedis artery and Posterior Tibial artery pedal pulses are 2/4 bilateral with immedate capillary fill time. Pedal hair growth present. No varicosities and no lower extremity edema present bilateral.   Neruologic: Grossly intact via light touch bilateral. Vibratory intact via tuning fork  bilateral. Protective threshold with Semmes Wienstein monofilament intact to all pedal sites bilateral. Patellar and Achilles deep tendon reflexes 2+ bilateral. No Babinski or clonus noted bilateral.   Musculoskeletal: No gross boney pedal deformities bilateral. No pain, crepitus, or limitation noted with foot and ankle range of motion bilateral. Muscular strength 5/5 in all groups tested bilateral.  Painful red swollen hallux left no purulence no malodor no openings no lesions. Gait: Unassisted, Nonantalgic.    Radiographs:  Radiographs taken today demonstrate complete destruction of the distal aspect of the tuft of the distal phalanx hallux left.  It appears that this is osteomyelitis.  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  Hallux left.  Plan: Discussed etiology pathology can conservative versus surgical therapies.  Requesting an MRI to rule out osteomyelitis and possible metastasis.  Started her on Levaquin 751 p.o. daily and clindamycin 150 3 times daily.  Follow-up with her once her MRI has returned.     Tracey Walters, [place]

## 2018-09-15 DIAGNOSIS — M8580 Other specified disorders of bone density and structure, unspecified site: Secondary | ICD-10-CM | POA: Diagnosis not present

## 2018-09-15 DIAGNOSIS — I7300 Raynaud's syndrome without gangrene: Secondary | ICD-10-CM | POA: Diagnosis not present

## 2018-09-15 DIAGNOSIS — E785 Hyperlipidemia, unspecified: Secondary | ICD-10-CM | POA: Diagnosis not present

## 2018-09-15 DIAGNOSIS — F17200 Nicotine dependence, unspecified, uncomplicated: Secondary | ICD-10-CM | POA: Diagnosis not present

## 2018-09-27 ENCOUNTER — Other Ambulatory Visit: Payer: Self-pay

## 2018-09-27 DIAGNOSIS — Z1231 Encounter for screening mammogram for malignant neoplasm of breast: Secondary | ICD-10-CM

## 2018-10-03 ENCOUNTER — Telehealth: Payer: Self-pay

## 2018-10-03 ENCOUNTER — Other Ambulatory Visit
Admission: RE | Admit: 2018-10-03 | Discharge: 2018-10-03 | Disposition: A | Discharge status: Home / Self Care | Payer: 59 | Source: Ambulatory Visit | Attending: Podiatry | Admitting: Podiatry

## 2018-10-03 DIAGNOSIS — Z01812 Encounter for preprocedural laboratory examination: Secondary | ICD-10-CM | POA: Insufficient documentation

## 2018-10-03 DIAGNOSIS — M869 Osteomyelitis, unspecified: Secondary | ICD-10-CM

## 2018-10-03 LAB — CREATININE, SERUM
Creatinine, Ser: 0.7 mg/dL (ref 0.44–1.00)
GFR calc Af Amer: >60 mL/min (ref >60)
GFR calc non Af Amer: >60 mL/min (ref >60)

## 2018-10-03 LAB — BUN: BUN: 7 mg/dL (ref 6–20)

## 2018-10-03 NOTE — Telephone Encounter (Addendum)
-----   Message from Rip Harbour, [facility] sent at 09/12/2018  4:12 PM EDT ----- Regarding: MRI MRI with contrast left foot - evaluate osteomyelitis hallux left  Patient has been notified and will have blood work done and call to set up appt to her convenience

## 2018-10-03 NOTE — Telephone Encounter (Signed)
Per Ana P. With [facility], no precert is required for MRI

## 2018-10-12 ENCOUNTER — Ambulatory Visit: Payer: 59

## 2018-10-12 ENCOUNTER — Other Ambulatory Visit: Payer: Self-pay | Admitting: General Surgery

## 2018-10-12 DIAGNOSIS — Z1239 Encounter for other screening for malignant neoplasm of breast: Secondary | ICD-10-CM

## 2018-10-13 ENCOUNTER — Other Ambulatory Visit: Payer: Self-pay | Admitting: General Surgery

## 2018-10-13 DIAGNOSIS — Z1239 Encounter for other screening for malignant neoplasm of breast: Secondary | ICD-10-CM

## 2018-10-13 DIAGNOSIS — Z1231 Encounter for screening mammogram for malignant neoplasm of breast: Secondary | ICD-10-CM

## 2018-10-27 DIAGNOSIS — Z880 Allergy status to penicillin: Secondary | ICD-10-CM | POA: Diagnosis not present

## 2018-10-27 DIAGNOSIS — E559 Vitamin D deficiency, unspecified: Secondary | ICD-10-CM | POA: Diagnosis not present

## 2018-10-27 DIAGNOSIS — R911 Solitary pulmonary nodule: Secondary | ICD-10-CM | POA: Diagnosis not present

## 2018-10-27 DIAGNOSIS — L03032 Cellulitis of left toe: Secondary | ICD-10-CM | POA: Diagnosis not present

## 2018-10-31 ENCOUNTER — Other Ambulatory Visit: Payer: Self-pay

## 2018-10-31 ENCOUNTER — Ambulatory Visit
Admission: RE | Admit: 2018-10-31 | Discharge: 2018-10-31 | Disposition: A | Discharge status: Home / Self Care | Payer: 59 | Source: Ambulatory Visit | Attending: Podiatry | Admitting: Podiatry

## 2018-10-31 DIAGNOSIS — M869 Osteomyelitis, unspecified: Secondary | ICD-10-CM | POA: Diagnosis not present

## 2018-10-31 DIAGNOSIS — M7989 Other specified soft tissue disorders: Secondary | ICD-10-CM | POA: Diagnosis not present

## 2018-10-31 MED ORDER — GADOBUTROL 1 MMOL/ML IV SOLN
5.0000 mL | Freq: Once | INTRAVENOUS | Status: AC | PRN
  Administered 2018-10-31: 5 mL via INTRAVENOUS

## 2018-11-11 ENCOUNTER — Telehealth: Payer: Self-pay | Admitting: *Deleted

## 2018-11-11 NOTE — Telephone Encounter (Signed)
-----   Message from Garrel Ridgel, [place] sent at 11/09/2018  8:04 AM EDT ----- Still waiting on over read.

## 2018-11-11 NOTE — Telephone Encounter (Signed)
[facility] - Gabriel Cirri states has not received a disc. Tracey Walters

## 2018-11-11 NOTE — Telephone Encounter (Signed)
I have the disc, picked it up yesterday and Im going to drop it off at [facility] today

## 2018-11-14 ENCOUNTER — Telehealth: Payer: Self-pay

## 2018-11-14 NOTE — Telephone Encounter (Signed)
-----   Message from Garrel Ridgel, [place] sent at 11/07/2018  1:04 PM EDT ----- Send for over read STAT.  Inform the over read group that she does NOT have lung cancer.  I doubt a metastasis is the issue.  Does have a history of infection in the toe.  Has raynauds and bowls 4days/week for about 4hrs/day.

## 2018-11-14 NOTE — Telephone Encounter (Signed)
MRI disc was dropped off at [facility] on Friday 11/11/2018

## 2018-11-17 ENCOUNTER — Encounter: Payer: Self-pay | Admitting: Podiatry

## 2018-11-17 ENCOUNTER — Telehealth: Payer: Self-pay

## 2018-11-17 NOTE — Telephone Encounter (Signed)
Please call patient to schedule f/u MRI results with Dr. Milinda Pointer for next week.  You can put her on my schedule if needed.  Thanks!

## 2018-11-17 NOTE — Telephone Encounter (Signed)
-----   Message from Garrel Ridgel, [place] sent at 11/07/2018  1:04 PM EDT ----- Send for over read STAT.  Inform the over read group that she does NOT have lung cancer.  I doubt a metastasis is the issue.  Does have a history of infection in the toe.  Has raynauds and bowls 4days/week for about 4hrs/day.

## 2018-11-18 ENCOUNTER — Encounter: Payer: Self-pay | Admitting: Podiatry

## 2018-11-23 ENCOUNTER — Ambulatory Visit: Payer: 59 | Admitting: Podiatry

## 2018-11-23 ENCOUNTER — Other Ambulatory Visit: Payer: Self-pay

## 2018-11-23 ENCOUNTER — Encounter: Payer: Self-pay | Admitting: Podiatry

## 2018-11-23 ENCOUNTER — Ambulatory Visit (INDEPENDENT_AMBULATORY_CARE_PROVIDER_SITE_OTHER): Payer: 59 | Admitting: Podiatry

## 2018-11-23 DIAGNOSIS — M869 Osteomyelitis, unspecified: Secondary | ICD-10-CM

## 2018-11-23 DIAGNOSIS — M79675 Pain in left toe(s): Secondary | ICD-10-CM | POA: Diagnosis not present

## 2018-11-23 DIAGNOSIS — M898X9 Other specified disorders of bone, unspecified site: Secondary | ICD-10-CM | POA: Diagnosis not present

## 2018-11-23 NOTE — Patient Instructions (Signed)
Pre-Operative Instructions  Congratulations, you have decided to take an important step towards improving your quality of life.  You can be assured that the doctors and staff at [facility] will be with you every step of the way.  Here are some important things you should know:  1. Plan to be at the surgery center/hospital at least 1 (one) hour prior to your scheduled time, unless otherwise directed by the surgical center/hospital staff.  You must have a responsible adult accompany you, remain during the surgery and drive you home.  Make sure you have directions to the surgical center/hospital to ensure you arrive on time. 2. If you are having surgery at [facility] or [facility] hospitals, you will need a copy of your medical history and physical form from your family physician within one month prior to the date of surgery. We will give you a form for your primary physician to complete.  3. We make every effort to accommodate the date you request for surgery.  However, there are times where surgery dates or times have to be moved.  We will contact you as soon as possible if a change in schedule is required.   4. No aspirin/ibuprofen for one week before surgery.  If you are on aspirin, any non-steroidal anti-inflammatory medications (Mobic, Aleve, Ibuprofen) should not be taken seven (7) days prior to your surgery.  You make take Tylenol for pain prior to surgery.  5. Medications - If you are taking daily heart and blood pressure medications, seizure, reflux, allergy, asthma, anxiety, pain or diabetes medications, make sure you notify the surgery center/hospital before the day of surgery so they can tell you which medications you should take or avoid the day of surgery. 6. No food or drink after midnight the night before surgery unless directed otherwise by surgical center/hospital staff. 7. No alcoholic beverages 24-hours prior to surgery.  No smoking 24-hours prior or 24-hours after  surgery. 8. Wear loose pants or shorts. They should be loose enough to fit over bandages, boots, and casts. 9. Don't wear slip-on shoes. Sneakers are preferred. 10. Bring your boot with you to the surgery center/hospital.  Also bring crutches or a walker if your physician has prescribed it for you.  If you do not have this equipment, it will be provided for you after surgery. 11. If you have not been contacted by the surgery center/hospital by the day before your surgery, call to confirm the date and time of your surgery. 12. Leave-time from work may vary depending on the type of surgery you have.  Appropriate arrangements should be made prior to surgery with your employer. 13. Prescriptions will be provided immediately following surgery by your doctor.  Fill these as soon as possible after surgery and take the medication as directed. Pain medications will not be refilled on weekends and must be approved by the doctor. 14. Remove nail polish on the operative foot and avoid getting pedicures prior to surgery. 15. Wash the night before surgery.  The night before surgery wash the foot and leg well with water and the antibacterial soap provided. Be sure to pay special attention to beneath the toenails and in between the toes.  Wash for at least three (3) minutes. Rinse thoroughly with water and dry well with a towel.  Perform this wash unless told not to do so by your physician.  Enclosed: 1 Ice pack (please put in freezer the night before surgery)   1 Hibiclens skin cleaner     Pre-op instructions  If you have any questions regarding the instructions, please do not hesitate to call our office.  [place]: [address], [place], [place] [address] -- [phone]  [place]: [address] , [place] [address] -- [phone]  [place]: [address]  [place],  [address] -- [phone]   Website: [email or web address] 

## 2018-11-24 ENCOUNTER — Telehealth: Payer: Self-pay | Admitting: *Deleted

## 2018-11-24 NOTE — Telephone Encounter (Signed)
"  I saw Dr. Posey Pronto yesterday in [place].  He's talking about doing surgery on my foot this coming Monday.  If somebody could, call me back.

## 2018-11-24 NOTE — Telephone Encounter (Signed)
I placed the paperwork on your desk during lunch. Sorry  Thanks  Boneta Lucks

## 2018-11-24 NOTE — Telephone Encounter (Signed)
"  I'm calling about my surgery that is supposed to be scheduled for Monday."  Dr. Posey Pronto does have time available for Monday.  I'm waiting on the [place] office to send me the paperwork.  Someone from the surgical center will probably call you tomorrow and give you an arrival time.  You need to go online and register via the surgical center's online portal, if you have access to a computer.  The instructions are in the brochure that we gave you.  "Alright, I will do that.  So, they should call me tomorrow and let me know what time to be there?"  Yes, that is correct.   (Please send the paperwork.)

## 2018-11-25 ENCOUNTER — Telehealth: Payer: Self-pay | Admitting: *Deleted

## 2018-11-25 ENCOUNTER — Encounter: Payer: Self-pay | Admitting: Podiatry

## 2018-11-25 NOTE — Telephone Encounter (Addendum)
DOS 11/28/2018 HALLUX AMPUTATION - [id number] AND BONE BIOPSY - 20245 LEFT FOOT  [facility]: Eligibility Date - 01/12/2018  Benefit percentage  60%Plan pays  40%You pay  Individual deductible $0.00 to go  $300.00 out of $300  Individual integrated out-of-pocket $3,671.39 to go  $4,228.61 out of 123456   Pre-certification is NOT REQUIRED.

## 2018-11-25 NOTE — Progress Notes (Signed)
Subjective:  Patient ID: Tracey Walters, female    DOB: February 20, 1960,  MRN: [id number]  Chief Complaint  Patient presents with  . Toe Pain    follow up left hallux osteomyelitis    here to discuss MRI    58 y.o. female presents with the above complaint.  Is a 74 female who presents today with painful left hallux with underlying Raynaud's phenomenon.  She states that it has been swollen for a while now she denies any injuries to the area.  She is well-known to Dr. Milinda Pointer who obtained an MRI of the left hallux.  Patient is here to have it reviewed and discuss further surgical intervention.  Patient has been taking her Levaquin and has completed her course.  Dr. Milinda Pointer was obtaining the MRI to rule out osteomyelitis and a possible neoplasm/metastasis.  She states that it is still aggravating to the left hallux.  She has not been able to ambulate properly because of the pain.  She has tried all conservative therapy.  She states that she wishes to electively remove the left first hallux via amputation.  Patient states that she has a history of lung nodules but she states she has not been diagnosed with any cancer that she is aware of.  She denies any nausea fever chills vomiting shortness of breath.   Review of Systems: Negative except as noted in the HPI. Denies N/V/F/Ch.  Past Medical History:  Diagnosis Date  . Anxiety   . Carpal tunnel syndrome    right wrist  . Dyspnea   . GERD (gastroesophageal reflux disease)   . Hyperlipidemia   . Hypertension 20 yrs  . Raynaud's phenomenon   . 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 Ulcer     Current Outpatient Medications:  .  ALPRAZolam (XANAX) 0.25 MG tablet, Take 0.25 mg by mouth at bedtime as needed for anxiety or sleep., Disp: , Rfl:  .  amLODipine-benazepril (LOTREL) 5-10 MG capsule, Take 1 capsule by mouth daily. , Disp: , Rfl:  .  aspirin 81 MG tablet, Take 81 mg by mouth daily., Disp: , Rfl:  .  Calcium-Vitamin D-Vitamin K (CHEWABLE CALCIUM PO),  Take 1 each by mouth daily. Viactiv, Disp: , Rfl:  .  Cholecalciferol (VITAMIN D-3) 125 MCG (5000 UT) TABS, Take 5,000 Units by mouth daily. , Disp: , Rfl:  .  clindamycin (CLEOCIN) 150 MG capsule, Take 1 capsule (150 mg total) by mouth 3 (three) times daily., Disp: 30 capsule, Rfl: 1 .  Cyanocobalamin (VITAMIN B 12 PO), Take 1,000 mg by mouth daily., Disp: , Rfl:  .  gabapentin (NEURONTIN) 300 MG capsule, Take 300 mg by mouth daily as needed (pain). , Disp: , Rfl: 3 .  levofloxacin (LEVAQUIN) 750 MG tablet, Take 1 tablet (750 mg total) by mouth daily., Disp: 30 tablet, Rfl: 1 .  pantoprazole (PROTONIX) 40 MG tablet, Take 40 mg by mouth daily., Disp: , Rfl:  .  traMADol (ULTRAM) 50 MG tablet, Take 50 mg by mouth 2 (two) times daily., Disp: , Rfl:  .  varenicline (CHANTIX CONTINUING MONTH PAK) 1 MG tablet, Take 1 tablet (1 mg total) by mouth 2 (two) times daily., Disp: 50 tablet, Rfl: 2 .  varenicline (CHANTIX STARTING MONTH PAK) 0.5 MG X 11 & 1 MG X 42 tablet, As directed in package, Disp: 53 tablet, Rfl: 0  Social History   Tobacco Use  Smoking Status Current Every Day Smoker  . Packs/day: 2.00  . Years: 42.00  .  Pack years: 84.00  . Types: Cigarettes  Smokeless Tobacco Never Used  Tobacco Comment   currently 2 packs/day    Allergies  Allergen Reactions  . Penicillins Hives    DID THE REACTION INVOLVE: Swelling of the face/tongue/throat, SOB, or low BP? No Sudden or severe rash/hives, skin peeling, or the inside of the mouth or nose? No Did it require medical treatment? No When did it last happen?unkn If all above answers are "NO", may proceed with cephalosporin use.    Objective:  There were no vitals filed for this visit. There is no height or weight on file to calculate BMI. Constitutional Well developed. Well nourished.  Vascular Dorsalis pedis pulses palpable bilaterally. Posterior tibial pulses palpable bilaterally. Capillary refill normal to all digits.  No  cyanosis or clubbing noted. Pedal hair growth normal.  Neurologic Normal speech. Oriented to person, place, and time. Epicritic sensation to light touch grossly present bilaterally.  Dermatologic Nails well groomed and normal in appearance. No open wounds. No skin lesions.  Orthopedic:  Pain on palpation to the left hallux.  No wound noted.  No erythema cellulitis or any other clinical signs of infection noted.  Nailbed is intact without any signs of loosening.  Nail is attached and adhered to the underlying nail bed.  Mild swelling noted circumferentially around the toe.   Radiographs: See chart for detailed MRI read.  According to the MRI via [facility] it shows diffuse abnormal marrow and heterogeneous enhancement distal phalanx.  Expansile mass present.  While this could reflect a benign fibro-osseous lesion such as aneurysmal bone cyst complex epidermal inclusion cyst or glomus tumor metastatic disease should be excluded and sampling is recommended partially treated or smoldering osteomyelitis is also a possibility surrounding inflammation likely secondary to pathologic fracture no healing response. Assessment:  No diagnosis found. Plan:  Patient was evaluated and treated and all questions answered.  Left hallux benign versus metastatic versus osteomyelitis distal phalanx noted. -I believe patient will benefit from amputation at this point given how much pain she is experiencing and given how the radiograph reads as well as the MRI reads for leaning towards bone tumor.  Patient also agrees with this decision she would just like to proceed with removing/amputating the digit.  I explained to her that that the other possible option is to just obtain a bone biopsy of the digit to get a more definitive diagnosis.  However patient elected to undergo amputation electively. -I will proceed with left foot partial hallux amputation with primary closure.  I will send clean margins to  pathology to make sure complete excision of the margins was achieved. Informed surgical risk consent was reviewed and read aloud to the patient.  I reviewed the films.  I have discussed my findings with the patient in great detail.  I have discussed all risks including but not limited to infection, stiffness, scarring, limp, disability, deformity, damage to blood vessels and nerves, numbness, poor healing, need for braces, arthritis, chronic pain, amputation, death.  All benefits and realistic expectations discussed in great detail.  I have made no promises as to the outcome.  I have provided realistic expectations.  I have offered the patient a 2nd opinion, which they have declined and assured me they preferred to proceed despite the risks   No follow-ups on file.

## 2018-11-28 ENCOUNTER — Ambulatory Visit
Admission: RE | Admit: 2018-11-28 | Discharge: 2018-11-28 | Disposition: A | Discharge status: Home / Self Care | Payer: 59 | Source: Ambulatory Visit | Attending: General Surgery | Admitting: General Surgery

## 2018-11-28 DIAGNOSIS — M86172 Other acute osteomyelitis, left ankle and foot: Secondary | ICD-10-CM | POA: Diagnosis not present

## 2018-11-28 DIAGNOSIS — M86672 Other chronic osteomyelitis, left ankle and foot: Secondary | ICD-10-CM | POA: Diagnosis not present

## 2018-11-28 DIAGNOSIS — Z1231 Encounter for screening mammogram for malignant neoplasm of breast: Secondary | ICD-10-CM | POA: Diagnosis not present

## 2018-11-28 DIAGNOSIS — D480 Neoplasm of uncertain behavior of bone and articular cartilage: Secondary | ICD-10-CM | POA: Diagnosis not present

## 2018-11-28 DIAGNOSIS — M86072 Acute hematogenous osteomyelitis, left ankle and foot: Secondary | ICD-10-CM | POA: Diagnosis not present

## 2018-11-28 MED ORDER — OXYCODONE-ACETAMINOPHEN 10-325 MG PO TABS: 1.0000 | ORAL_TABLET | Freq: Four times a day (QID) | ORAL | 0 refills | Status: AC | PRN

## 2018-11-28 MED ORDER — IBUPROFEN 800 MG PO TABS: 800.0000 mg | ORAL_TABLET | Freq: Four times a day (QID) | ORAL | 1 refills | Status: DC | PRN

## 2018-11-28 NOTE — Addendum Note (Signed)
Addended by: Boneta Lucks on: 11/28/2018 09:50 AM   Modules accepted: Orders

## 2018-12-02 ENCOUNTER — Encounter: Payer: Self-pay | Admitting: Podiatry

## 2018-12-05 ENCOUNTER — Encounter: Payer: 59 | Admitting: Podiatry

## 2018-12-06 ENCOUNTER — Other Ambulatory Visit: Payer: Self-pay

## 2018-12-06 ENCOUNTER — Ambulatory Visit: Payer: 59 | Admitting: Surgery

## 2018-12-06 ENCOUNTER — Ambulatory Visit (INDEPENDENT_AMBULATORY_CARE_PROVIDER_SITE_OTHER): Payer: 59 | Admitting: Podiatry

## 2018-12-06 ENCOUNTER — Other Ambulatory Visit: Payer: 59

## 2018-12-06 ENCOUNTER — Ambulatory Visit (INDEPENDENT_AMBULATORY_CARE_PROVIDER_SITE_OTHER): Payer: 59

## 2018-12-06 DIAGNOSIS — Z9889 Other specified postprocedural states: Secondary | ICD-10-CM

## 2018-12-06 DIAGNOSIS — M898X9 Other specified disorders of bone, unspecified site: Secondary | ICD-10-CM | POA: Diagnosis not present

## 2018-12-06 MED ORDER — DOXYCYCLINE HYCLATE 100 MG PO TABS: 100.0000 mg | ORAL_TABLET | Freq: Two times a day (BID) | ORAL | 0 refills | Status: DC

## 2018-12-09 ENCOUNTER — Encounter: Payer: Self-pay | Admitting: Podiatry

## 2018-12-09 NOTE — Progress Notes (Signed)
Subjective:  Patient ID: Tracey Walters, female    DOB: 01/16/1960,  MRN: [id number]  No chief complaint on file.   DOS: 11/28/2018 Procedure: Left hallux amputation with biopsy of the clean margin  58 y.o. female returns for post-op check.  Patient is doing well.  She denies any acute pain.  She states that she has phantom pain.  She denies any other acute complaints.  She has been ambulating with surgical shoe.  Review of Systems: Negative except as noted in the HPI. Denies N/V/F/Ch.  Past Medical History:  Diagnosis Date  . Anxiety   . Carpal tunnel syndrome    right wrist  . Dyspnea   . GERD (gastroesophageal reflux disease)   . Hyperlipidemia   . Hypertension 20 yrs  . Raynaud's phenomenon   . Rheumatoid arteritis ([place])   . Ulcer     Current Outpatient Medications:  .  ALPRAZolam (XANAX) 0.25 MG tablet, Take 0.25 mg by mouth at bedtime as needed for anxiety or sleep., Disp: , Rfl:  .  amLODipine-benazepril (LOTREL) 5-10 MG capsule, Take 1 capsule by mouth daily. , Disp: , Rfl:  .  aspirin 81 MG tablet, Take 81 mg by mouth daily., Disp: , Rfl:  .  Calcium-Vitamin D-Vitamin K (CHEWABLE CALCIUM PO), Take 1 each by mouth daily. Viactiv, Disp: , Rfl:  .  Cholecalciferol (VITAMIN D-3) 125 MCG (5000 UT) TABS, Take 5,000 Units by mouth daily. , Disp: , Rfl:  .  clindamycin (CLEOCIN) 150 MG capsule, Take 1 capsule (150 mg total) by mouth 3 (three) times daily., Disp: 30 capsule, Rfl: 1 .  Cyanocobalamin (VITAMIN B 12 PO), Take 1,000 mg by mouth daily., Disp: , Rfl:  .  doxycycline (VIBRA-TABS) 100 MG tablet, Take 1 tablet (100 mg total) by mouth 2 (two) times daily., Disp: 20 tablet, Rfl: 0 .  gabapentin (NEURONTIN) 300 MG capsule, Take 300 mg by mouth daily as needed (pain). , Disp: , Rfl: 3 .  ibuprofen (ADVIL) 800 MG tablet, Take 1 tablet (800 mg total) by mouth every 6 (six) hours as needed., Disp: 60 tablet, Rfl: 1 .  levofloxacin (LEVAQUIN) 750 MG tablet, Take 1 tablet  (750 mg total) by mouth daily., Disp: 30 tablet, Rfl: 1 .  pantoprazole (PROTONIX) 40 MG tablet, Take 40 mg by mouth daily., Disp: , Rfl:  .  traMADol (ULTRAM) 50 MG tablet, Take 50 mg by mouth 2 (two) times daily., Disp: , Rfl:  .  varenicline (CHANTIX CONTINUING MONTH PAK) 1 MG tablet, Take 1 tablet (1 mg total) by mouth 2 (two) times daily., Disp: 50 tablet, Rfl: 2 .  varenicline (CHANTIX STARTING MONTH PAK) 0.5 MG X 11 & 1 MG X 42 tablet, As directed in package, Disp: 53 tablet, Rfl: 0  Social History   Tobacco Use  Smoking Status Current Every Day Smoker  . Packs/day: 2.00  . Years: 42.00  . Pack years: 84.00  . Types: Cigarettes  Smokeless Tobacco Never Used  Tobacco Comment   currently 2 packs/day    Allergies  Allergen Reactions  . Penicillins Hives    DID THE REACTION INVOLVE: Swelling of the face/tongue/throat, SOB, or low BP? No Sudden or severe rash/hives, skin peeling, or the inside of the mouth or nose? No Did it require medical treatment? No When did it last happen?unkn If all above answers are "NO", may proceed with cephalosporin use.    Objective:  There were no vitals filed for this visit. There is no  height or weight on file to calculate BMI. Constitutional Well developed. Well nourished.  Vascular Foot warm and well perfused. Capillary refill normal to all digits.   Neurologic Normal speech. Oriented to person, place, and time. Epicritic sensation to light touch grossly present bilaterally.  Dermatologic Skin healing well without signs of infection. Skin edges well coapted without signs of infection.  Orthopedic: Tenderness to palpation noted about the surgical site.   Radiographs: 3 views of skeletally mature adult: Sharp surgical margin noted at the amputation site of the hallux left foot.  No cortical irregularity noted.  No other bony deformities noted. Assessment:   1. Bone destruction   2. S/P foot surgery, left    Plan:  Patient was  evaluated and treated and all questions answered.  S/p foot surgery left -Progressing as expected post-operatively. -XR: See above -WB Status: Weightbearing as tolerated in boot -Sutures: Sutures intact.  No signs of dehiscence.  Mild erythema noted around the left hallux. -Medications: Doxycycline was dispensed for skin and soft tissue prophylaxis. -Foot redressed.  No follow-ups on file.

## 2018-12-12 ENCOUNTER — Other Ambulatory Visit: Payer: Self-pay

## 2018-12-12 ENCOUNTER — Ambulatory Visit
Admission: RE | Admit: 2018-12-12 | Discharge: 2018-12-12 | Disposition: A | Discharge status: Home / Self Care | Payer: 59 | Source: Ambulatory Visit | Attending: Pulmonary Disease | Admitting: Pulmonary Disease

## 2018-12-12 DIAGNOSIS — R911 Solitary pulmonary nodule: Secondary | ICD-10-CM | POA: Insufficient documentation

## 2018-12-13 ENCOUNTER — Ambulatory Visit (INDEPENDENT_AMBULATORY_CARE_PROVIDER_SITE_OTHER): Payer: 59 | Admitting: Podiatry

## 2018-12-13 DIAGNOSIS — M869 Osteomyelitis, unspecified: Secondary | ICD-10-CM

## 2018-12-13 DIAGNOSIS — Z9889 Other specified postprocedural states: Secondary | ICD-10-CM

## 2018-12-13 DIAGNOSIS — M898X9 Other specified disorders of bone, unspecified site: Secondary | ICD-10-CM

## 2018-12-14 ENCOUNTER — Encounter: Payer: Self-pay | Admitting: Podiatry

## 2018-12-14 ENCOUNTER — Other Ambulatory Visit: Payer: Self-pay

## 2018-12-14 DIAGNOSIS — R918 Other nonspecific abnormal finding of lung field: Secondary | ICD-10-CM

## 2018-12-14 NOTE — Progress Notes (Signed)
Subjective:  Patient ID: Tracey Walters, female    DOB: 02-02-1960,  MRN: [id number]  Chief Complaint  Patient presents with  . Follow-up    POV#2 DOS 11/28/2018 HALLUX AMPUTATION AND BONE BIOPSY LT - suture removal      58 y.o. female returns for post-op check.  Patient presents with hallux amputation and bone biopsy with clean margins of the left foot.  Her sutures are intact without any signs of dehiscence.  She has been ambulating with a cam boot.  She denies any other acute complaints.  I will also go over her results with her as well.  Review of Systems: Negative except as noted in the HPI. Denies N/V/F/Ch.  Past Medical History:  Diagnosis Date  . Anxiety   . Carpal tunnel syndrome    right wrist  . Dyspnea   . GERD (gastroesophageal reflux disease)   . Hyperlipidemia   . Hypertension 20 yrs  . Raynaud's phenomenon   . 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 Ulcer     Current Outpatient Medications:  .  ALPRAZolam (XANAX) 0.25 MG tablet, Take 0.25 mg by mouth at bedtime as needed for anxiety or sleep., Disp: , Rfl:  .  amLODipine-benazepril (LOTREL) 5-10 MG capsule, Take 1 capsule by mouth daily. , Disp: , Rfl:  .  aspirin 81 MG tablet, Take 81 mg by mouth daily., Disp: , Rfl:  .  Cyanocobalamin (VITAMIN B 12 PO), Take 1,000 mg by mouth daily., Disp: , Rfl:  .  doxycycline (VIBRA-TABS) 100 MG tablet, Take 1 tablet (100 mg total) by mouth 2 (two) times daily., Disp: 20 tablet, Rfl: 0 .  gabapentin (NEURONTIN) 300 MG capsule, Take 300 mg by mouth daily as needed (pain). , Disp: , Rfl: 3 .  ibuprofen (ADVIL) 800 MG tablet, Take 1 tablet (800 mg total) by mouth every 6 (six) hours as needed., Disp: 60 tablet, Rfl: 1 .  pantoprazole (PROTONIX) 40 MG tablet, Take 40 mg by mouth daily., Disp: , Rfl:  .  traMADol (ULTRAM) 50 MG tablet, Take 50 mg by mouth 2 (two) times daily., Disp: , Rfl:  .  Calcium-Vitamin D-Vitamin K (CHEWABLE CALCIUM PO), Take 1 each by mouth daily. Viactiv,  Disp: , Rfl:  .  Cholecalciferol (VITAMIN D-3) 125 MCG (5000 UT) TABS, Take 5,000 Units by mouth daily. , Disp: , Rfl:  .  clindamycin (CLEOCIN) 150 MG capsule, Take 1 capsule (150 mg total) by mouth 3 (three) times daily., Disp: 30 capsule, Rfl: 1 .  levofloxacin (LEVAQUIN) 750 MG tablet, Take 1 tablet (750 mg total) by mouth daily., Disp: 30 tablet, Rfl: 1 .  varenicline (CHANTIX CONTINUING MONTH PAK) 1 MG tablet, Take 1 tablet (1 mg total) by mouth 2 (two) times daily., Disp: 50 tablet, Rfl: 2 .  varenicline (CHANTIX STARTING MONTH PAK) 0.5 MG X 11 & 1 MG X 42 tablet, As directed in package, Disp: 53 tablet, Rfl: 0  Social History   Tobacco Use  Smoking Status Current Every Day Smoker  . Packs/day: 2.00  . Years: 42.00  . Pack years: 84.00  . Types: Cigarettes  Smokeless Tobacco Never Used  Tobacco Comment   currently 2 packs/day    Allergies  Allergen Reactions  . Penicillins Hives    DID THE REACTION INVOLVE: Swelling of the face/tongue/throat, SOB, or low BP? No Sudden or severe rash/hives, skin peeling, or the inside of the mouth or nose? No Did it require medical treatment? No When did it  last happen?unkn If all above answers are "NO", may proceed with cephalosporin use.    Objective:  There were no vitals filed for this visit. There is no height or weight on file to calculate BMI. Constitutional Well developed. Well nourished.  Vascular Foot warm and well perfused. Capillary refill normal to all digits.   Neurologic Normal speech. Oriented to person, place, and time. Epicritic sensation to light touch grossly present bilaterally.  Dermatologic Skin healing well without signs of infection. Skin edges well coapted without signs of infection.  Orthopedic: Tenderness to palpation noted about the surgical site.   Radiographs: None Assessment:   1. S/P foot surgery, left   2. 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 Bone destruction    Plan:  Patient was evaluated  and treated and all questions answered.  S/p foot surgery left -Progressing as expected post-operatively. -XR: None -WB Status: Cam boot weightbearing as tolerated we will start transitioning her to regular sneakers -Sutures: Sutures were removed no signs of dehiscence.  No clinical signs of infection noted. -Medications: None -Foot redressed. -I will see her back 1 more time in about 3 weeks if she is doing well this will be my last time I see her. -I went over her results for clean margins as well as pathology of the toe that was sent and I discussed and reviewed the findings.  It came back as osteomyelitis without any signs of malignancies.  The clean margins were also clear of any infection.  Therefore she does not need long-term antibiotics as she achieved surgical cure with amputation.  No follow-ups on file.

## 2018-12-15 ENCOUNTER — Ambulatory Visit: Payer: 59 | Admitting: Pulmonary Disease

## 2018-12-15 DIAGNOSIS — F17210 Nicotine dependence, cigarettes, uncomplicated: Secondary | ICD-10-CM

## 2018-12-15 DIAGNOSIS — R911 Solitary pulmonary nodule: Secondary | ICD-10-CM

## 2018-12-15 NOTE — Patient Instructions (Signed)
1.  They previously noted nodules in the lung have cleared.  The only nodule is the one that has been present for quite a long time.  2.  We will see you in follow-up in 6 months with a chest CT at that time.

## 2018-12-19 ENCOUNTER — Other Ambulatory Visit: Payer: 59 | Admitting: Podiatry

## 2018-12-20 ENCOUNTER — Ambulatory Visit (INDEPENDENT_AMBULATORY_CARE_PROVIDER_SITE_OTHER): Payer: 59

## 2018-12-20 ENCOUNTER — Ambulatory Visit: Payer: 59

## 2018-12-20 ENCOUNTER — Ambulatory Visit (INDEPENDENT_AMBULATORY_CARE_PROVIDER_SITE_OTHER): Payer: 59 | Admitting: Podiatry

## 2018-12-20 ENCOUNTER — Other Ambulatory Visit: Payer: Self-pay

## 2018-12-20 DIAGNOSIS — M869 Osteomyelitis, unspecified: Secondary | ICD-10-CM

## 2018-12-20 DIAGNOSIS — Z9889 Other specified postprocedural states: Secondary | ICD-10-CM

## 2018-12-20 DIAGNOSIS — Z1211 Encounter for screening for malignant neoplasm of colon: Secondary | ICD-10-CM | POA: Diagnosis not present

## 2018-12-21 ENCOUNTER — Encounter: Payer: Self-pay | Admitting: Podiatry

## 2018-12-21 NOTE — Progress Notes (Signed)
Subjective:  Patient ID: Tracey Walters, female    DOB: [birth year],  MRN: [id number]  Chief Complaint  Patient presents with  . Routine Post Op    POV#3 DOS 11/28/2018 HALLUX AMPUTATION AND BONE BIOPSY LT     58 y.o. female returns for post-op check.  Patient presents with hallux amputation and bone biopsy with clean margins of the left foot.  She states she is doing really well.  No pain at all.  There is some scab formation across the incision site as expected.  She has completely healed and reepithelialized the incision.  She denies any other acute complaints.  She has been ambulating in regular sneakers.  Review of Systems: Negative except as noted in the HPI. Denies N/V/F/Ch.  Past Medical History:  Diagnosis Date  . Anxiety   . Carpal tunnel syndrome    right wrist  . Dyspnea   . GERD (gastroesophageal reflux disease)   . Hyperlipidemia   . Hypertension 20 yrs  . Raynaud's phenomenon   . 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 Ulcer     Current Outpatient Medications:  .  ALPRAZolam (XANAX) 0.25 MG tablet, Take 0.25 mg by mouth at bedtime as needed for anxiety or sleep., Disp: , Rfl:  .  amLODipine-benazepril (LOTREL) 5-10 MG capsule, Take 1 capsule by mouth daily. , Disp: , Rfl:  .  aspirin 81 MG tablet, Take 81 mg by mouth daily., Disp: , Rfl:  .  Cyanocobalamin (VITAMIN B 12 PO), Take 1,000 mg by mouth daily., Disp: , Rfl:  .  doxycycline (VIBRA-TABS) 100 MG tablet, Take 1 tablet (100 mg total) by mouth 2 (two) times daily., Disp: 20 tablet, Rfl: 0 .  gabapentin (NEURONTIN) 300 MG capsule, Take 300 mg by mouth daily as needed (pain). , Disp: , Rfl: 3 .  ibuprofen (ADVIL) 800 MG tablet, Take 1 tablet (800 mg total) by mouth every 6 (six) hours as needed., Disp: 60 tablet, Rfl: 1 .  pantoprazole (PROTONIX) 40 MG tablet, Take 40 mg by mouth daily., Disp: , Rfl:  .  traMADol (ULTRAM) 50 MG tablet, Take 50 mg by mouth 2 (two) times daily., Disp: , Rfl:  .  Calcium-Vitamin  D-Vitamin K (CHEWABLE CALCIUM PO), Take 1 each by mouth daily. Viactiv, Disp: , Rfl:  .  Cholecalciferol (VITAMIN D-3) 125 MCG (5000 UT) TABS, Take 5,000 Units by mouth daily. , Disp: , Rfl:  .  clindamycin (CLEOCIN) 150 MG capsule, Take 1 capsule (150 mg total) by mouth 3 (three) times daily., Disp: 30 capsule, Rfl: 1 .  levofloxacin (LEVAQUIN) 750 MG tablet, Take 1 tablet (750 mg total) by mouth daily., Disp: 30 tablet, Rfl: 1 .  varenicline (CHANTIX CONTINUING MONTH PAK) 1 MG tablet, Take 1 tablet (1 mg total) by mouth 2 (two) times daily., Disp: 50 tablet, Rfl: 2 .  varenicline (CHANTIX STARTING MONTH PAK) 0.5 MG X 11 & 1 MG X 42 tablet, As directed in package, Disp: 53 tablet, Rfl: 0  Social History   Tobacco Use  Smoking Status Current Every Day Smoker  . Packs/day: 2.00  . Years: 42.00  . Pack years: 84.00  . Types: Cigarettes  Smokeless Tobacco Never Used  Tobacco Comment   currently 2 packs/day    Allergies  Allergen Reactions  . Penicillins Hives    DID THE REACTION INVOLVE: Swelling of the face/tongue/throat, SOB, or low BP? No Sudden or severe rash/hives, skin peeling, or the inside of the mouth or nose? No  Did it require medical treatment? No When did it last happen?unkn If all above answers are "NO", may proceed with cephalosporin use.    Objective:  There were no vitals filed for this visit. There is no height or weight on file to calculate BMI. Constitutional Well developed. Well nourished.  Vascular Foot warm and well perfused. Capillary refill normal to all digits.   Neurologic Normal speech. Oriented to person, place, and time. Epicritic sensation to light touch grossly present bilaterally.  Dermatologic Skin healing well without signs of infection. Skin edges well coapted without signs of infection.  Skin incision completely reepithelialized and healed  Orthopedic: Tenderness to palpation noted about the surgical site.   Radiographs: None  Assessment:   1. S/P foot surgery, left    Plan:  Patient was evaluated and treated and all questions answered.  S/p foot surgery left -Progressing as expected post-operatively. -XR: X-rays were reviewed with the patient: 2 views of skeletally mature adult foot: Bone margins are sharp without any cortical irregularity noted.  No soft tissue emphysema get or gas noted.  No foreign body noted. -WB Status: Weightbearing as tolerated in regular sneakers -Given that the skin has completely reepithelialized with no concern for further infection, I believe this will be patient's last visit.  I explained to the patient that she can start getting it wet.  If patient has any further issues, I will be happy to see her back.  I am officially discharging her from my care at this time.  No follow-ups on file.

## 2018-12-27 ENCOUNTER — Other Ambulatory Visit: Payer: 59

## 2018-12-28 ENCOUNTER — Ambulatory Visit (INDEPENDENT_AMBULATORY_CARE_PROVIDER_SITE_OTHER): Payer: 59 | Admitting: Orthotics

## 2018-12-28 ENCOUNTER — Other Ambulatory Visit: Payer: Self-pay

## 2018-12-28 DIAGNOSIS — Z9889 Other specified postprocedural states: Secondary | ICD-10-CM

## 2018-12-28 DIAGNOSIS — M79675 Pain in left toe(s): Secondary | ICD-10-CM

## 2018-12-28 DIAGNOSIS — M869 Osteomyelitis, unspecified: Secondary | ICD-10-CM

## 2018-12-28 DIAGNOSIS — Z01812 Encounter for preprocedural laboratory examination: Secondary | ICD-10-CM

## 2019-01-26 DIAGNOSIS — R911 Solitary pulmonary nodule: Secondary | ICD-10-CM | POA: Diagnosis not present

## 2019-01-26 DIAGNOSIS — I7300 Raynaud's syndrome without gangrene: Secondary | ICD-10-CM | POA: Diagnosis not present

## 2019-01-26 DIAGNOSIS — E559 Vitamin D deficiency, unspecified: Secondary | ICD-10-CM | POA: Diagnosis not present

## 2019-01-26 DIAGNOSIS — E785 Hyperlipidemia, unspecified: Secondary | ICD-10-CM | POA: Diagnosis not present

## 2019-01-26 NOTE — Progress Notes (Signed)
Patient cast for f/o with toe filler/accommodative left

## 2019-02-01 ENCOUNTER — Other Ambulatory Visit: Payer: Self-pay

## 2019-02-01 ENCOUNTER — Ambulatory Visit (INDEPENDENT_AMBULATORY_CARE_PROVIDER_SITE_OTHER): Payer: 59 | Admitting: Orthotics

## 2019-02-01 ENCOUNTER — Encounter: Payer: Self-pay | Admitting: Pulmonary Disease

## 2019-02-01 DIAGNOSIS — M869 Osteomyelitis, unspecified: Secondary | ICD-10-CM

## 2019-02-01 DIAGNOSIS — Z9889 Other specified postprocedural states: Secondary | ICD-10-CM

## 2019-02-01 NOTE — Patient Instructions (Signed)
Repeat CT scan, has been scheduled.

## 2019-02-07 NOTE — Progress Notes (Signed)
Patient came in today to pick up custom made foot orthotics.  The goals were accomplished and the patient reported no dissatisfaction with said orthotics.  Patient was advised of breakin period and how to report any issues. 

## 2019-02-28 DIAGNOSIS — E559 Vitamin D deficiency, unspecified: Secondary | ICD-10-CM | POA: Diagnosis not present

## 2019-02-28 DIAGNOSIS — M8580 Other specified disorders of bone density and structure, unspecified site: Secondary | ICD-10-CM | POA: Diagnosis not present

## 2019-02-28 DIAGNOSIS — E785 Hyperlipidemia, unspecified: Secondary | ICD-10-CM | POA: Diagnosis not present

## 2019-02-28 DIAGNOSIS — R911 Solitary pulmonary nodule: Secondary | ICD-10-CM | POA: Diagnosis not present

## 2019-03-28 DIAGNOSIS — M8580 Other specified disorders of bone density and structure, unspecified site: Secondary | ICD-10-CM | POA: Diagnosis not present

## 2019-03-28 DIAGNOSIS — R911 Solitary pulmonary nodule: Secondary | ICD-10-CM | POA: Diagnosis not present

## 2019-03-28 DIAGNOSIS — I7300 Raynaud's syndrome without gangrene: Secondary | ICD-10-CM | POA: Diagnosis not present

## 2019-05-09 DIAGNOSIS — M19041 Primary osteoarthritis, right hand: Secondary | ICD-10-CM | POA: Diagnosis not present

## 2019-05-09 DIAGNOSIS — M19042 Primary osteoarthritis, left hand: Secondary | ICD-10-CM | POA: Diagnosis not present

## 2019-05-09 DIAGNOSIS — Z720 Tobacco use: Secondary | ICD-10-CM | POA: Diagnosis not present

## 2019-05-09 DIAGNOSIS — I7300 Raynaud's syndrome without gangrene: Secondary | ICD-10-CM | POA: Diagnosis not present

## 2019-05-24 ENCOUNTER — Other Ambulatory Visit
Admission: RE | Admit: 2019-05-24 | Discharge: 2019-05-24 | Disposition: A | Discharge status: Home / Self Care | Payer: 59 | Attending: Internal Medicine | Admitting: Internal Medicine

## 2019-05-24 ENCOUNTER — Other Ambulatory Visit: Payer: Self-pay

## 2019-05-24 ENCOUNTER — Other Ambulatory Visit: Payer: Self-pay | Admitting: *Deleted

## 2019-05-24 DIAGNOSIS — I7300 Raynaud's syndrome without gangrene: Secondary | ICD-10-CM | POA: Insufficient documentation

## 2019-05-24 MED ORDER — AMLODIPINE BESY-BENAZEPRIL HCL 5-10 MG PO CAPS: 1.0000 | ORAL_CAPSULE | Freq: Every day | ORAL | 3 refills | Status: DC

## 2019-05-25 LAB — MISC LABCORP TEST (SEND OUT)
Labcorp test code: 340897
Labcorp test code: 164814

## 2019-05-25 LAB — SCLERODERMA DIAGNOSTIC PROFILE
Anti Nuclear Antibody (ANA): NEGATIVE
Scleroderma (Scl-70) (ENA) Antibody, IgG: <0.2 AI (ref 0.0–0.9)

## 2019-06-02 ENCOUNTER — Other Ambulatory Visit: Payer: Self-pay

## 2019-06-02 DIAGNOSIS — R911 Solitary pulmonary nodule: Secondary | ICD-10-CM

## 2019-06-02 DIAGNOSIS — F17210 Nicotine dependence, cigarettes, uncomplicated: Secondary | ICD-10-CM

## 2019-06-02 NOTE — Progress Notes (Signed)
Ct chest ordered 

## 2019-06-15 ENCOUNTER — Other Ambulatory Visit: Payer: Self-pay | Admitting: *Deleted

## 2019-06-15 ENCOUNTER — Other Ambulatory Visit: Payer: Self-pay | Admitting: Internal Medicine

## 2019-06-15 NOTE — Telephone Encounter (Signed)
Patient was seen on 05/06/19 for Tramadol refill, patient was told to follow up in 3 months, but is due for her tramadol refill again. Patient states that she was told that she could call for refill. Patient is asking if tramadol could be sent to [facility].

## 2019-06-16 MED ORDER — TRAMADOL HCL 50 MG PO TABS: 50.0000 mg | ORAL_TABLET | Freq: Two times a day (BID) | ORAL | 0 refills | Status: DC

## 2019-06-16 NOTE — Telephone Encounter (Signed)
Note pending in Dr. Jennette Kettle inbasket.

## 2019-06-23 ENCOUNTER — Ambulatory Visit
Admission: RE | Admit: 2019-06-23 | Discharge: 2019-06-23 | Disposition: A | Discharge status: Home / Self Care | Payer: 59 | Source: Ambulatory Visit | Attending: Pulmonary Disease | Admitting: Pulmonary Disease

## 2019-06-23 ENCOUNTER — Other Ambulatory Visit: Payer: Self-pay

## 2019-06-23 DIAGNOSIS — R911 Solitary pulmonary nodule: Secondary | ICD-10-CM | POA: Diagnosis not present

## 2019-06-23 DIAGNOSIS — F17210 Nicotine dependence, cigarettes, uncomplicated: Secondary | ICD-10-CM | POA: Insufficient documentation

## 2019-07-19 ENCOUNTER — Other Ambulatory Visit: Payer: Self-pay | Admitting: *Deleted

## 2019-07-19 MED ORDER — TRAMADOL HCL 50 MG PO TABS: 50.0000 mg | ORAL_TABLET | Freq: Three times a day (TID) | ORAL | 0 refills | Status: DC

## 2019-07-21 ENCOUNTER — Encounter: Payer: 59 | Admitting: Internal Medicine

## 2019-07-27 ENCOUNTER — Other Ambulatory Visit: Payer: Self-pay

## 2019-07-27 ENCOUNTER — Ambulatory Visit: Payer: 59 | Admitting: Pulmonary Disease

## 2019-07-27 DIAGNOSIS — R911 Solitary pulmonary nodule: Secondary | ICD-10-CM

## 2019-07-27 NOTE — Progress Notes (Signed)
 Assessment & Plan:  There are no diagnoses linked to this encounter.  Patient Instructions  We will see you in follow-up in 6 months time.  We will refer you back to the lung cancer screening program.  Continue efforts to stop smoking.  Please note: late entry documentation due to logistical difficulties during COVID-19 pandemic. This note is filed for information purposes only, and is not intended to be used for billing, nor does it represent the full scope/nature of the visit in question. Please see any associated scanned media linked to date of encounter for additional pertinent information.  Subjective:    HPI: Tracey Walters is a 59 y.o. female presenting to the pulmonology clinic on 07/27/2019 with report of: No chief complaint on file.   Virtual Visit Via Video or Telephone Note:   This visit type was conducted due to national recommendations for restrictions regarding the COVID-19 pandemic .  This format is felt to be most appropriate for this patient at this time.  All issues noted in this document were discussed and addressed.  No physical exam was performed (except for noted visual exam findings with Video Visits).    I connected with XXX  by a video enabled telemedicine application or telephone at 3:50 PM and verified that I was speaking with the correct person using two identifiers. Location patient: home Location provider: [facility]- Persons participating in the virtual visit: patient, physician   I discussed the limitations, risks, security and privacy concerns of performing an evaluation and management service by video and the availability of in person appointments. The patient expressed understanding and agreed to proceed.  Outpatient Encounter Medications as of 07/27/2019  Medication Sig Note   [DISCONTINUED] ALPRAZolam  (XANAX ) 0.25 MG tablet Take 0.25 mg by mouth at bedtime as needed for anxiety or sleep.    [DISCONTINUED]  amLODipine -benazepril  (LOTREL ) 5-10 MG capsule Take 1 capsule by mouth daily.    [DISCONTINUED] aspirin 81 MG tablet Take 81 mg by mouth daily. (Patient not taking: Reported on 08/11/2019)    [DISCONTINUED] Calcium -Vitamin D -Vitamin K (CHEWABLE CALCIUM  PO) Take 1 each by mouth daily. Viactiv (Patient not taking: Reported on 08/11/2019)    [DISCONTINUED] Cholecalciferol (VITAMIN D -3) 125 MCG (5000 UT) TABS Take 5,000 Units by mouth daily.  (Patient not taking: Reported on 08/11/2019)    [DISCONTINUED] clindamycin  (CLEOCIN ) 150 MG capsule Take 1 capsule (150 mg total) by mouth 3 (three) times daily.    [DISCONTINUED] Cyanocobalamin  (VITAMIN B 12 PO) Take 1,000 mg by mouth daily.    [DISCONTINUED] doxycycline  (VIBRA -TABS) 100 MG tablet Take 1 tablet (100 mg total) by mouth 2 (two) times daily.    [DISCONTINUED] gabapentin (NEURONTIN) 300 MG capsule Take 300 mg by mouth daily as needed (pain).  10/14/2015: Received from: External Pharmacy   [DISCONTINUED] ibuprofen  (ADVIL ) 800 MG tablet Take 1 tablet (800 mg total) by mouth every 6 (six) hours as needed.    [DISCONTINUED] levofloxacin  (LEVAQUIN ) 750 MG tablet Take 1 tablet (750 mg total) by mouth daily.    [DISCONTINUED] pantoprazole  (PROTONIX ) 40 MG tablet Take 40 mg by mouth daily.    [DISCONTINUED] traMADol  (ULTRAM ) 50 MG tablet Take 1 tablet (50 mg total) by mouth 3 (three) times daily.    [DISCONTINUED] varenicline  (CHANTIX  CONTINUING MONTH PAK) 1 MG tablet Take 1 tablet (1 mg total) by mouth 2 (two) times daily. (Patient not taking: Reported on 08/11/2019)    [DISCONTINUED] varenicline  (CHANTIX  STARTING MONTH PAK) 0.5 MG X 11 &  1 MG X 42 tablet As directed in package    No facility-administered encounter medications on file as of 07/27/2019.      Objective:   There were no vitals filed for this visit.   Physical exam documentation is limited by delayed entry of information.    No physical exam performed as the visit was via telephone.  Patient  did not exhibit conversational dyspnea during the visit.

## 2019-07-27 NOTE — Patient Instructions (Signed)
We will see you in follow-up in 6 months time.  We will refer you back to the lung cancer screening program.  Continue efforts to stop smoking.

## 2019-07-31 ENCOUNTER — Ambulatory Visit: Payer: 59 | Admitting: Pulmonary Disease

## 2019-08-07 ENCOUNTER — Encounter: Payer: 59 | Admitting: Internal Medicine

## 2019-08-11 ENCOUNTER — Ambulatory Visit (INDEPENDENT_AMBULATORY_CARE_PROVIDER_SITE_OTHER): Payer: 59 | Admitting: Internal Medicine

## 2019-08-11 ENCOUNTER — Other Ambulatory Visit: Payer: Self-pay

## 2019-08-11 ENCOUNTER — Encounter: Payer: Self-pay | Admitting: Internal Medicine

## 2019-08-11 VITALS — BP 120/64 | HR 75 | Ht 63.0 in | Wt 104.1 lb

## 2019-08-11 DIAGNOSIS — J410 Simple chronic bronchitis: Secondary | ICD-10-CM

## 2019-08-11 DIAGNOSIS — I10 Essential (primary) hypertension: Secondary | ICD-10-CM | POA: Diagnosis not present

## 2019-08-11 DIAGNOSIS — I7300 Raynaud's syndrome without gangrene: Secondary | ICD-10-CM | POA: Diagnosis not present

## 2019-08-11 DIAGNOSIS — Z0000 Encounter for general adult medical examination without abnormal findings: Secondary | ICD-10-CM | POA: Diagnosis not present

## 2019-08-11 DIAGNOSIS — Z87891 Personal history of nicotine dependence: Secondary | ICD-10-CM | POA: Diagnosis not present

## 2019-08-11 NOTE — Assessment & Plan Note (Signed)
-   Today, the patient's blood pressure is well managed on Lotrel 5/10mg  1 capsule daily. - The patient will continue the current treatment regimen.  - I encouraged the patient to eat a low-sodium diet to help control blood pressure. - I encouraged the patient to live an active lifestyle and complete activities that increases heart rate to 85% target heart rate at least 5 times per week for one hour.

## 2019-08-11 NOTE — Assessment & Plan Note (Signed)
-   The patient's COPD is stable With/Without: with treatment.  - I informed the patient of the importance of getting routine flu and pneumonia vaccines.  - I instructed the patient to seek help if they experience more difficulty breathing than usual or if the pulse oximeter shows low oxygen for longer than 5 minutes.

## 2019-08-11 NOTE — Assessment & Plan Note (Signed)
Pt has chronic raynauds disease of both hands. She is being seen by vascular specialist and rheumatologist. She has been again advised to quit smoking.

## 2019-08-11 NOTE — Addendum Note (Signed)
Addended by: Lacretia Nicks L on: 08/11/2019 10:26 AM   Modules accepted: Orders

## 2019-08-11 NOTE — Assessment & Plan Note (Signed)
Pt was advised to have a colonoscopy and mammogram. Pt has yearly CT scan due to being a smoker

## 2019-08-11 NOTE — Assessment & Plan Note (Signed)
-   I instructed the patient to stop smoking and provided them with smoking cessation materials.  - I informed the patient that smoking puts them at increased risk for cancer, COPD, hypertension, and more.  - Informed the patient to seek help if they begin to have trouble breathing, develop chest pain, start to cough up blood, feel faint, or pass out.  

## 2019-08-11 NOTE — Progress Notes (Signed)
Established Patient Office Visit  SUBJECTIVE:  Subjective  Patient ID: Tracey Walters, female    DOB: 12-15-1960  Age: 59 y.o. MRN: [id number]  CC:  Chief Complaint  Patient presents with  . Annual Exam    HPI Tracey Walters is a 59 y.o. female presenting today for a physical. Pt has gotten her COVID19 vaccines. Her raynaud's syndrome is the same. She takes gabapentin when needed and she tries to keep hands warm. Pt does not take a nutritional supplement. She is still smoking 2 packs of cigarettes and she has not drank alcohol since 2016. Pt has gotten a CT scan recently and it was stable. She has gotten a mammogram but not a colonoscopy. Pt is planning on seeing the eye doctor. She saw a rheumatoid doctor but only got test from there and has not followed up since.     Past Medical History:  Diagnosis Date  . Anxiety   . Carpal tunnel syndrome    right wrist  . Dyspnea   . GERD (gastroesophageal reflux disease)   . Hyperlipidemia   . Hypertension 20 yrs  . Raynaud's phenomenon   . Rheumatoid arteritis ([place])   . Ulcer     Past Surgical History:  Procedure Laterality Date  . ABDOMINAL HYSTERECTOMY  1992  . BREAST BIOPSY Right 2011   neg  . BREAST BIOPSY Right 11/1996   Dense fibrosis,  benign epithelial hyperplasia.  Marland Kitchen CARPAL TUNNEL RELEASE Right 2012  . COLONOSCOPY  2010  . ENDOBRONCHIAL ULTRASOUND Right 01/25/2018   Procedure: ENDOBRONCHIAL ULTRASOUND;  Surgeon: Tyler Pita, MD;  Location: [facility] ORS;  Service: Cardiopulmonary;  Laterality: Right;  . TONSILLECTOMY    . TUBAL LIGATION    . UPPER GI ENDOSCOPY      Family History  Problem Relation Age of Onset  . Breast cancer Sister 33  . Breast cancer Maternal Aunt     Social History   Socioeconomic History  . Marital status: Divorced    Spouse name: Not on file  . Number of children: Not on file  . Years of education: Not on file  . Highest education level: Not on file  Occupational History  .  Not on file  Tobacco Use  . Smoking status: Current Every Day Smoker    Packs/day: 2.00    Years: 42.00    Pack years: 84.00    Types: Cigarettes  . Smokeless tobacco: Never Used  . Tobacco comment: currently 2 packs/day  Vaping Use  . Vaping Use: Never used  Substance and Sexual Activity  . Alcohol use: No  . Drug use: No  . Sexual activity: Not on file  Other Topics Concern  . Not on file  Social History Narrative  . Not on file   Social Determinants of Health   Financial Resource Strain:   . Difficulty of Paying Living Expenses:   Food Insecurity:   . Worried About Charity fundraiser in the Last Year:   . Arboriculturist in the Last Year:   Transportation Needs:   . Film/video editor (Medical):   Marland Kitchen Lack of Transportation (Non-Medical):   Physical Activity:   . Days of Exercise per Week:   . Minutes of Exercise per Session:   Stress:   . Feeling of Stress :   Social Connections:   . Frequency of Communication with Friends and Family:   . Frequency of Social Gatherings with Friends and Family:   .  Attends Religious Services:   . Active Member of Clubs or Organizations:   . Attends Archivist Meetings:   Marland Kitchen Marital Status:   Intimate Partner Violence:   . Fear of Current or Ex-Partner:   . Emotionally Abused:   Marland Kitchen Physically Abused:   . Sexually Abused:      Current Outpatient Medications:  .  ALPRAZolam (XANAX) 0.25 MG tablet, Take 0.25 mg by mouth at bedtime as needed for anxiety or sleep., Disp: , Rfl:  .  amLODipine-benazepril (LOTREL) 5-10 MG capsule, Take 1 capsule by mouth daily., Disp: 90 capsule, Rfl: 3 .  Cyanocobalamin (VITAMIN B 12 PO), Take 1,000 mg by mouth daily., Disp: , Rfl:  .  gabapentin (NEURONTIN) 300 MG capsule, Take 300 mg by mouth daily as needed (pain). , Disp: , Rfl: 3 .  pantoprazole (PROTONIX) 40 MG tablet, Take 40 mg by mouth daily., Disp: , Rfl:  .  traMADol (ULTRAM) 50 MG tablet, Take 1 tablet (50 mg total) by mouth  3 (three) times daily., Disp: 90 tablet, Rfl: 0 .  varenicline (CHANTIX STARTING MONTH PAK) 0.5 MG X 11 & 1 MG X 42 tablet, As directed in package, Disp: 53 tablet, Rfl: 0   Allergies  Allergen Reactions  . Penicillins Hives    DID THE REACTION INVOLVE: Swelling of the face/tongue/throat, SOB, or low BP? No Sudden or severe rash/hives, skin peeling, or the inside of the mouth or nose? No Did it require medical treatment? No When did it last happen?unkn If all above answers are "NO", may proceed with cephalosporin use.     ROS Review of Systems  Constitutional: Negative.   HENT: Negative.   Eyes: Negative.   Respiratory: Negative.   Cardiovascular: Negative.   Gastrointestinal: Negative.   Endocrine: Negative.   Genitourinary: Negative.   Musculoskeletal: Negative.   Skin: Negative.   Allergic/Immunologic: Negative.   Neurological: Negative.   Hematological: Negative.   Psychiatric/Behavioral: Negative.   All other systems reviewed and are negative.    OBJECTIVE:    Physical Exam Vitals reviewed.  Constitutional:      Appearance: Normal appearance.  HENT:     Mouth/Throat:     Mouth: Mucous membranes are moist.  Eyes:     Pupils: Pupils are equal, round, and reactive to light.     Comments: Eyes are clear   Neck:     Vascular: No carotid bruit.     Comments: No lymph nodes, trachea center, throat is clear, no bruit in neck Cardiovascular:     Rate and Rhythm: Normal rate and regular rhythm.     Pulses: Normal pulses.     Heart sounds: Normal heart sounds.  Pulmonary:     Effort: Pulmonary effort is normal.     Breath sounds: Normal breath sounds.  Abdominal:     General: Bowel sounds are normal.     Palpations: Abdomen is soft. There is no hepatomegaly, splenomegaly or mass.     Tenderness: There is no abdominal tenderness.     Hernia: No hernia is present.     Comments: No bruit in abdomen or groin  Musculoskeletal:        General: No tenderness.      Cervical back: Neck supple.     Right lower leg: No edema.     Left lower leg: No edema.  Skin:    Findings: No rash.  Neurological:     Mental Status: She is alert and oriented to person,  place, and time.     Motor: No weakness.  Psychiatric:        Mood and Affect: Mood and affect normal.        Behavior: Behavior normal.     BP (!) 120/64   Pulse 75   Ht 5\' 3"  (1.6 m)   Wt 104 lb 1.6 oz (47.2 kg)   BMI 18.44 kg/m  Wt Readings from Last 3 Encounters:  08/11/19 104 lb 1.6 oz (47.2 kg)  02/09/18 116 lb 12.8 oz (53 kg)  01/20/18 110 lb (49.9 kg)    Health Maintenance Due  Topic Date Due  . Hepatitis C Screening  Never done  . COVID-19 Vaccine (1) Never done  . HIV Screening  Never done  . TETANUS/TDAP  Never done  . PAP SMEAR-Modifier  Never done  . COLONOSCOPY  Never done    There are no preventive care reminders to display for this patient.  CBC Latest Ref Rng & Units 01/20/2018 06/09/2017 09/19/2016  WBC 4.0 - 10.5 K/uL 10.9(H) 10.3 9.2  Hemoglobin 12.0 - 15.0 g/dL 14.9 13.6 15.4  Hematocrit 36 - 46 % 44.9 40.6 44.7  Platelets 150 - 400 K/uL 353 356 471(H)   CMP Latest Ref Rng & Units 10/03/2018 01/20/2018 12/31/2017  Glucose 70 - 99 mg/dL - 87 -  BUN 6 - 20 mg/dL 7 10 -  Creatinine 0.44 - 1.00 mg/dL 0.70 0.67 0.70  Sodium 135 - 145 mmol/L - 138 -  Potassium 3.5 - 5.1 mmol/L - 3.4(L) -  Chloride 98 - 111 mmol/L - 100 -  CO2 22 - 32 mmol/L - 28 -  Calcium 8.9 - 10.3 mg/dL - 10.2 -  Total Protein 6.5 - 8.1 g/dL - - -  Total Bilirubin 0.3 - 1.2 mg/dL - - -  Alkaline Phos 38 - 126 U/L - - -  AST 15 - 41 U/L - - -  ALT 14 - 54 U/L - - -    Lab Results  Component Value Date   TSH 1.698 06/09/2017   Lab Results  Component Value Date   ALBUMIN 3.9 09/19/2016   ANIONGAP 10 01/20/2018   Lab Results  Component Value Date   CHOL 247 (H) 06/09/2017   HDL 44 06/09/2017   LDLCALC 179 (H) 06/09/2017   CHOLHDL 5.6 06/09/2017   Lab Results  Component Value Date    TRIG 119 06/09/2017   No results found for: HGBA1C    ASSESSMENT & PLAN:   Problem List Items Addressed This Visit      Cardiovascular and Mediastinum   Essential hypertension    - Today, the patient's blood pressure is well managed on Lotrel 5/10mg  1 capsule daily. - The patient will continue the current treatment regimen.  - I encouraged the patient to eat a low-sodium diet to help control blood pressure. - I encouraged the patient to live an active lifestyle and complete activities that increases heart rate to 85% target heart rate at least 5 times per week for one hour.          Raynaud's disease without gangrene    Pt has chronic raynauds disease of both hands. She is being seen by vascular specialist and rheumatologist. She has been again advised to quit smoking.         Respiratory   Simple chronic bronchitis ([place])    - The patient's COPD is stable With/Without: with treatment.  - I informed the patient of the importance of  getting routine flu and pneumonia vaccines.  - I instructed the patient to seek help if they experience more difficulty breathing than usual or if the pulse oximeter shows low oxygen for longer than 5 minutes.         Other   Annual physical exam    Pt was advised to have a colonoscopy and mammogram. Pt has yearly CT scan due to being a smoker      Personal history of tobacco use, presenting hazards to health - Primary    - I instructed the patient to stop smoking and provided them with smoking cessation materials.  - I informed the patient that smoking puts them at increased risk for cancer, COPD, hypertension, and more.  - Informed the patient to seek help if they begin to have trouble breathing, develop chest pain, start to cough up blood, feel faint, or pass out.           No orders of the defined types were placed in this encounter.    Follow-up: No follow-ups on file.  Recommends getting colonoscopy, Recommends labs  Dr.  Jane Canary [facility] [address] [place], [place] [address]   By signing my name below, I, Dawayne Cirri, attest that this documentation has been prepared under the direction and in the presence of Cletis Athens, MD. Electronically Signed: Cletis Athens, MD 08/11/19, 10:12 AM   I personally performed the services described in this documentation, which was SCRIBED in my presence. The recorded information has been reviewed and considered accurate. It has been edited as necessary during review. Cletis Athens, MD

## 2019-08-21 ENCOUNTER — Encounter: Payer: Self-pay | Admitting: Internal Medicine

## 2019-08-21 ENCOUNTER — Ambulatory Visit (INDEPENDENT_AMBULATORY_CARE_PROVIDER_SITE_OTHER): Payer: 59 | Admitting: Internal Medicine

## 2019-08-21 ENCOUNTER — Other Ambulatory Visit: Payer: Self-pay

## 2019-08-21 VITALS — BP 149/72 | HR 95 | Temp 96.1°F | Ht 63.0 in | Wt 100.0 lb

## 2019-08-21 DIAGNOSIS — J410 Simple chronic bronchitis: Secondary | ICD-10-CM

## 2019-08-21 DIAGNOSIS — I10 Essential (primary) hypertension: Secondary | ICD-10-CM | POA: Diagnosis not present

## 2019-08-21 DIAGNOSIS — I7300 Raynaud's syndrome without gangrene: Secondary | ICD-10-CM

## 2019-08-21 DIAGNOSIS — F17200 Nicotine dependence, unspecified, uncomplicated: Secondary | ICD-10-CM | POA: Insufficient documentation

## 2019-08-21 MED ORDER — AZITHROMYCIN 250 MG PO TABS: ORAL_TABLET | ORAL | 0 refills | Status: DC

## 2019-08-21 MED ORDER — TRAMADOL HCL 50 MG PO TABS: 50.0000 mg | ORAL_TABLET | Freq: Three times a day (TID) | ORAL | 0 refills | Status: DC

## 2019-08-21 NOTE — Assessment & Plan Note (Signed)
Pt has chronic Raynaud's disease, which is stable at the present time. Refer to vascular.

## 2019-08-21 NOTE — Assessment & Plan Note (Signed)
Pt likely has chronic bronchitis. She is a smoker, and feeling bad, so I'll start her on Zpack as directed. She was advised to have a COVID19 test as well as a RSV test completed.

## 2019-08-21 NOTE — Progress Notes (Signed)
Established Patient Office Visit  SUBJECTIVE:  Subjective  Patient ID: Tracey Walters, female    DOB: [birth year]  Age: 59 y.o. MRN: [id number]  CC:  Chief Complaint  Patient presents with   Sinusitis    Patient presents today with complaints of sore throat, cough, sinus drainage and congestion since Friday    HPI Tracey Walters is a 59 y.o. female presenting today for an evaluation for sinusitis.   She is having issues with her sinuses at the moment. She notes that her sinuses are dripping down the back of her throat causing her to cough. She is also having problem with bladder leakage when she coughs as well. She also feels like her ears are clogged up.   Past Medical History:  Diagnosis Date   Anxiety    Carpal tunnel syndrome    right wrist   Dyspnea    GERD (gastroesophageal reflux disease)    Hyperlipidemia    Hypertension 20 yrs   Raynaud's phenomenon    Rheumatoid arteritis ([place])    Ulcer     Past Surgical History:  Procedure Laterality Date   ABDOMINAL HYSTERECTOMY  1992   BREAST BIOPSY Right 2011   neg   BREAST BIOPSY Right 11/1996   Dense fibrosis,  benign epithelial hyperplasia.   CARPAL TUNNEL RELEASE Right 2012   COLONOSCOPY  2010   ENDOBRONCHIAL ULTRASOUND Right 01/25/2018   Procedure: ENDOBRONCHIAL ULTRASOUND;  Surgeon: Tyler Pita, MD;  Location: [facility] ORS;  Service: Cardiopulmonary;  Laterality: Right;   TONSILLECTOMY     TUBAL LIGATION     UPPER GI ENDOSCOPY      Family History  Problem Relation Age of Onset   Breast cancer Sister 22   Breast cancer Maternal Aunt     Social History   Socioeconomic History   Marital status: Divorced    Spouse name: Not on file   Number of children: Not on file   Years of education: Not on file   Highest education level: Not on file  Occupational History   Not on file  Tobacco Use   Smoking status: Current Every Day Smoker    Packs/day: 2.00    Years: 42.00     Pack years: 84.00    Types: Cigarettes   Smokeless tobacco: Never Used   Tobacco comment: currently 2 packs/day  Vaping Use   Vaping Use: Never used  Substance and Sexual Activity   Alcohol use: No   Drug use: No   Sexual activity: Not on file  Other Topics Concern   Not on file  Social History Narrative   Not on file   Social Determinants of Health   Financial Resource Strain:    Difficulty of Paying Living Expenses:   Food Insecurity:    Worried About Charity fundraiser in the Last Year:    Arboriculturist in the Last Year:   Transportation Needs:    Film/video editor (Medical):    Lack of Transportation (Non-Medical):   Physical Activity:    Days of Exercise per Week:    Minutes of Exercise per Session:   Stress:    Feeling of Stress :   Social Connections:    Frequency of Communication with Friends and Family:    Frequency of Social Gatherings with Friends and Family:    Attends Religious Services:    Active Member of Clubs or Organizations:    Attends Archivist Meetings:    Marital  Status:   Intimate Partner Violence:    Fear of Current or Ex-Partner:    Emotionally Abused:    Physically Abused:    Sexually Abused:      Current Outpatient Medications:    ALPRAZolam (XANAX) 0.25 MG tablet, Take 0.25 mg by mouth at bedtime as needed for anxiety or sleep., Disp: , Rfl:    amLODipine-benazepril (LOTREL) 5-10 MG capsule, Take 1 capsule by mouth daily., Disp: 90 capsule, Rfl: 3   azithromycin (ZITHROMAX) 250 MG tablet, 2 tab po daily  For 3 days, Disp: 6 tablet, Rfl: 0   Cyanocobalamin (VITAMIN B 12 PO), Take 1,000 mg by mouth daily., Disp: , Rfl:    gabapentin (NEURONTIN) 300 MG capsule, Take 300 mg by mouth daily as needed (pain). , Disp: , Rfl: 3   pantoprazole (PROTONIX) 40 MG tablet, Take 40 mg by mouth daily., Disp: , Rfl:    traMADol (ULTRAM) 50 MG tablet, Take 1 tablet (50 mg total) by mouth 3 (three) times  daily., Disp: 90 tablet, Rfl: 0   varenicline (CHANTIX STARTING MONTH PAK) 0.5 MG X 11 & 1 MG X 42 tablet, As directed in package, Disp: 53 tablet, Rfl: 0   Allergies  Allergen Reactions   Penicillins Hives    DID THE REACTION INVOLVE: Swelling of the face/tongue/throat, SOB, or low BP? No Sudden or severe rash/hives, skin peeling, or the inside of the mouth or nose? No Did it require medical treatment? No When did it last happen?unkn If all above answers are NO, may proceed with cephalosporin use.     ROS Review of Systems  Constitutional: Negative.   HENT: Positive for congestion, postnasal drip, rhinorrhea and sore throat.   Eyes: Negative.   Respiratory: Positive for cough.   Cardiovascular: Positive for palpitations.  Gastrointestinal: Negative.   Endocrine: Negative.   Genitourinary: Negative.   Musculoskeletal: Negative.   Skin: Negative.   Allergic/Immunologic: Negative.   Neurological: Negative.   Hematological: Negative.   Psychiatric/Behavioral: Negative.   All other systems reviewed and are negative.    OBJECTIVE:    Physical Exam Vitals reviewed.  Constitutional:      Appearance: Normal appearance.  HENT:     Right Ear: Drainage present.     Left Ear: Drainage present.     Mouth/Throat:     Mouth: Mucous membranes are moist.     Pharynx: Posterior oropharyngeal erythema present.  Eyes:     Pupils: Pupils are equal, round, and reactive to light.  Neck:     Vascular: No carotid bruit.  Cardiovascular:     Rate and Rhythm: Normal rate and regular rhythm.     Pulses: Normal pulses.     Heart sounds: Normal heart sounds.  Pulmonary:     Effort: Pulmonary effort is normal.     Breath sounds: Decreased breath sounds and wheezing present.  Abdominal:     General: Bowel sounds are normal.     Palpations: Abdomen is soft. There is no hepatomegaly, splenomegaly or mass.     Tenderness: There is no abdominal tenderness.     Hernia: No hernia is  present.  Musculoskeletal:        General: No tenderness.     Cervical back: Neck supple.     Right lower leg: No edema.     Left lower leg: No edema.  Skin:    Findings: No rash.  Neurological:     Mental Status: She is alert and oriented to person, place,  and time.     Motor: No weakness.  Psychiatric:        Mood and Affect: Mood and affect normal.        Behavior: Behavior normal.     BP (!) 149/72    Pulse 95    Temp (!) 96.1 F (35.6 C) (Temporal)    Ht 5\' 3"  (1.6 m)    Wt 100 lb (45.4 kg)    BMI 17.71 kg/m  Wt Readings from Last 3 Encounters:  08/21/19 100 lb (45.4 kg)  08/11/19 104 lb 1.6 oz (47.2 kg)  02/09/18 116 lb 12.8 oz (53 kg)    Health Maintenance Due  Topic Date Due   Hepatitis C Screening  Never done   COVID-19 Vaccine (1) Never done   HIV Screening  Never done   TETANUS/TDAP  Never done   PAP SMEAR-Modifier  Never done   COLONOSCOPY  Never done   INFLUENZA VACCINE  08/13/2019    There are no preventive care reminders to display for this patient.  CBC Latest Ref Rng & Units 01/20/2018 06/09/2017 09/19/2016  WBC 4.0 - 10.5 K/uL 10.9(H) 10.3 9.2  Hemoglobin 12.0 - 15.0 g/dL 14.9 13.6 15.4  Hematocrit 36 - 46 % 44.9 40.6 44.7  Platelets 150 - 400 K/uL 353 356 471(H)   CMP Latest Ref Rng & Units 10/03/2018 01/20/2018 12/31/2017  Glucose 70 - 99 mg/dL - 87 -  BUN 6 - 20 mg/dL 7 10 -  Creatinine 0.44 - 1.00 mg/dL 0.70 0.67 0.70  Sodium 135 - 145 mmol/L - 138 -  Potassium 3.5 - 5.1 mmol/L - 3.4(L) -  Chloride 98 - 111 mmol/L - 100 -  CO2 22 - 32 mmol/L - 28 -  Calcium 8.9 - 10.3 mg/dL - 10.2 -  Total Protein 6.5 - 8.1 g/dL - - -  Total Bilirubin 0.3 - 1.2 mg/dL - - -  Alkaline Phos 38 - 126 U/L - - -  AST 15 - 41 U/L - - -  ALT 14 - 54 U/L - - -    Lab Results  Component Value Date   TSH 1.698 06/09/2017   Lab Results  Component Value Date   ALBUMIN 3.9 09/19/2016   ANIONGAP 10 01/20/2018   Lab Results  Component Value Date   CHOL 247  (H) 06/09/2017   HDL 44 06/09/2017   LDLCALC 179 (H) 06/09/2017   CHOLHDL 5.6 06/09/2017   Lab Results  Component Value Date   TRIG 119 06/09/2017   No results found for: HGBA1C    ASSESSMENT & PLAN:   Problem List Items Addressed This Visit      Cardiovascular and Mediastinum   Essential hypertension    Bp  Is elevated  Due to  bronchitis      Raynaud's disease without gangrene    Pt has chronic Raynaud's disease, which is stable at the present time. Refer to vascular.         Respiratory   Simple chronic bronchitis ([facility]) - Primary    Pt likely has chronic bronchitis. She is a smoker, and feeling bad, so I'll start her on Zpack as directed. She was advised to have a COVID19 test as well as a RSV test completed.       Relevant Medications   traMADol (ULTRAM) 50 MG tablet   azithromycin (ZITHROMAX) 250 MG tablet     Other   Smoker    - I instructed the patient to stop smoking  and provided them with smoking cessation materials.  - I informed the patient that smoking puts them at increased risk for cancer, COPD, hypertension, and more.  - Informed the patient to seek help if they begin to have trouble breathing, develop chest pain, start to cough up blood, feel faint, or pass out.          Meds ordered this encounter  Medications   traMADol (ULTRAM) 50 MG tablet    Sig: Take 1 tablet (50 mg total) by mouth 3 (three) times daily.    Dispense:  90 tablet    Refill:  0   azithromycin (ZITHROMAX) 250 MG tablet    Sig: 2 tab po daily  For 3 days    Dispense:  6 tablet    Refill:  0    Follow-up: No follow-ups on file.    Dr. Jane Canary [facility] [address], [place], [place] [address]   By signing my name below, I, [employer], attest that this documentation has been prepared under the direction and in the presence of Cletis Athens, MD. Electronically Signed: Cletis Athens, MD 08/21/19, 4:15 PM   I personally performed the services  described in this documentation, which was SCRIBED in my presence. The recorded information has been reviewed and considered accurate. It has been edited as necessary during review. Cletis Athens, MD

## 2019-08-21 NOTE — Assessment & Plan Note (Signed)
Bp  Is elevated  Due to  bronchitis

## 2019-08-21 NOTE — Assessment & Plan Note (Signed)
-   I instructed the patient to stop smoking and provided them with smoking cessation materials.  - I informed the patient that smoking puts them at increased risk for cancer, COPD, hypertension, and more.  - Informed the patient to seek help if they begin to have trouble breathing, develop chest pain, start to cough up blood, feel faint, or pass out.  

## 2019-08-22 ENCOUNTER — Other Ambulatory Visit: Payer: Self-pay

## 2019-10-03 DIAGNOSIS — J410 Simple chronic bronchitis: Secondary | ICD-10-CM

## 2019-10-03 MED ORDER — TRAMADOL HCL 50 MG PO TABS: 50.0000 mg | ORAL_TABLET | Freq: Three times a day (TID) | ORAL | 0 refills | Status: DC

## 2019-10-19 ENCOUNTER — Other Ambulatory Visit: Payer: Self-pay | Admitting: General Surgery

## 2019-10-19 DIAGNOSIS — Z1239 Encounter for other screening for malignant neoplasm of breast: Secondary | ICD-10-CM

## 2019-10-19 NOTE — Addendum Note (Signed)
Addended byBary Castilla, Derrich Gaby on: 10/19/2019 01:11 PM   Modules accepted: Orders

## 2019-11-20 ENCOUNTER — Other Ambulatory Visit: Payer: Self-pay | Admitting: Internal Medicine

## 2019-11-20 ENCOUNTER — Other Ambulatory Visit: Payer: Self-pay

## 2019-11-20 ENCOUNTER — Ambulatory Visit (INDEPENDENT_AMBULATORY_CARE_PROVIDER_SITE_OTHER): Payer: 59 | Admitting: Internal Medicine

## 2019-11-20 ENCOUNTER — Encounter: Payer: Self-pay | Admitting: Internal Medicine

## 2019-11-20 VITALS — BP 123/76 | HR 89 | Ht 62.0 in | Wt 103.0 lb

## 2019-11-20 DIAGNOSIS — I7300 Raynaud's syndrome without gangrene: Secondary | ICD-10-CM

## 2019-11-20 DIAGNOSIS — J410 Simple chronic bronchitis: Secondary | ICD-10-CM

## 2019-11-20 DIAGNOSIS — I10 Essential (primary) hypertension: Secondary | ICD-10-CM

## 2019-11-20 DIAGNOSIS — F17200 Nicotine dependence, unspecified, uncomplicated: Secondary | ICD-10-CM

## 2019-11-20 DIAGNOSIS — F5101 Primary insomnia: Secondary | ICD-10-CM

## 2019-11-20 MED ORDER — ALPRAZOLAM 0.25 MG PO TABS: 0.2500 mg | ORAL_TABLET | Freq: Every evening | ORAL | 1 refills | Status: DC | PRN

## 2019-11-20 MED ORDER — TRAMADOL HCL 50 MG PO TABS: 50.0000 mg | ORAL_TABLET | Freq: Three times a day (TID) | ORAL | 0 refills | Status: DC

## 2019-11-20 NOTE — Progress Notes (Signed)
Established Patient Office Visit  Subjective:  Patient ID: Tracey Walters, female    DOB: 1960-10-29  Age: 59 y.o. MRN: [id number]  CC:  Chief Complaint  Patient presents with  . Back Pain    patient needs tramadol refill     HPI  [name] presents for check up.  Patient is known to have elevated cholesterol her TSH is normal.  She is also known to have Raynaud's phenomenon.  Unfortunately she continues to smoke and not motivated to quit smoking.  She has received her Covid shot.  Past Medical History:  Diagnosis Date  . Anxiety   . Carpal tunnel syndrome    right wrist  . Dyspnea   . GERD (gastroesophageal reflux disease)   . Hyperlipidemia   . Hypertension 20 yrs  . Raynaud's phenomenon   . Rheumatoid arteritis ([place])   . Ulcer     Past Surgical History:  Procedure Laterality Date  . ABDOMINAL HYSTERECTOMY  1992  . BREAST BIOPSY Right 2011   neg  . BREAST BIOPSY Right 11/1996   Dense fibrosis,  benign epithelial hyperplasia.  Marland Kitchen CARPAL TUNNEL RELEASE Right 2012  . COLONOSCOPY  2010  . ENDOBRONCHIAL ULTRASOUND Right 01/25/2018   Procedure: ENDOBRONCHIAL ULTRASOUND;  Surgeon: Tyler Pita, MD;  Location: [facility] ORS;  Service: Cardiopulmonary;  Laterality: Right;  . TONSILLECTOMY    . TUBAL LIGATION    . UPPER GI ENDOSCOPY      Family History  Problem Relation Age of Onset  . Breast cancer Sister 89  . Breast cancer Maternal Aunt     Social History   Socioeconomic History  . Marital status: Divorced    Spouse name: Not on file  . Number of children: Not on file  . Years of education: Not on file  . Highest education level: Not on file  Occupational History  . Not on file  Tobacco Use  . Smoking status: Current Every Day Smoker    Packs/day: 2.00    Years: 42.00    Pack years: 84.00    Types: Cigarettes  . Smokeless tobacco: Never Used  . Tobacco comment: currently 2 packs/day  Vaping Use  . Vaping Use: Never used  Substance and  Sexual Activity  . Alcohol use: No  . Drug use: No  . Sexual activity: Not on file  Other Topics Concern  . Not on file  Social History Narrative  . Not on file   Social Determinants of Health   Financial Resource Strain:   . Difficulty of Paying Living Expenses: Not on file  Food Insecurity:   . Worried About Charity fundraiser in the Last Year: Not on file  . Ran Out of Food in the Last Year: Not on file  Transportation Needs:   . Lack of Transportation (Medical): Not on file  . Lack of Transportation (Non-Medical): Not on file  Physical Activity:   . Days of Exercise per Week: Not on file  . Minutes of Exercise per Session: Not on file  Stress:   . Feeling of Stress : Not on file  Social Connections:   . Frequency of Communication with Friends and Family: Not on file  . Frequency of Social Gatherings with Friends and Family: Not on file  . Attends Religious Services: Not on file  . Active Member of Clubs or Organizations: Not on file  . Attends Archivist Meetings: Not on file  . Marital Status: Not on file  Intimate Partner Violence:   . Fear of Current or Ex-Partner: Not on file  . Emotionally Abused: Not on file  . Physically Abused: Not on file  . Sexually Abused: Not on file     Current Outpatient Medications:  .  ALPRAZolam (XANAX) 0.25 MG tablet, Take 1 tablet (0.25 mg total) by mouth at bedtime as needed for anxiety or sleep., Disp: 30 tablet, Rfl: 1 .  amLODipine-benazepril (LOTREL) 5-10 MG capsule, Take 1 capsule by mouth daily., Disp: 90 capsule, Rfl: 3 .  Cyanocobalamin (VITAMIN B 12 PO), Take 1,000 mg by mouth daily., Disp: , Rfl:  .  gabapentin (NEURONTIN) 300 MG capsule, Take 300 mg by mouth daily as needed (pain). , Disp: , Rfl: 3 .  pantoprazole (PROTONIX) 40 MG tablet, Take 40 mg by mouth daily., Disp: , Rfl:  .  traMADol (ULTRAM) 50 MG tablet, Take 1 tablet (50 mg total) by mouth 3 (three) times daily., Disp: 90 tablet, Rfl: 0 .   varenicline (CHANTIX STARTING MONTH PAK) 0.5 MG X 11 & 1 MG X 42 tablet, As directed in package, Disp: 53 tablet, Rfl: 0 .  azithromycin (ZITHROMAX) 250 MG tablet, 2 tab po daily  For 3 days, Disp: 6 tablet, Rfl: 0   Allergies  Allergen Reactions  . Penicillins Hives    DID THE REACTION INVOLVE: Swelling of the face/tongue/throat, SOB, or low BP? No Sudden or severe rash/hives, skin peeling, or the inside of the mouth or nose? No Did it require medical treatment? No When did it last happen?unkn If all above answers are "NO", may proceed with cephalosporin use.     ROS Review of Systems  Constitutional: Negative.   HENT: Negative.   Eyes: Negative.   Respiratory: Negative.   Cardiovascular: Negative.   Gastrointestinal: Negative.   Endocrine: Negative.   Genitourinary: Negative.   Musculoskeletal: Negative.   Skin: Negative.   Allergic/Immunologic: Negative.   Neurological: Negative.   Hematological: Negative.   Psychiatric/Behavioral: Negative.   All other systems reviewed and are negative.     Objective:    Physical Exam Vitals reviewed.  Constitutional:      Appearance: Normal appearance.  HENT:     Mouth/Throat:     Mouth: Mucous membranes are moist.  Eyes:     Pupils: Pupils are equal, round, and reactive to light.  Neck:     Vascular: No carotid bruit.  Cardiovascular:     Rate and Rhythm: Normal rate and regular rhythm.     Pulses: Normal pulses.     Heart sounds: Normal heart sounds.  Pulmonary:     Effort: Pulmonary effort is normal.     Breath sounds: Normal breath sounds.  Abdominal:     General: Bowel sounds are normal.     Palpations: Abdomen is soft. There is no hepatomegaly, splenomegaly or mass.     Tenderness: There is no abdominal tenderness.     Hernia: No hernia is present.  Musculoskeletal:        General: No tenderness.     Cervical back: Neck supple.     Right lower leg: No edema.     Left lower leg: No edema.  Skin:     Findings: No rash.  Neurological:     Mental Status: She is alert and oriented to person, place, and time.     Motor: No weakness.  Psychiatric:        Mood and Affect: Mood and affect normal.  Behavior: Behavior normal.     BP 123/76   Pulse 89   Ht 5\' 2"  (1.575 m)   Wt 103 lb (46.7 kg)   BMI 18.84 kg/m  Wt Readings from Last 3 Encounters:  11/20/19 103 lb (46.7 kg)  08/21/19 100 lb (45.4 kg)  08/11/19 104 lb 1.6 oz (47.2 kg)     Health Maintenance Due  Topic Date Due  . Hepatitis C Screening  Never done  . COVID-19 Vaccine (1) Never done  . HIV Screening  Never done  . TETANUS/TDAP  Never done  . PAP SMEAR-Modifier  Never done  . COLONOSCOPY  Never done    There are no preventive care reminders to display for this patient.  Lab Results  Component Value Date   TSH 1.698 06/09/2017   Lab Results  Component Value Date   WBC 10.9 (H) 01/20/2018   HGB 14.9 01/20/2018   HCT 44.9 01/20/2018   MCV 95.7 01/20/2018   PLT 353 01/20/2018   Lab Results  Component Value Date   NA 138 01/20/2018   K 3.4 (L) 01/20/2018   CO2 28 01/20/2018   GLUCOSE 87 01/20/2018   BUN 7 10/03/2018   CREATININE 0.70 10/03/2018   BILITOT 0.5 09/19/2016   ALKPHOS 89 09/19/2016   AST 17 09/19/2016   ALT 6 (L) 09/19/2016   PROT 7.9 09/19/2016   ALBUMIN 3.9 09/19/2016   CALCIUM 10.2 01/20/2018   ANIONGAP 10 01/20/2018   Lab Results  Component Value Date   CHOL 247 (H) 06/09/2017   Lab Results  Component Value Date   HDL 44 06/09/2017   Lab Results  Component Value Date   LDLCALC 179 (H) 06/09/2017   Lab Results  Component Value Date   TRIG 119 06/09/2017   Lab Results  Component Value Date   CHOLHDL 5.6 06/09/2017   No results found for: HGBA1C    Assessment & Plan:   Problem List Items Addressed This Visit      Cardiovascular and Mediastinum   Essential hypertension    Blood pressure is under control on amlodipine.      Raynaud's disease without  gangrene    She is taking calcium channel blocker for Raynaud's.  She was advised to quit smoking.        Respiratory   Simple chronic bronchitis ([place]) - Primary    On today's examination patient chest is clear no wheezing or rales are noted.  She was advised to quit smoking.      Relevant Medications   traMADol (ULTRAM) 50 MG tablet     Other   Smoker    - I instructed the patient to stop smoking and provided them with smoking cessation materials.  - I informed the patient that smoking puts them at increased risk for cancer, COPD, hypertension, and more.  - Informed the patient to seek help if they begin to have trouble breathing, develop chest pain, start to cough up blood, feel faint, or pass out.        Primary insomnia    Due to smoking.  Avoid coffee, and maintain sleep hygiene      Relevant Medications   ALPRAZolam (XANAX) 0.25 MG tablet      Meds ordered this encounter  Medications  . traMADol (ULTRAM) 50 MG tablet    Sig: Take 1 tablet (50 mg total) by mouth 3 (three) times daily.    Dispense:  90 tablet    Refill:  0  .  ALPRAZolam (XANAX) 0.25 MG tablet    Sig: Take 1 tablet (0.25 mg total) by mouth at bedtime as needed for anxiety or sleep.    Dispense:  30 tablet    Refill:  1    Follow-up: No follow-ups on file.    Cletis Athens, MD

## 2019-11-26 ENCOUNTER — Encounter: Payer: Self-pay | Admitting: Internal Medicine

## 2019-11-26 DIAGNOSIS — F5101 Primary insomnia: Secondary | ICD-10-CM | POA: Insufficient documentation

## 2019-11-26 NOTE — Assessment & Plan Note (Signed)
Due to smoking.  Avoid coffee, and maintain sleep hygiene

## 2019-11-26 NOTE — Assessment & Plan Note (Signed)
Blood pressure is under control on amlodipine.

## 2019-11-26 NOTE — Assessment & Plan Note (Signed)
She is taking calcium channel blocker for Raynaud's.  She was advised to quit smoking.

## 2019-11-26 NOTE — Assessment & Plan Note (Signed)
On today's examination patient chest is clear no wheezing or rales are noted.  She was advised to quit smoking.

## 2019-11-26 NOTE — Assessment & Plan Note (Signed)
-   I instructed the patient to stop smoking and provided them with smoking cessation materials.  - I informed the patient that smoking puts them at increased risk for cancer, COPD, hypertension, and more.  - Informed the patient to seek help if they begin to have trouble breathing, develop chest pain, start to cough up blood, feel faint, or pass out.  

## 2019-11-30 ENCOUNTER — Ambulatory Visit
Admission: RE | Admit: 2019-11-30 | Discharge: 2019-11-30 | Disposition: A | Discharge status: Home / Self Care | Payer: 59 | Source: Ambulatory Visit | Attending: General Surgery | Admitting: General Surgery

## 2019-11-30 ENCOUNTER — Other Ambulatory Visit: Payer: Self-pay

## 2019-11-30 DIAGNOSIS — Z1239 Encounter for other screening for malignant neoplasm of breast: Secondary | ICD-10-CM

## 2019-11-30 DIAGNOSIS — Z1231 Encounter for screening mammogram for malignant neoplasm of breast: Secondary | ICD-10-CM | POA: Diagnosis not present

## 2019-12-12 DIAGNOSIS — Z1239 Encounter for other screening for malignant neoplasm of breast: Secondary | ICD-10-CM | POA: Diagnosis not present

## 2019-12-12 DIAGNOSIS — Z86010 Personal history of colonic polyps: Secondary | ICD-10-CM | POA: Diagnosis not present

## 2019-12-28 ENCOUNTER — Encounter: Payer: Self-pay | Admitting: Internal Medicine

## 2019-12-28 ENCOUNTER — Other Ambulatory Visit: Payer: Self-pay | Admitting: Family Medicine

## 2019-12-28 MED ORDER — TRAMADOL HCL 50 MG PO TABS: 50.0000 mg | ORAL_TABLET | Freq: Three times a day (TID) | ORAL | 0 refills | Status: DC

## 2019-12-28 MED ORDER — PANTOPRAZOLE SODIUM 40 MG PO TBEC: 40.0000 mg | DELAYED_RELEASE_TABLET | Freq: Every day | ORAL | 1 refills | Status: DC

## 2019-12-28 NOTE — Addendum Note (Signed)
Addended by: Beckie Salts on: 12/28/2019 05:54 PM   Modules accepted: Orders

## 2020-02-12 ENCOUNTER — Ambulatory Visit: Payer: 59 | Admitting: Internal Medicine

## 2020-02-12 ENCOUNTER — Other Ambulatory Visit: Payer: Self-pay | Admitting: Internal Medicine

## 2020-02-12 ENCOUNTER — Other Ambulatory Visit: Payer: Self-pay

## 2020-02-12 ENCOUNTER — Encounter: Payer: Self-pay | Admitting: Internal Medicine

## 2020-02-12 VITALS — BP 141/77 | HR 84 | Ht 62.0 in | Wt 107.2 lb

## 2020-02-12 DIAGNOSIS — I10 Essential (primary) hypertension: Secondary | ICD-10-CM | POA: Diagnosis not present

## 2020-02-12 DIAGNOSIS — I7300 Raynaud's syndrome without gangrene: Secondary | ICD-10-CM

## 2020-02-12 DIAGNOSIS — F17200 Nicotine dependence, unspecified, uncomplicated: Secondary | ICD-10-CM

## 2020-02-12 DIAGNOSIS — J410 Simple chronic bronchitis: Secondary | ICD-10-CM

## 2020-02-12 DIAGNOSIS — M791 Myalgia, unspecified site: Secondary | ICD-10-CM | POA: Diagnosis not present

## 2020-02-12 MED ORDER — TRAMADOL HCL 50 MG PO TABS: 50.0000 mg | ORAL_TABLET | Freq: Three times a day (TID) | ORAL | 0 refills | Status: DC

## 2020-02-12 MED ORDER — PANTOPRAZOLE SODIUM 40 MG PO TBEC: 40.0000 mg | DELAYED_RELEASE_TABLET | Freq: Every day | ORAL | 6 refills | Status: DC

## 2020-02-12 NOTE — Progress Notes (Signed)
Established Patient Office Visit  Subjective:  Patient ID: Tracey Walters, female    DOB: October 25, 1960  Age: 60 y.o. MRN: [id number]  CC:  Chief Complaint  Patient presents with  . Back Pain    Patient here for refill of tramadol     HPI  Yoshino Broccoli presents for myalgia, mechanism, pain in the back and mostly, patient is known to have raynauds she is also known to have anxiety neurosis carpectomy syndrome shortness of breath on exertion she smokes regularly I have advised her to quit smoking completely.  Reflux is under control.  Past Surgical History:  Procedure Laterality Date  . ABDOMINAL HYSTERECTOMY  1992  . BREAST BIOPSY Right 2011   neg  . BREAST BIOPSY Right 11/1996   Dense fibrosis,  benign epithelial hyperplasia.  Marland Kitchen CARPAL TUNNEL RELEASE Right 2012  . COLONOSCOPY  2010  . ENDOBRONCHIAL ULTRASOUND Right 01/25/2018   Procedure: ENDOBRONCHIAL ULTRASOUND;  Surgeon: Tyler Pita, MD;  Location: [facility] ORS;  Service: Cardiopulmonary;  Laterality: Right;  . TONSILLECTOMY    . TUBAL LIGATION    . UPPER GI ENDOSCOPY      Family History  Problem Relation Age of Onset  . Breast cancer Sister 32  . Breast cancer Maternal Aunt     Social History   Socioeconomic History  . Marital status: Divorced    Spouse name: Not on file  . Number of children: Not on file  . Years of education: Not on file  . Highest education level: Not on file  Occupational History  . Not on file  Tobacco Use  . Smoking status: Current Every Day Smoker    Packs/day: 2.00    Years: 42.00    Pack years: 84.00    Types: Cigarettes  . Smokeless tobacco: Never Used  . Tobacco comment: currently 2 packs/day  Vaping Use  . Vaping Use: Never used  Substance and Sexual Activity  . Alcohol use: No  . Drug use: No  . Sexual activity: Not on file  Other Topics Concern  . Not on file  Social History Narrative  . Not on file   Social Determinants of Health   Financial Resource  Strain: Not on file  Food Insecurity: Not on file  Transportation Needs: Not on file  Physical Activity: Not on file  Stress: Not on file  Social Connections: Not on file  Intimate Partner Violence: Not on file     Current Outpatient Medications:  .  ALPRAZolam (XANAX) 0.25 MG tablet, Take 1 tablet (0.25 mg total) by mouth at bedtime as needed for anxiety or sleep., Disp: 30 tablet, Rfl: 1 .  amLODipine-benazepril (LOTREL) 5-10 MG capsule, Take 1 capsule by mouth daily., Disp: 90 capsule, Rfl: 3 .  Cyanocobalamin (VITAMIN B 12 PO), Take 1,000 mg by mouth daily., Disp: , Rfl:  .  gabapentin (NEURONTIN) 300 MG capsule, Take 300 mg by mouth daily as needed (pain). , Disp: , Rfl: 3 .  varenicline (CHANTIX STARTING MONTH PAK) 0.5 MG X 11 & 1 MG X 42 tablet, As directed in package, Disp: 53 tablet, Rfl: 0 .  azithromycin (ZITHROMAX) 250 MG tablet, 2 tab po daily  For 3 days, Disp: 6 tablet, Rfl: 0 .  pantoprazole (PROTONIX) 40 MG tablet, Take 1 tablet (40 mg total) by mouth daily., Disp: 30 tablet, Rfl: 6 .  traMADol (ULTRAM) 50 MG tablet, Take 1 tablet (50 mg total) by mouth 3 (three) times daily., Disp: 90  tablet, Rfl: 0   Allergies  Allergen Reactions  . Penicillins Hives    DID THE REACTION INVOLVE: Swelling of the face/tongue/throat, SOB, or low BP? No Sudden or severe rash/hives, skin peeling, or the inside of the mouth or nose? No Did it require medical treatment? No When did it last happen?unkn If all above answers are "NO", may proceed with cephalosporin use.     ROS Review of Systems  Constitutional: Positive for fatigue. Negative for appetite change.  HENT: Negative.   Eyes: Negative.   Respiratory: Positive for shortness of breath and wheezing.   Cardiovascular: Negative.  Negative for chest pain.  Gastrointestinal: Negative.  Negative for abdominal distention.  Endocrine: Negative.   Genitourinary: Negative.  Negative for dysuria.  Musculoskeletal: Negative.    Skin: Negative.   Allergic/Immunologic: Negative.   Neurological: Negative.   Hematological: Negative.   Psychiatric/Behavioral: Negative.  Negative for behavioral problems.  All other systems reviewed and are negative.     Objective:    Physical Exam Vitals reviewed.  Constitutional:      Appearance: Normal appearance.  HENT:     Mouth/Throat:     Mouth: Mucous membranes are moist.  Eyes:     Pupils: Pupils are equal, round, and reactive to light.  Neck:     Vascular: No carotid bruit.  Cardiovascular:     Rate and Rhythm: Normal rate and regular rhythm.     Pulses: Normal pulses.     Heart sounds: Normal heart sounds.  Pulmonary:     Effort: Pulmonary effort is normal.     Breath sounds: Normal breath sounds. No stridor. No rhonchi.  Chest:     Chest wall: No tenderness.  Abdominal:     General: Bowel sounds are normal.     Palpations: Abdomen is soft. There is no hepatomegaly, splenomegaly or mass.     Tenderness: There is no abdominal tenderness.     Hernia: No hernia is present.  Musculoskeletal:        General: No swelling, deformity or signs of injury.     Cervical back: Neck supple.     Right lower leg: No edema.     Left lower leg: No edema.  Skin:    Findings: No rash.  Neurological:     Mental Status: She is alert and oriented to person, place, and time.     Motor: No weakness.  Psychiatric:        Mood and Affect: Mood and affect normal.        Behavior: Behavior normal.     BP (!) 141/77   Pulse 84   Ht 5\' 2"  (1.575 m)   Wt 107 lb 3.2 oz (48.6 kg)   BMI 19.61 kg/m  Wt Readings from Last 3 Encounters:  02/12/20 107 lb 3.2 oz (48.6 kg)  11/20/19 103 lb (46.7 kg)  08/21/19 100 lb (45.4 kg)     Health Maintenance Due  Topic Date Due  . Hepatitis C Screening  Never done  . COVID-19 Vaccine (1) Never done  . HIV Screening  Never done  . TETANUS/TDAP  Never done  . PAP SMEAR-Modifier  Never done  . COLONOSCOPY (Pts 45-76yrs Insurance  coverage will need to be confirmed)  Never done    There are no preventive care reminders to display for this patient.  Lab Results  Component Value Date   TSH 1.698 06/09/2017   Lab Results  Component Value Date   WBC 10.9 (H) 01/20/2018  HGB 14.9 01/20/2018   HCT 44.9 01/20/2018   MCV 95.7 01/20/2018   PLT 353 01/20/2018   Lab Results  Component Value Date   NA 138 01/20/2018   K 3.4 (L) 01/20/2018   CO2 28 01/20/2018   GLUCOSE 87 01/20/2018   BUN 7 10/03/2018   CREATININE 0.70 10/03/2018   BILITOT 0.5 09/19/2016   ALKPHOS 89 09/19/2016   AST 17 09/19/2016   ALT 6 (L) 09/19/2016   PROT 7.9 09/19/2016   ALBUMIN 3.9 09/19/2016   CALCIUM 10.2 01/20/2018   ANIONGAP 10 01/20/2018   Lab Results  Component Value Date   CHOL 247 (H) 06/09/2017   Lab Results  Component Value Date   HDL 44 06/09/2017   Lab Results  Component Value Date   LDLCALC 179 (H) 06/09/2017   Lab Results  Component Value Date   TRIG 119 06/09/2017   Lab Results  Component Value Date   CHOLHDL 5.6 06/09/2017   No results found for: HGBA1C    Assessment & Plan:   Problem List Items Addressed This Visit      Cardiovascular and Mediastinum   Essential hypertension     - I encouraged the patient to eat a low-sodium diet to help control blood pressure. - I encouraged the patient to live an active lifestyle and complete activities that increases heart rate to 85% target heart rate at least 5 times per week for one hour.          Raynaud's disease without gangrene    Advanced heart disease is stable at the present time.        Respiratory   Simple chronic bronchitis ([place])    Patient has been advised to quit smoking completely        Other   Smoker    - I instructed the patient to stop smoking and provided them with smoking cessation materials.  - I informed the patient that smoking puts them at increased risk for cancer, COPD, hypertension, and more.  - Informed the  patient to seek help if they begin to have trouble breathing, develop chest pain, start to cough up blood, feel faint, or pass out.        Myalgia - Primary    r tramadol was refilled for the patient for myalgia      Relevant Medications   traMADol (ULTRAM) 50 MG tablet      Meds ordered this encounter  Medications  . pantoprazole (PROTONIX) 40 MG tablet    Sig: Take 1 tablet (40 mg total) by mouth daily.    Dispense:  30 tablet    Refill:  6  . traMADol (ULTRAM) 50 MG tablet    Sig: Take 1 tablet (50 mg total) by mouth 3 (three) times daily.    Dispense:  90 tablet    Refill:  0    Follow-up: No follow-ups on file.    Cletis Athens, MD

## 2020-02-12 NOTE — Assessment & Plan Note (Signed)
.  -   I encouraged the patient to eat a low-sodium diet to help control blood pressure. - I encouraged the patient to live an active lifestyle and complete activities that increases heart rate to 85% target heart rate at least 5 times per week for one hour.     

## 2020-02-12 NOTE — Assessment & Plan Note (Signed)
Patient has been advised to quit smoking completely 

## 2020-02-12 NOTE — Assessment & Plan Note (Signed)
r tramadol was refilled for the patient for myalgia

## 2020-02-12 NOTE — Assessment & Plan Note (Signed)
-   I instructed the patient to stop smoking and provided them with smoking cessation materials.  - I informed the patient that smoking puts them at increased risk for cancer, COPD, hypertension, and more.  - Informed the patient to seek help if they begin to have trouble breathing, develop chest pain, start to cough up blood, feel faint, or pass out.  

## 2020-02-12 NOTE — Assessment & Plan Note (Signed)
Advanced heart disease is stable at the present time.

## 2020-03-13 IMAGING — CT CT CHEST W/ CM
2 of 4 series · 15 of 36 positions shown, 18 images · IV contrast (iopamidol)
Comparison: 12/29/2017 PET-CT.  12/27/2017 CT chest.

CLINICAL DATA: 57 y/o  F; follow-up of pulmonary nodule.

EXAM:
CT CHEST WITH CONTRAST
TECHNIQUE: Multidetector CT imaging of the chest was performed during
intravenous contrast administration.
CONTRAST:  60mL WNTNRR-DHH IOPAMIDOL (WNTNRR-DHH) INJECTION 61%

[Series 2: axial chest · axial · 0.60mm/px · z∈[-1266,-1004]mm · 12 of 155 slices shown, 15 images]
[im 12/155  mediastinal]
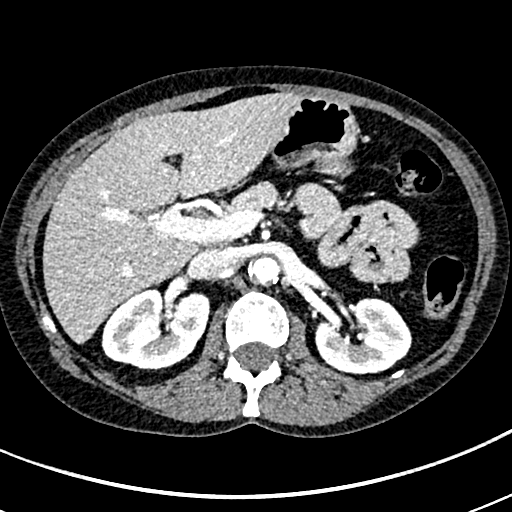
[im 12/155  lung]
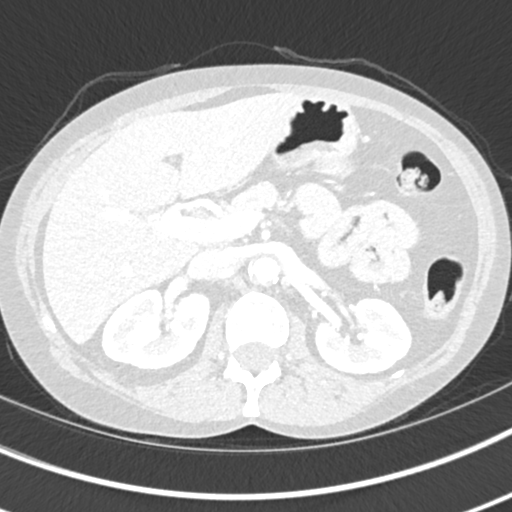
[im 24/155  lung]
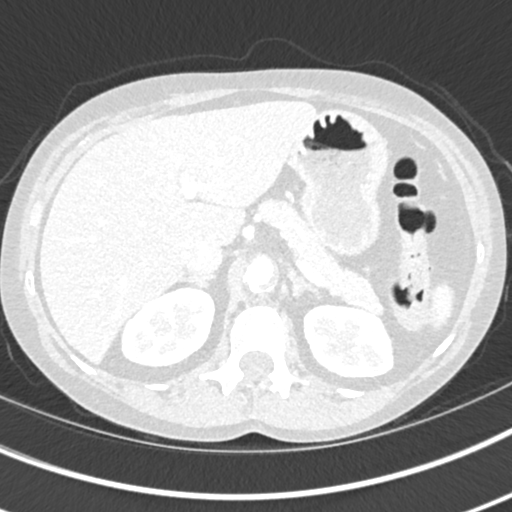
[im 36/155  lung]
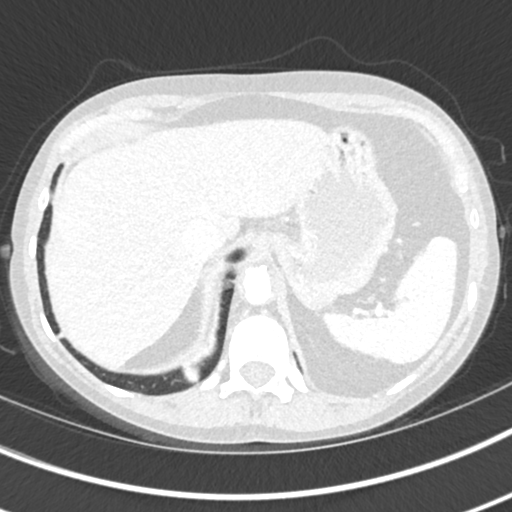
[im 48/155  lung]
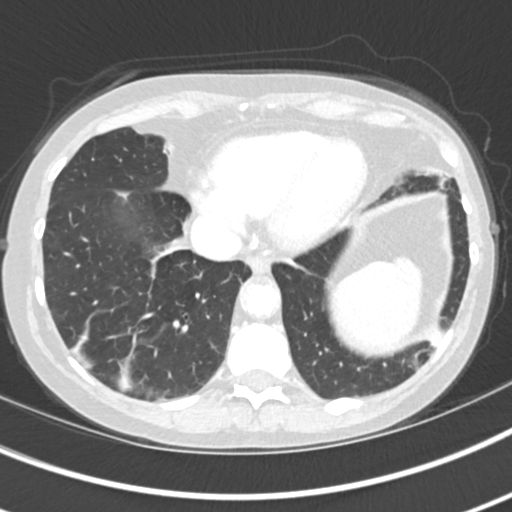
[im 60/155  mediastinal]
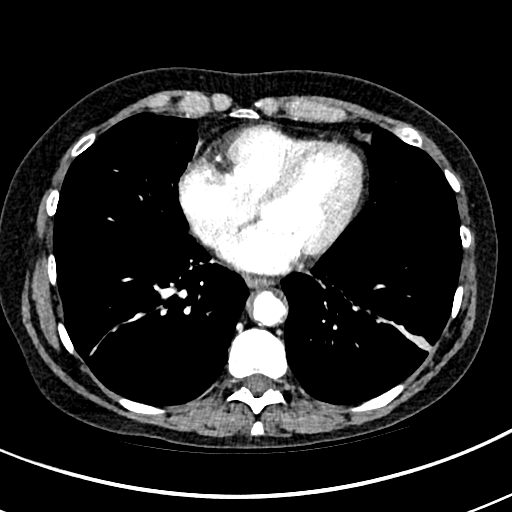
[im 60/155  lung]
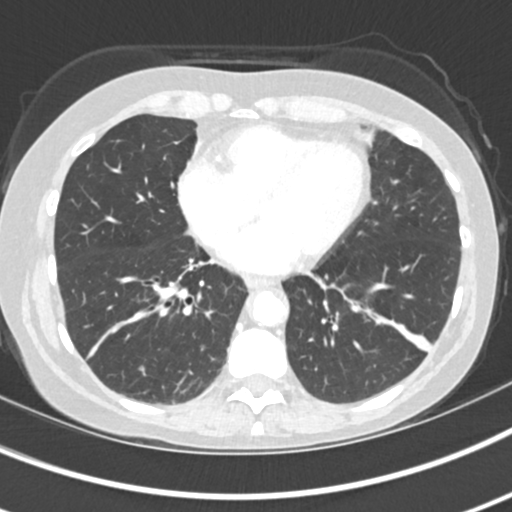
[im 72/155  lung]
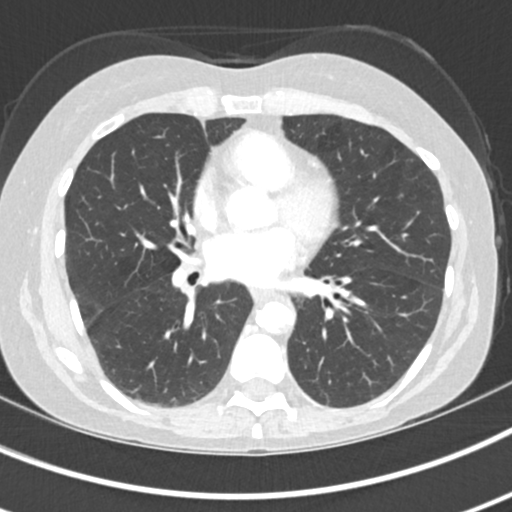
[im 83/155  lung]
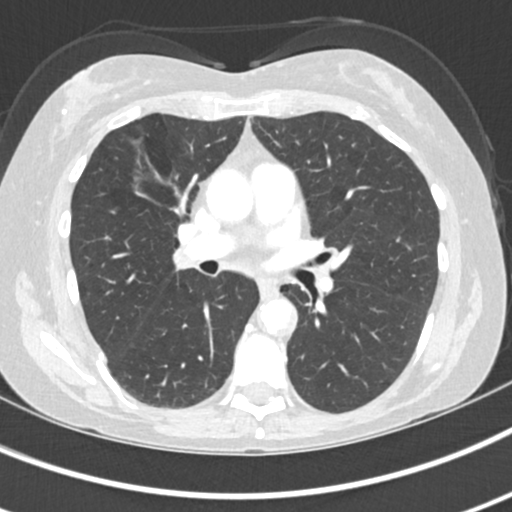
[im 95/155  lung]
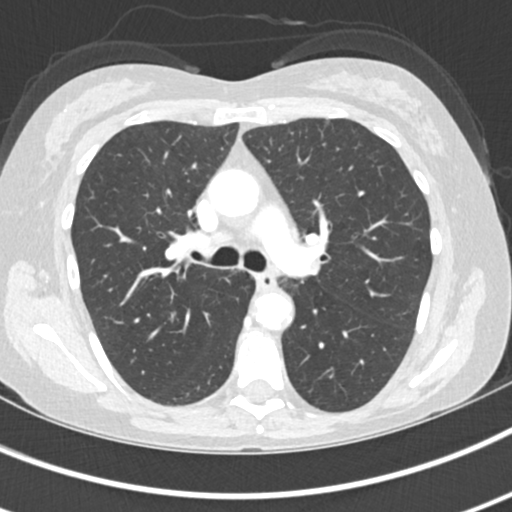
[im 107/155  mediastinal]
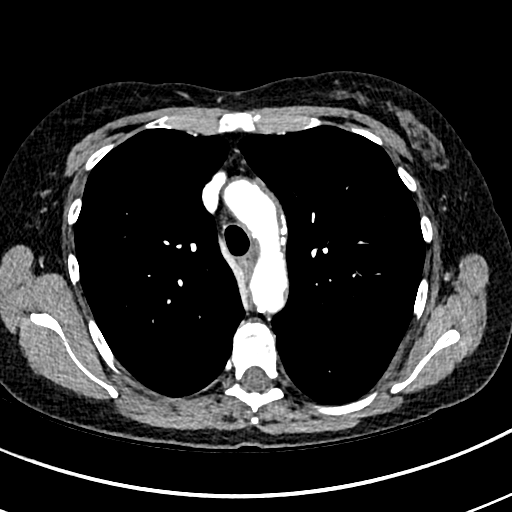
[im 107/155  lung]
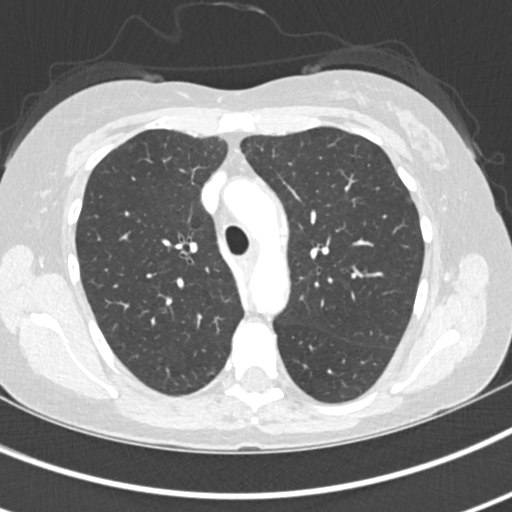
[im 119/155  lung]
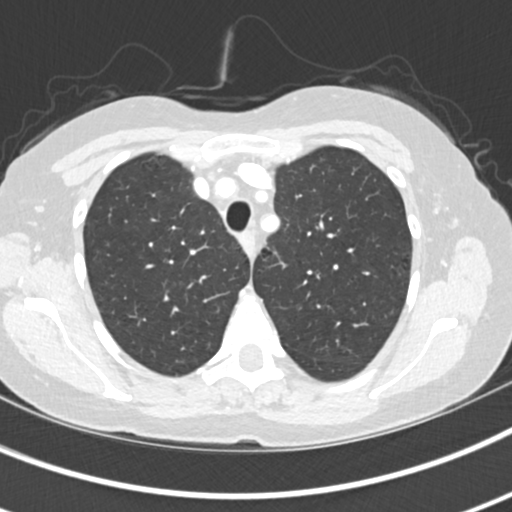
[im 131/155  lung]
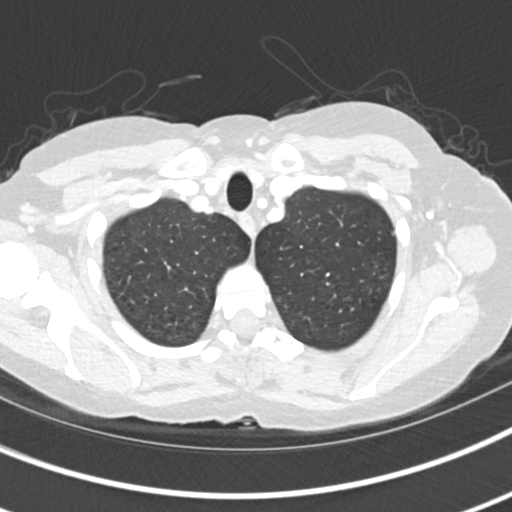
[im 143/155  lung]
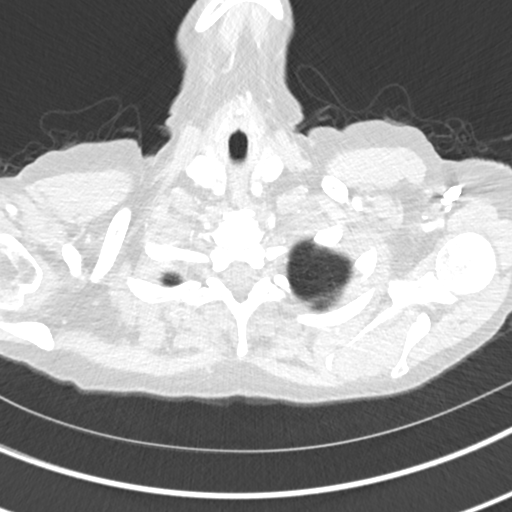

[Series 4: coronal chest · coronal · 0.60mm/px · 3 of 123 slices shown]
[im 25/123  lung]
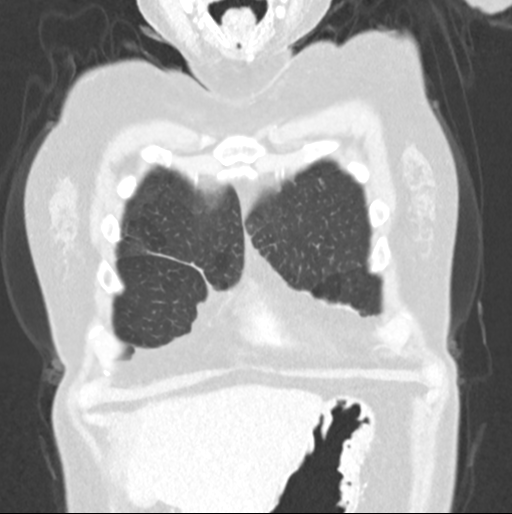
[im 49/123  lung]
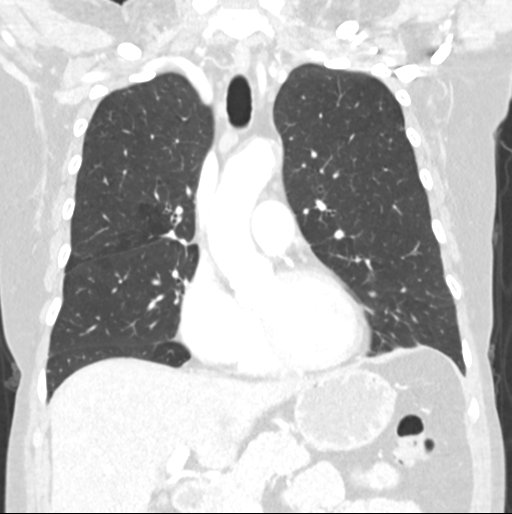
[im 74/123  lung]
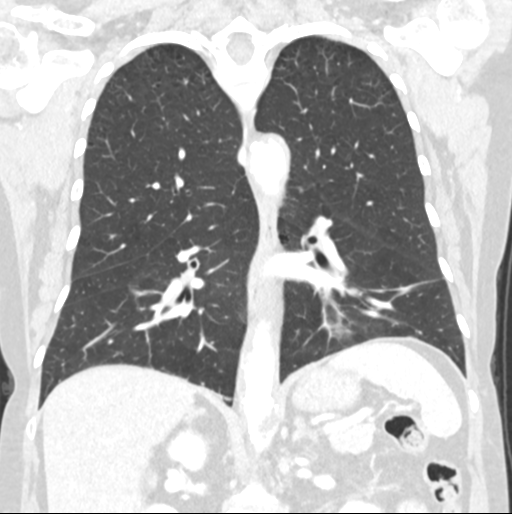

[15 of 36 positions shown; findings below may reference images not displayed]

FINDINGS: Cardiovascular: No significant vascular findings. Normal heart size.
No pericardial effusion. Extensive predominant fibrofatty plaque of
the aorta. Normal caliber thoracic aorta and main pulmonary artery.
Moderate calcifications of coronary arteries.

Mediastinum/Nodes: No enlarged mediastinal, hilar, or axillary lymph
nodes. Thyroid gland, trachea, and esophagus demonstrate no
significant findings.

Lungs/Pleura: Right upper lobe solid nodule measures 8 x 9 mm
(series 3, image 76). Additional scattered small ground-glass
nodules are stable. No consolidation, effusion, or pneumothorax.
Stable mild emphysema. Platelike atelectasis in lung bases.

Upper Abdomen: No acute abnormality.

Musculoskeletal: No chest wall abnormality. No acute or significant
osseous findings.
IMPRESSION: 1. Stable 8 x 9 mm solid nodule in the right upper lobe.
2. Extensive predominantly fibrofatty plaque of the aorta. Moderate
coronary artery calcific atherosclerosis.

## 2020-04-15 ENCOUNTER — Other Ambulatory Visit: Payer: Self-pay | Admitting: *Deleted

## 2020-04-15 ENCOUNTER — Encounter: Payer: Self-pay | Admitting: Internal Medicine

## 2020-04-15 ENCOUNTER — Ambulatory Visit (INDEPENDENT_AMBULATORY_CARE_PROVIDER_SITE_OTHER): Payer: 59 | Admitting: Internal Medicine

## 2020-04-15 ENCOUNTER — Other Ambulatory Visit: Payer: Self-pay

## 2020-04-15 VITALS — BP 119/63 | HR 75 | Ht 63.0 in | Wt 105.7 lb

## 2020-04-15 DIAGNOSIS — F17200 Nicotine dependence, unspecified, uncomplicated: Secondary | ICD-10-CM

## 2020-04-15 DIAGNOSIS — I10 Essential (primary) hypertension: Secondary | ICD-10-CM | POA: Diagnosis not present

## 2020-04-15 DIAGNOSIS — J410 Simple chronic bronchitis: Secondary | ICD-10-CM

## 2020-04-15 DIAGNOSIS — I7300 Raynaud's syndrome without gangrene: Secondary | ICD-10-CM

## 2020-04-15 DIAGNOSIS — M791 Myalgia, unspecified site: Secondary | ICD-10-CM

## 2020-04-15 LAB — POCT URINALYSIS DIPSTICK
Appearance: NORMAL
Bilirubin, UA: NEGATIVE
Blood, UA: NEGATIVE
Clarity, UA: NORMAL
Glucose, UA: NEGATIVE
Ketones, UA: NEGATIVE
Leukocytes, UA: NEGATIVE
Nitrite, UA: NEGATIVE
Protein, UA: NEGATIVE
Spec Grav, UA: 1.02 (ref 1.010–1.025)
Urobilinogen, UA: NEGATIVE E.U./dL — AB
pH, UA: 6 (ref 5.0–8.0)

## 2020-04-15 MED ORDER — TRAMADOL HCL 50 MG PO TABS
ORAL_TABLET | Freq: Three times a day (TID) | ORAL | 0 refills | Status: DC
  Filled 2020-04-15: qty 90, 30d supply, fill #0

## 2020-04-15 NOTE — Progress Notes (Signed)
Established Patient Office Visit  Subjective:  Patient ID: Tracey Walters, female    DOB: 1960-12-18  Age: 60 y.o. MRN: [id number]  CC:  Chief Complaint  Patient presents with  . Medication Refill    HPI  Tracey Walters presents forgeneral check up, patient is known to have hypertension Raynaud's disease also is known to have simple chronic bronchitis due to smoking.  She also has a history of myalgia and primary insomnia.  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  Past Medical History:  Diagnosis Date  . Anxiety   . Carpal tunnel syndrome    right wrist  . Dyspnea   . GERD (gastroesophageal reflux disease)   . Hyperlipidemia   . Hypertension 20 yrs  . Raynaud's phenomenon   . Rheumatoid arteritis ([place])   . Ulcer     Past Surgical History:  Procedure Laterality Date  . ABDOMINAL HYSTERECTOMY  1992  . BREAST BIOPSY Right 2011   neg  . BREAST BIOPSY Right 11/1996   Dense fibrosis,  benign epithelial hyperplasia.  Marland Kitchen CARPAL TUNNEL RELEASE Right 2012  . COLONOSCOPY  2010  . ENDOBRONCHIAL ULTRASOUND Right 01/25/2018   Procedure: ENDOBRONCHIAL ULTRASOUND;  Surgeon: Tyler Pita, MD;  Location: [facility] ORS;  Service: Cardiopulmonary;  Laterality: Right;  . TONSILLECTOMY    . TUBAL LIGATION    . UPPER GI ENDOSCOPY      Family History  Problem Relation Age of Onset  . Breast cancer Sister 48  . Breast cancer Maternal Aunt     Social History   Socioeconomic History  . Marital status: Divorced    Spouse name: Not on file  . Number of children: Not on file  . Years of education: Not on file  . Highest education level: Not on file  Occupational History  . Not on file  Tobacco Use  . Smoking status: Current Every Day Smoker    Packs/day: 2.00    Years: 42.00    Pack years: 84.00    Types: Cigarettes  . Smokeless tobacco: Never Used  . Tobacco comment: currently 2 packs/day  Vaping Use  . Vaping Use: Never used  Substance and Sexual  Activity  . Alcohol use: No  . Drug use: No  . Sexual activity: Not on file  Other Topics Concern  . Not on file  Social History Narrative  . Not on file   Social Determinants of Health   Financial Resource Strain: Not on file  Food Insecurity: Not on file  Transportation Needs: Not on file  Physical Activity: Not on file  Stress: Not on file  Social Connections: Not on file  Intimate Partner Violence: Not on file     Current Outpatient Medications:  .  ALPRAZolam (XANAX) 0.25 MG tablet, TAKE 1 TABLET (0.25 MG TOTAL) BY MOUTH AT BEDTIME AS NEEDED FOR ANXIETY OR SLEEP., Disp: 30 tablet, Rfl: 1 .  amLODipine-benazepril (LOTREL) 5-10 MG capsule, Take 1 capsule by mouth daily., Disp: 90 capsule, Rfl: 3 .  gabapentin (NEURONTIN) 300 MG capsule, Take 300 mg by mouth daily as needed (pain). , Disp: , Rfl: 3 .  pantoprazole (PROTONIX) 40 MG tablet, TAKE 1 TABLET BY MOUTH DAILY., Disp: 30 tablet, Rfl: 6 .  azithromycin (ZITHROMAX) 250 MG tablet, 2 tab po daily  For 3 days, Disp: 6 tablet, Rfl: 0 .  Cyanocobalamin (VITAMIN B 12 PO), Take 1,000 mg by mouth daily., Disp: , Rfl:  .  traMADol (  ULTRAM) 50 MG tablet, TAKE 1 TABLET (50 MG TOTAL) BY MOUTH 3 (THREE) TIMES DAILY., Disp: 90 tablet, Rfl: 0 .  varenicline (CHANTIX STARTING MONTH PAK) 0.5 MG X 11 & 1 MG X 42 tablet, As directed in package, Disp: 53 tablet, Rfl: 0   Allergies  Allergen Reactions  . Penicillins Hives    DID THE REACTION INVOLVE: Swelling of the face/tongue/throat, SOB, or low BP? No Sudden or severe rash/hives, skin peeling, or the inside of the mouth or nose? No Did it require medical treatment? No When did it last happen?unkn If all above answers are "NO", may proceed with cephalosporin use.     ROS Review of Systems  Constitutional: Negative.   HENT: Negative.   Eyes: Negative.   Respiratory: Negative.   Cardiovascular: Negative.   Gastrointestinal: Negative.   Endocrine: Negative.   Genitourinary:  Negative.   Musculoskeletal: Negative.   Skin: Negative.   Allergic/Immunologic: Negative.   Neurological: Negative.   Hematological: Negative.   Psychiatric/Behavioral: Negative.   All other systems reviewed and are negative.  Complain of pain in the left foot   Objective:    Physical Exam Vitals reviewed.  Constitutional:      Appearance: Normal appearance.  HENT:     Mouth/Throat:     Mouth: Mucous membranes are moist.  Eyes:     Pupils: Pupils are equal, round, and reactive to light.  Neck:     Vascular: No carotid bruit.  Cardiovascular:     Rate and Rhythm: Normal rate and regular rhythm.     Pulses: Normal pulses.     Heart sounds: Normal heart sounds.  Pulmonary:     Effort: Pulmonary effort is normal.     Breath sounds: Normal breath sounds.  Abdominal:     General: Bowel sounds are normal.     Palpations: Abdomen is soft. There is no hepatomegaly, splenomegaly or mass.     Tenderness: There is no abdominal tenderness.     Hernia: No hernia is present.  Musculoskeletal:        General: No tenderness.     Cervical back: Neck supple.     Right lower leg: No edema.     Left lower leg: No edema.  Skin:    Findings: No rash.  Neurological:     Mental Status: She is alert and oriented to person, place, and time.     Motor: No weakness.  Psychiatric:        Mood and Affect: Mood and affect normal.        Behavior: Behavior normal.   There is thickening of plantar fascia in the left foot.  BP 119/63   Pulse 75   Ht 5\' 3"  (1.6 m)   Wt 105 lb 11.2 oz (47.9 kg)   BMI 18.72 kg/m  Wt Readings from Last 3 Encounters:  04/15/20 105 lb 11.2 oz (47.9 kg)  02/12/20 107 lb 3.2 oz (48.6 kg)  11/20/19 103 lb (46.7 kg)     Health Maintenance Due  Topic Date Due  . Hepatitis C Screening  Never done  . COVID-19 Vaccine (1) Never done  . HIV Screening  Never done  . TETANUS/TDAP  Never done  . PAP SMEAR-Modifier  Never done  . COLONOSCOPY (Pts 45-38yrs  Insurance coverage will need to be confirmed)  Never done    There are no preventive care reminders to display for this patient.  Lab Results  Component Value Date   TSH 1.698  06/09/2017   Lab Results  Component Value Date   WBC 10.9 (H) 01/20/2018   HGB 14.9 01/20/2018   HCT 44.9 01/20/2018   MCV 95.7 01/20/2018   PLT 353 01/20/2018   Lab Results  Component Value Date   NA 138 01/20/2018   K 3.4 (L) 01/20/2018   CO2 28 01/20/2018   GLUCOSE 87 01/20/2018   BUN 7 10/03/2018   CREATININE 0.70 10/03/2018   BILITOT 0.5 09/19/2016   ALKPHOS 89 09/19/2016   AST 17 09/19/2016   ALT 6 (L) 09/19/2016   PROT 7.9 09/19/2016   ALBUMIN 3.9 09/19/2016   CALCIUM 10.2 01/20/2018   ANIONGAP 10 01/20/2018   Lab Results  Component Value Date   CHOL 247 (H) 06/09/2017   Lab Results  Component Value Date   HDL 44 06/09/2017   Lab Results  Component Value Date   LDLCALC 179 (H) 06/09/2017   Lab Results  Component Value Date   TRIG 119 06/09/2017   Lab Results  Component Value Date   CHOLHDL 5.6 06/09/2017   No results found for: HGBA1C    Assessment & Plan:   Problem List Items Addressed This Visit      Cardiovascular and Mediastinum   Essential hypertension    Patient blood pressure is normal patient denies any chest pain or shortness of breath there is no history of palpitation paroxysmal nocturnal dyspnea patient can walk  25yards without any problem patient was advised to follow low-salt low-cholesterol diet  I reviewed the results of Sprint trial  ideally I want to keep systolic blood pressure below 130 mmHg, patient was asked to check blood pressure 3 times a week and give me a report on that.  Patient will be follow-up in 3 months, patient will call me back for any change in the cardiovascular symptoms         Raynaud's disease without gangrene - Primary    We will draw complete labs which include RA factor and CRP metabolic profile and urinalysis.         Respiratory   Simple chronic bronchitis ([place])    Patient advised to quit smoking completely.        Other   Smoker    - I instructed the patient to stop smoking and provided them with smoking cessation materials.  - I informed the patient that smoking puts them at increased risk for cancer, COPD, hypertension, and more.  - Informed the patient to seek help if they begin to have trouble breathing, develop chest pain, start to cough up blood, feel faint, or pass out.      Myalgia    Chronic problem due to her  Raynauds disease.      Relevant Medications   traMADol (ULTRAM) 50 MG tablet      Meds ordered this encounter  Medications  . traMADol (ULTRAM) 50 MG tablet    Sig: TAKE 1 TABLET (50 MG TOTAL) BY MOUTH 3 (THREE) TIMES DAILY.    Dispense:  90 tablet    Refill:  0  Hypercholesterolemia  I advised the patient to follow Mediterranean diet This diet is rich in fruits vegetables and whole grain, and This diet is also rich in fish and lean meat Patient should also eat a handful of almonds or walnuts daily Recent heart study indicated that average follow-up on this kind of diet reduces the cardiovascular mortality by 50 to 70%==  Follow-up: No follow-ups on file.  1 month follow-up  Cletis Athens, MD

## 2020-04-15 NOTE — Assessment & Plan Note (Signed)
-   I instructed the patient to stop smoking and provided them with smoking cessation materials.  - I informed the patient that smoking puts them at increased risk for cancer, COPD, hypertension, and more.  - Informed the patient to seek help if they begin to have trouble breathing, develop chest pain, start to cough up blood, feel faint, or pass out.  

## 2020-04-15 NOTE — Assessment & Plan Note (Signed)
Patient advised to quit smoking completely 

## 2020-04-15 NOTE — Addendum Note (Signed)
Addended by: Alois Cliche on: 04/15/2020 02:12 PM   Modules accepted: Orders

## 2020-04-15 NOTE — Assessment & Plan Note (Signed)
We will draw complete labs which include RA factor and CRP metabolic profile and urinalysis.

## 2020-04-15 NOTE — Assessment & Plan Note (Signed)
Chronic problem due to her  Raynauds disease.

## 2020-04-15 NOTE — Assessment & Plan Note (Signed)

## 2020-04-17 LAB — LIPID PANEL
Cholesterol: 253 mg/dL — ABNORMAL HIGH (ref <200)
HDL: 60 mg/dL (ref >=50)
LDL Cholesterol (Calc): 170 mg/dL (calc) — ABNORMAL HIGH
Non-HDL Cholesterol (Calc): 193 mg/dL (calc) — ABNORMAL HIGH (ref <130)
Total CHOL/HDL Ratio: 4.2 (calc) (ref <5.0)
Triglycerides: 108 mg/dL (ref <150)

## 2020-04-17 LAB — COMPLETE METABOLIC PANEL WITH GFR
AG Ratio: 1.3 (calc) (ref 1.0–2.5)
ALT: 5 U/L — ABNORMAL LOW (ref 6–29)
AST: 22 U/L (ref 10–35)
Albumin: 3.8 g/dL (ref 3.6–5.1)
Alkaline phosphatase (APISO): 64 U/L (ref 37–153)
BUN: 10 mg/dL (ref 7–25)
CO2: 20 mmol/L (ref 20–32)
Calcium: 9.1 mg/dL (ref 8.6–10.4)
Chloride: 108 mmol/L (ref 98–110)
Creat: 0.69 mg/dL (ref 0.50–0.99)
GFR, Est African American: 110 mL/min/{1.73_m2} (ref >=60)
GFR, Est Non African American: 95 mL/min/{1.73_m2} (ref >=60)
Globulin: 3 g/dL (calc) (ref 1.9–3.7)
Glucose, Bld: 71 mg/dL (ref 65–99)
Potassium: 4.9 mmol/L (ref 3.5–5.3)
Sodium: 143 mmol/L (ref 135–146)
Total Bilirubin: 0.2 mg/dL (ref 0.2–1.2)
Total Protein: 6.8 g/dL (ref 6.1–8.1)

## 2020-04-17 LAB — CBC WITH DIFFERENTIAL/PLATELET
Absolute Monocytes: 588 cells/uL (ref 200–950)
Basophils Absolute: 77 cells/uL (ref 0–200)
Basophils Relative: 1.1 %
Eosinophils Absolute: 161 cells/uL (ref 15–500)
Eosinophils Relative: 2.3 %
HCT: 45.3 % — ABNORMAL HIGH (ref 35.0–45.0)
Hemoglobin: 15.4 g/dL (ref 11.7–15.5)
Lymphs Abs: 2107 cells/uL (ref 850–3900)
MCH: 32.6 pg (ref 27.0–33.0)
MCHC: 34 g/dL (ref 32.0–36.0)
MCV: 96 fL (ref 80.0–100.0)
MPV: 9.2 fL (ref 7.5–12.5)
Monocytes Relative: 8.4 %
Neutro Abs: 4067 cells/uL (ref 1500–7800)
Neutrophils Relative %: 58.1 %
Platelets: 379 10*3/uL (ref 140–400)
RBC: 4.72 10*6/uL (ref 3.80–5.10)
RDW: 12.2 % (ref 11.0–15.0)
Total Lymphocyte: 30.1 %
WBC: 7 10*3/uL (ref 3.8–10.8)

## 2020-04-17 LAB — C-REACTIVE PROTEIN: CRP: 8 mg/L — ABNORMAL HIGH (ref <8.0)

## 2020-04-17 LAB — ANA: Anti Nuclear Antibody (ANA): NEGATIVE

## 2020-04-17 LAB — RHEUMATOID FACTOR: Rheumatoid fact SerPl-aCnc: <14 IU/mL (ref <14)

## 2020-04-17 LAB — TSH: TSH: 1.27 mIU/L (ref 0.40–4.50)

## 2020-04-17 LAB — CK: Total CK: 71 U/L (ref 29–143)

## 2020-05-07 ENCOUNTER — Ambulatory Visit: Payer: 59 | Admitting: Internal Medicine

## 2020-05-13 ENCOUNTER — Ambulatory Visit: Payer: 59 | Admitting: Internal Medicine

## 2020-06-03 ENCOUNTER — Other Ambulatory Visit: Payer: Self-pay | Admitting: *Deleted

## 2020-06-03 DIAGNOSIS — M791 Myalgia, unspecified site: Secondary | ICD-10-CM

## 2020-06-03 MED ORDER — TRAMADOL HCL 50 MG PO TABS: ORAL_TABLET | Freq: Three times a day (TID) | ORAL | 0 refills | Status: DC

## 2020-06-04 ENCOUNTER — Other Ambulatory Visit: Payer: Self-pay

## 2020-06-04 MED ORDER — TRAMADOL HCL 50 MG PO TABS
50.0000 mg | ORAL_TABLET | Freq: Three times a day (TID) | ORAL | 0 refills | Status: DC
  Filled 2020-06-04: qty 90, 30d supply, fill #0

## 2020-06-20 ENCOUNTER — Other Ambulatory Visit: Payer: Self-pay

## 2020-06-20 ENCOUNTER — Inpatient Hospital Stay
Admission: EM | Admit: 2020-06-20 | Discharge: 2020-06-21 | DRG: 066 | Disposition: A | Discharge status: Other Acute Inpatient Hospital | Payer: PRIVATE HEALTH INSURANCE | Attending: Pulmonary Disease | Admitting: Pulmonary Disease

## 2020-06-20 ENCOUNTER — Emergency Department: Payer: PRIVATE HEALTH INSURANCE

## 2020-06-20 ENCOUNTER — Encounter: Payer: Self-pay | Admitting: *Deleted

## 2020-06-20 DIAGNOSIS — M47812 Spondylosis without myelopathy or radiculopathy, cervical region: Secondary | ICD-10-CM | POA: Diagnosis not present

## 2020-06-20 DIAGNOSIS — E785 Hyperlipidemia, unspecified: Secondary | ICD-10-CM | POA: Diagnosis not present

## 2020-06-20 DIAGNOSIS — M069 Rheumatoid arthritis, unspecified: Secondary | ICD-10-CM | POA: Diagnosis not present

## 2020-06-20 DIAGNOSIS — Y33XXXA Other specified events, undetermined intent, initial encounter: Secondary | ICD-10-CM | POA: Diagnosis not present

## 2020-06-20 DIAGNOSIS — R112 Nausea with vomiting, unspecified: Secondary | ICD-10-CM | POA: Diagnosis not present

## 2020-06-20 DIAGNOSIS — S066X9A Traumatic subarachnoid hemorrhage with loss of consciousness of unspecified duration, initial encounter: Secondary | ICD-10-CM | POA: Diagnosis not present

## 2020-06-20 DIAGNOSIS — W228XXA Striking against or struck by other objects, initial encounter: Secondary | ICD-10-CM | POA: Diagnosis not present

## 2020-06-20 DIAGNOSIS — Z716 Tobacco abuse counseling: Secondary | ICD-10-CM | POA: Diagnosis not present

## 2020-06-20 DIAGNOSIS — I10 Essential (primary) hypertension: Secondary | ICD-10-CM | POA: Diagnosis present

## 2020-06-20 DIAGNOSIS — S199XXA Unspecified injury of neck, initial encounter: Secondary | ICD-10-CM | POA: Diagnosis not present

## 2020-06-20 DIAGNOSIS — Z87828 Personal history of other (healed) physical injury and trauma: Secondary | ICD-10-CM | POA: Diagnosis not present

## 2020-06-20 DIAGNOSIS — K219 Gastro-esophageal reflux disease without esophagitis: Secondary | ICD-10-CM | POA: Diagnosis not present

## 2020-06-20 DIAGNOSIS — R519 Headache, unspecified: Secondary | ICD-10-CM

## 2020-06-20 DIAGNOSIS — I7300 Raynaud's syndrome without gangrene: Secondary | ICD-10-CM | POA: Diagnosis not present

## 2020-06-20 DIAGNOSIS — I6523 Occlusion and stenosis of bilateral carotid arteries: Secondary | ICD-10-CM | POA: Diagnosis not present

## 2020-06-20 DIAGNOSIS — Z880 Allergy status to penicillin: Secondary | ICD-10-CM | POA: Diagnosis not present

## 2020-06-20 DIAGNOSIS — Z79891 Long term (current) use of opiate analgesic: Secondary | ICD-10-CM

## 2020-06-20 DIAGNOSIS — S0990XA Unspecified injury of head, initial encounter: Secondary | ICD-10-CM | POA: Diagnosis not present

## 2020-06-20 DIAGNOSIS — Z79899 Other long term (current) drug therapy: Secondary | ICD-10-CM

## 2020-06-20 DIAGNOSIS — Z20822 Contact with and (suspected) exposure to covid-19: Secondary | ICD-10-CM | POA: Diagnosis not present

## 2020-06-20 DIAGNOSIS — I609 Nontraumatic subarachnoid hemorrhage, unspecified: Secondary | ICD-10-CM | POA: Diagnosis not present

## 2020-06-20 DIAGNOSIS — F17210 Nicotine dependence, cigarettes, uncomplicated: Secondary | ICD-10-CM | POA: Diagnosis present

## 2020-06-20 DIAGNOSIS — S066X0A Traumatic subarachnoid hemorrhage without loss of consciousness, initial encounter: Secondary | ICD-10-CM | POA: Diagnosis not present

## 2020-06-20 DIAGNOSIS — Z90710 Acquired absence of both cervix and uterus: Secondary | ICD-10-CM

## 2020-06-20 DIAGNOSIS — F419 Anxiety disorder, unspecified: Secondary | ICD-10-CM | POA: Diagnosis present

## 2020-06-20 LAB — CBC WITH DIFFERENTIAL/PLATELET
Abs Immature Granulocytes: 0.04 10*3/uL (ref 0.00–0.07)
Basophils Absolute: 0 10*3/uL (ref 0.0–0.1)
Basophils Relative: 0 %
Eosinophils Absolute: 0 10*3/uL (ref 0.0–0.5)
Eosinophils Relative: 0 %
HCT: 42.8 % (ref 36.0–46.0)
Hemoglobin: 14.6 g/dL (ref 12.0–15.0)
Immature Granulocytes: 0 %
Lymphocytes Relative: 12 %
Lymphs Abs: 1.3 10*3/uL (ref 0.7–4.0)
MCH: 33 pg (ref 26.0–34.0)
MCHC: 34.1 g/dL (ref 30.0–36.0)
MCV: 96.6 fL (ref 80.0–100.0)
Monocytes Absolute: 0.4 10*3/uL (ref 0.1–1.0)
Monocytes Relative: 4 %
Neutro Abs: 9.2 10*3/uL — ABNORMAL HIGH (ref 1.7–7.7)
Neutrophils Relative %: 84 %
Platelets: 329 10*3/uL (ref 150–400)
RBC: 4.43 MIL/uL (ref 3.87–5.11)
RDW: 12.4 % (ref 11.5–15.5)
WBC: 11 10*3/uL — ABNORMAL HIGH (ref 4.0–10.5)
nRBC: 0 % (ref 0.0–0.2)

## 2020-06-20 MED ORDER — ONDANSETRON HCL 4 MG/2ML IJ SOLN
4.0000 mg | Freq: Once | INTRAMUSCULAR | Status: AC
  Administered 2020-06-20: 4 mg via INTRAVENOUS
  Filled 2020-06-20: qty 2

## 2020-06-20 MED ORDER — MORPHINE SULFATE (PF) 4 MG/ML IV SOLN
4.0000 mg | Freq: Once | INTRAVENOUS | Status: AC
  Administered 2020-06-20: 4 mg via INTRAVENOUS
  Filled 2020-06-20: qty 1

## 2020-06-20 MED FILL — Pantoprazole Sodium EC Tab 40 MG (Base Equiv): ORAL | 30 days supply | Qty: 30 | Fill #0 | Status: CN

## 2020-06-20 NOTE — ED Triage Notes (Addendum)
Pt to triage via wheelchair.  Pt reports hitting her head on a counter top this morning while at work.  Tonight pt has a headache, n/v tonight.   Pt also has neck pain  Pt alert  speech clear.  States no WC

## 2020-06-20 NOTE — ED Notes (Signed)
Patient reports hitting her head on a cabinet at work around [place]. Patient reports hitting her occipital region while her head was down. Patient reports feeling fine until around 4pm, and reports at that time, she started to have nausea, vomiting, and a severe headache. Patient is alert, oriented x4, and reports severe headache continued at this time.

## 2020-06-20 NOTE — ED Notes (Signed)
ED Provider at bedside. Son at bedside as well. Patient and son updated on POC. Warm blanket provided. Lights dimmed for patient comfort.

## 2020-06-20 NOTE — ED Provider Notes (Addendum)
[facility] Emergency Department Provider Note   ____________________________________________   Event Date/Time   First MD Initiated Contact with Patient 06/20/20 2330     (approximate)  I have reviewed the triage vital signs and the nursing notes.   HISTORY  Chief Complaint Head Injury    HPI Tracey Walters is a 60 y.o. female who presents to the ED from home with a chief complaint of headache, nausea/vomiting and neck pain.  Patient reports approximately 10:30 AM she was bending over to pick something off the floor and raised her head up, striking the vertex of her head on a counter.  Denies LOC.  States she felt fine and finish her shift.  Approximately 4 PM she had sudden onset electrical type pain shooting from her neck into her head accompanied by nausea/vomiting which has progressed tonight.  Denies fever, cough, chest pain, shortness of breath, abdominal pain, diarrhea.  Denies use of anticoagulants.  Patient is vaccinated but not yet boosted against COVID-19.     Past Medical History:  Diagnosis Date   Anxiety    Carpal tunnel syndrome    right wrist   Dyspnea    GERD (gastroesophageal reflux disease)    Hyperlipidemia    Hypertension 20 yrs   Raynaud's phenomenon    Rheumatoid arteritis ([place])    Ulcer     Patient Active Problem List   Diagnosis Date Noted   Hemorrhagic stroke ([place]) 06/21/2020   Myalgia 02/12/2020   Primary insomnia 11/26/2019   Smoker 08/21/2019   Simple chronic bronchitis ([place]) 08/11/2019   Essential hypertension 08/11/2019   Raynaud's disease without gangrene 08/11/2019   History of colonic polyps 11/11/2016   Personal history of tobacco use, presenting hazards to health 06/26/2016   Annual physical exam 10/11/2013   Screening mammogram for high-risk patient 08/31/2012    Past Surgical History:  Procedure Laterality Date   ABDOMINAL HYSTERECTOMY  1992   BREAST BIOPSY Right 2011   neg   BREAST BIOPSY  Right 11/1996   Dense fibrosis,  benign epithelial hyperplasia.   CARPAL TUNNEL RELEASE Right 2012   COLONOSCOPY  2010   ENDOBRONCHIAL ULTRASOUND Right 01/25/2018   Procedure: ENDOBRONCHIAL ULTRASOUND;  Surgeon: Tyler Pita, MD;  Location: [facility] ORS;  Service: Cardiopulmonary;  Laterality: Right;   TONSILLECTOMY     TUBAL LIGATION     UPPER GI ENDOSCOPY      Prior to Admission medications   Medication Sig Start Date End Date Taking? Authorizing Provider  pantoprazole (PROTONIX) 40 MG tablet TAKE 1 TABLET BY MOUTH DAILY. 02/12/20 02/11/21 Yes Masoud, Viann Shove, MD  traMADol (ULTRAM) 50 MG tablet TAKE 1 TABLET (50 MG TOTAL) BY MOUTH 3 (THREE) TIMES DAILY. 06/03/20 11/30/20 Yes Masoud, Viann Shove, MD  traMADol (ULTRAM) 50 MG tablet Take 1 tablet (50 mg total) by mouth 3 (three) times daily. Patient taking differently: Take 50 mg by mouth 3 (three) times daily. "Occasionally" - per patient 06/03/20  Yes   amLODipine-benazepril (LOTREL) 5-10 MG capsule Take 1 capsule by mouth daily. 05/24/19   Cletis Athens, MD  azithromycin (ZITHROMAX) 250 MG tablet 2 tab po daily  For 3 days 08/21/19   Cletis Athens, MD  Cyanocobalamin (VITAMIN B 12 PO) Take 1,000 mg by mouth daily.    [provider]  gabapentin (NEURONTIN) 300 MG capsule Take 300 mg by mouth daily as needed (pain).  07/23/15   [provider]  varenicline (CHANTIX STARTING MONTH PAK) 0.5 MG X 11 &  1 MG X 42 tablet As directed in package 02/09/18   Tyler Pita, MD    Allergies Penicillins  Family History  Problem Relation Age of Onset   Breast cancer Sister 64   Breast cancer Maternal Aunt     Social History Social History   Tobacco Use   Smoking status: Every Day    Packs/day: 2.00    Years: 42.00    Pack years: 84.00    Types: Cigarettes   Smokeless tobacco: Never   Tobacco comments:    currently 2 packs/day  Vaping Use   Vaping Use: Never used  Substance Use Topics   Alcohol use: No   Drug use: No     Review of Systems  Constitutional: No fever/chills Eyes: No visual changes. ENT: No sore throat. Cardiovascular: Denies chest pain. Respiratory: Denies shortness of breath. Gastrointestinal: No abdominal pain.  Positive for nausea and vomiting.  No diarrhea.  No constipation. Genitourinary: Negative for dysuria. Musculoskeletal: Negative for back pain. Skin: Negative for rash. Neurological: Positive for headache. Negative for focal weakness or numbness.   ____________________________________________   PHYSICAL EXAM:  VITAL SIGNS: ED Triage Vitals  Enc Vitals Group     BP 06/20/20 2234 (!) 171/77     Pulse Rate 06/20/20 2234 75     Resp 06/20/20 2234 20     Temp 06/20/20 2234 97.7 F (36.5 C)     Temp Source 06/20/20 2234 Oral     SpO2 06/20/20 2234 99 %     Weight 06/20/20 2235 105 lb (47.6 kg)     Height 06/20/20 2235 5\' 3"  (1.6 m)     Head Circumference --      Peak Flow --      Pain Score 06/20/20 2235 10     Pain Loc --      Pain Edu? --      Excl. in [place]? --     Constitutional: Alert and oriented.  Uncomfortable appearing and in mild to moderate acute distress. Eyes: Conjunctivae are normal. PERRL. EOMI. + photophobia. Head: Atraumatic.  No lacerations or abrasions or bleeding. Nose: Atraumatic. Mouth/Throat: Mucous membranes are moist.  No dental malocclusion. Neck: No stridor.   Cardiovascular: Normal rate, regular rhythm. Grossly normal heart sounds.  Good peripheral circulation. Respiratory: Normal respiratory effort.  No retractions. Lungs CTAB. Gastrointestinal: Soft and nontender. No distention. No abdominal bruits. No CVA tenderness. Musculoskeletal: No lower extremity tenderness nor edema.  No joint effusions. Neurologic: Alert and oriented x3.  CN II to XII grossly intact.  Normal speech and language. No gross focal neurologic deficits are appreciated. MAEx4. Skin:  Skin is warm, dry and intact. No rash noted. Psychiatric: Mood and affect are  normal. Speech and behavior are normal.  ____________________________________________   LABS (all labs ordered are listed, but only abnormal results are displayed)  Labs Reviewed  CBC WITH DIFFERENTIAL/PLATELET - Abnormal; Notable for the following components:      Result Value   WBC 11.0 (*)    Neutro Abs 9.2 (*)    All other components within normal limits  COMPREHENSIVE METABOLIC PANEL - Abnormal; Notable for the following components:   Glucose, Bld 120 (*)    All other components within normal limits  ACETAMINOPHEN LEVEL - Abnormal; Notable for the following components:   Acetaminophen (Tylenol), Serum <10 (*)    All other components within normal limits  RESP PANEL BY RT-PCR (FLU A&B, COVID) ARPGX2  PROTIME-INR  SALICYLATE LEVEL  HIV ANTIBODY (  ROUTINE TESTING W REFLEX)  CBC  BASIC METABOLIC PANEL  MAGNESIUM  PHOSPHORUS  TROPONIN I (HIGH SENSITIVITY)  TROPONIN I (HIGH SENSITIVITY)   ____________________________________________  EKG  ED ECG REPORT I, Carlia Bomkamp J, the attending physician, personally viewed and interpreted this ECG.   Date: 06/20/2020  EKG Time: 2357  Rate: 63  Rhythm: normal EKG, normal sinus rhythm  Axis: Normal  Intervals:none  ST&T Change: Nonspecific  ____________________________________________  RADIOLOGY I, Nethan Caudillo J, personally viewed and evaluated these images (plain radiographs) as part of my medical decision making, as well as reviewing the written report by the radiologist.  ED MD interpretation: CT head demonstrated extensive SAH, recommend CTA; no cervical spine injury; CTA negative  Official radiology report(s): CT ANGIO HEAD NECK W WO CM  Result Date: 06/21/2020 CLINICAL DATA:  Initial evaluation for acute subarachnoid hemorrhage. EXAM: CT ANGIOGRAPHY HEAD AND NECK TECHNIQUE: Multidetector CT imaging of the head and neck was performed using the standard protocol during bolus administration of intravenous contrast. Multiplanar  CT image reconstructions and MIPs were obtained to evaluate the vascular anatomy. Carotid stenosis measurements (when applicable) are obtained utilizing NASCET criteria, using the distal internal carotid diameter as the denominator. CONTRAST:  70mL OMNIPAQUE IOHEXOL 350 MG/ML SOLN COMPARISON:  Prior head CT from earlier the same day. FINDINGS: CTA NECK FINDINGS Aortic arch: Visualized aortic arch normal in caliber with normal branch pattern. Moderate atheromatous change about the arch and origin of the great vessels without hemodynamically significant stenosis. Small amount of soft plaque and/or thrombus protrudes into the lumen of the proximal left subclavian artery (series 6, image 55). Similar change noted at the proximal aortic arch (series 6, image 29). Right carotid system: Right CCA patent from its origin to the bifurcation without stenosis. Mild atheromatous change about the right carotid bulb and proximal right ICA without stenosis. Right ICA patent distally without stenosis, dissection or occlusion. Left carotid system: Left CCA patent from its origin to the bifurcation without stenosis. Eccentric mixed plaque at the left carotid bulb/proximal left ICA without hemodynamically significant stenosis. Left ICA patent distally without stenosis, dissection or occlusion. Vertebral arteries: Both vertebral arteries arise from the subclavian arteries. No hemodynamically significant proximal subclavian artery stenosis. Left vertebral artery slightly dominant. Vertebral arteries patent within the neck without stenosis, dissection or occlusion. Skeleton: No visible acute osseous finding. No discrete or worrisome osseous lesions. Other neck: No other acute soft tissue abnormality within the neck. No mass or adenopathy. Upper chest: Emphysema. Visualized upper chest demonstrates no other acute finding. Review of the MIP images confirms the above findings CTA HEAD FINDINGS Anterior circulation: Petrous segments patent  bilaterally. Scattered atheromatous plaque within the carotid siphons without significant stenosis. A1 segments widely patent. Normal anterior communicating artery complex. Anterior cerebral arteries patent to their distal aspects without stenosis. No M1 stenosis or occlusion. Normal MCA bifurcations. Distal MCA branches well perfused and symmetric. Posterior circulation: Both vertebral arteries patent to the vertebrobasilar junction without stenosis. Both PICA origins patent and normal. Basilar patent to its distal aspect without stenosis. No basilar tip aneurysm. Superior cerebral arteries patent bilaterally. Both PCA supplied via the basilar as well as small bilateral posterior communicating arteries. PCAs well perfused to their distal aspects. Venous sinuses: Patent allowing for timing the contrast bolus. Anatomic variants: None significant. No intracranial aneurysm, arteriovenous malformation, or other structural vascular abnormality. No findings to explain acute subarachnoid hemorrhage. Review of the MIP images confirms the above findings IMPRESSION: 1. Negative CTA of the head and neck.  No intracranial aneurysm or other abnormality to explain the acute subarachnoid hemorrhage. No large vessel occlusion. 2. Atheromatous change about the carotid bifurcations and carotid siphons without hemodynamically significant stenosis. 3. Aortic Atherosclerosis (ICD10-I70.0) and Emphysema (ICD10-J43.9). Electronically Signed   By: Jeannine Boga M.D.   On: 06/21/2020 01:21   CT Head Wo Contrast  Result Date: 06/21/2020 CLINICAL DATA:  Headache, head injury, neck trauma, nausea and vomiting EXAM: CT HEAD WITHOUT CONTRAST CT CERVICAL SPINE WITHOUT CONTRAST TECHNIQUE: Multidetector CT imaging of the head and cervical spine was performed following the standard protocol without intravenous contrast. Multiplanar CT image reconstructions of the cervical spine were also generated. COMPARISON:  None. FINDINGS: CT HEAD  FINDINGS Brain: There is extensive subarachnoid hemorrhage filling the suprasellar cistern extending into the prepontine cistern and subsequently into the a spinal canal superiorly at the level of C1-2. Hemorrhage appears particularly dense the expected location of the basilar artery though a discrete lesion is not clearly identified on this noncontrast examination. No significant mass effect or midline shift. No abnormal intra or extra-axial mass lesion. Ventricular size is normal. Cerebellum is otherwise unremarkable. Vascular: Obscured by subarachnoid hemorrhage. Skull: Intact Sinuses/Orbits: The orbits are unremarkable. The paranasal sinuses are clear. Other: Mastoid air cells and middle ear cavities are clear. CT CERVICAL SPINE FINDINGS Alignment: Normal cervical lordosis.  No listhesis. Skull base and vertebrae: Craniocervical alignment is normal. The atlantodental interval is not widened. There is no acute fracture of the cervical spine. Ankylosis of the facets of C3-4 bilaterally. Soft tissues and spinal canal: Subarachnoid hemorrhage is seen at the skull base extending into the a spinal canal posterior to the odontoid process. The prevertebral soft tissues are unremarkable. No paraspinal fluid collection identified. Disc levels: There is mild intervertebral disc space narrowing and endplate remodeling at Z3-0 and C6-7 in keeping with changes of mild degenerative disc disease. The remaining intervertebral disc spaces are preserved. Review of the axial images demonstrates bilateral facet arthrosis resulting in mild bilateral neuroforaminal narrowing at C3-4 and to a lesser extent C4-5. Remaining neural foramina are widely patent. Upper chest: Unremarkable Other: None IMPRESSION: Extensive subarachnoid hemorrhage within the suprasellar, prepontine cisterns and extending into the spinal canal superiorly. CT arteriography is recommended for further evaluation. No acute fracture or listhesis of the cervical  spine. Electronically Signed   By: Fidela Salisbury MD   On: 06/21/2020 00:51   CT Cervical Spine Wo Contrast  Result Date: 06/21/2020 CLINICAL DATA:  Headache, head injury, neck trauma, nausea and vomiting EXAM: CT HEAD WITHOUT CONTRAST CT CERVICAL SPINE WITHOUT CONTRAST TECHNIQUE: Multidetector CT imaging of the head and cervical spine was performed following the standard protocol without intravenous contrast. Multiplanar CT image reconstructions of the cervical spine were also generated. COMPARISON:  None. FINDINGS: CT HEAD FINDINGS Brain: There is extensive subarachnoid hemorrhage filling the suprasellar cistern extending into the prepontine cistern and subsequently into the a spinal canal superiorly at the level of C1-2. Hemorrhage appears particularly dense the expected location of the basilar artery though a discrete lesion is not clearly identified on this noncontrast examination. No significant mass effect or midline shift. No abnormal intra or extra-axial mass lesion. Ventricular size is normal. Cerebellum is otherwise unremarkable. Vascular: Obscured by subarachnoid hemorrhage. Skull: Intact Sinuses/Orbits: The orbits are unremarkable. The paranasal sinuses are clear. Other: Mastoid air cells and middle ear cavities are clear. CT CERVICAL SPINE FINDINGS Alignment: Normal cervical lordosis.  No listhesis. Skull base and vertebrae: Craniocervical alignment is normal. The atlantodental interval  is not widened. There is no acute fracture of the cervical spine. Ankylosis of the facets of C3-4 bilaterally. Soft tissues and spinal canal: Subarachnoid hemorrhage is seen at the skull base extending into the a spinal canal posterior to the odontoid process. The prevertebral soft tissues are unremarkable. No paraspinal fluid collection identified. Disc levels: There is mild intervertebral disc space narrowing and endplate remodeling at Z6-1 and C6-7 in keeping with changes of mild degenerative disc disease. The  remaining intervertebral disc spaces are preserved. Review of the axial images demonstrates bilateral facet arthrosis resulting in mild bilateral neuroforaminal narrowing at C3-4 and to a lesser extent C4-5. Remaining neural foramina are widely patent. Upper chest: Unremarkable Other: None IMPRESSION: Extensive subarachnoid hemorrhage within the suprasellar, prepontine cisterns and extending into the spinal canal superiorly. CT arteriography is recommended for further evaluation. No acute fracture or listhesis of the cervical spine. Electronically Signed   By: Fidela Salisbury MD   On: 06/21/2020 00:51    ____________________________________________   PROCEDURES  Procedure(s) performed (including Critical Care):  .1-3 Lead EKG Interpretation  Date/Time: 06/20/2020 11:40 PM Performed by: Paulette Blanch, MD Authorized by: Paulette Blanch, MD     Interpretation: normal     ECG rate:  81   ECG rate assessment: normal     Rhythm: sinus rhythm     Ectopy: none     Conduction: normal   Comments:     Patient placed on cardiac monitor to evaluate for arrhythmias   CRITICAL CARE Performed by: Paulette Blanch   Total critical care time: 60 minutes  Critical care time was exclusive of separately billable procedures and treating other patients.  Critical care was necessary to treat or prevent imminent or life-threatening deterioration.  Critical care was time spent personally by me on the following activities: development of treatment plan with patient and/or surrogate as well as nursing, discussions with consultants, evaluation of patient's response to treatment, examination of patient, obtaining history from patient or surrogate, ordering and performing treatments and interventions, ordering and review of laboratory studies, ordering and review of radiographic studies, pulse oximetry and re-evaluation of patient's condition. ____________________________________________   INITIAL IMPRESSION /  ASSESSMENT AND PLAN / ED COURSE  As part of my medical decision making, I reviewed the following data within the [place] History obtained from family, Nursing notes reviewed and incorporated, Labs reviewed, EKG interpreted, Old chart reviewed, Radiograph reviewed, and Notes from prior ED visits     60 year old female presenting with headache, nausea/vomiting; minor head injury approximately 13 hours ago.  Differential diagnosis includes but is not limited to [place], CVA, infectious, metabolic etiologies, etc.  Wet read of CT head demonstrates hemorrhage, likely SAH.  Will obtain lab work, COVID swab, keep patient NPO.  IV Morphine and Zofran for pain/nausea.  Will discuss with neurosurgery after radiology interpretation of CT head.  Clinical Course as of 06/21/20 0731  Thu Jun 20, 2020  2330 Delay in radiology reading CT scan; will discuss with Dr. Cari Caraway from neurosurgery. [JS]  2341 Spoke with Dr. Cari Caraway who recommends obtaining CTA head and neck as etiology is unclear (traumatic versus spontaneous). [JS]  Fri Jun 21, 2020  0960 Dr. Christa See from radiology called me with patient's CT results.  She has already been for her CTA head/neck and now awaiting those results. [JS]  4540 Discussed with Dr. Cari Caraway CT angio head/neck.  He recommends repeat CT head at  7 AM. Patient may be discharged at that  time if CT is unchanged.  Patient continues to complain of severe headache and nausea.  Will repeat IV Morphine and Zofran.  Will discuss with CCU NP for admission. [JS]  9611 Accepted by NP [facility] for stepdown admission.  Have ordered time to repeat CT head for 7 AM. [JS]  0719 Dr. Cari Caraway called back this morning after reviewing patient's CT scans.  Given the large amount of blood, feels patient would be better served at a tertiary care center where there might be interventional capabilities if needed.  I have called Dr. Patsey Berthold from CCU to inform her we will transfer  patient from the ED.   Dr. Tamala Julian involved after change of shift to coordinate transfer.  Keppra and Nimodipine ordered per neurosurgery recommendations. [JS]    Clinical Course User Index [JS] Paulette Blanch, MD     ____________________________________________   FINAL CLINICAL IMPRESSION(S) / ED DIAGNOSES  Final diagnoses:  Subarachnoid hemorrhage ([place])  Acute intractable headache, unspecified headache type  Non-intractable vomiting with nausea, unspecified vomiting type     ED Discharge Orders     None        Note:  This document was prepared using Dragon voice recognition software and may include unintentional dictation errors.    Paulette Blanch, MD 06/21/20 6435    Paulette Blanch, MD 06/21/20 [phone]

## 2020-06-21 ENCOUNTER — Other Ambulatory Visit: Payer: Self-pay

## 2020-06-21 ENCOUNTER — Emergency Department: Payer: PRIVATE HEALTH INSURANCE

## 2020-06-21 ENCOUNTER — Inpatient Hospital Stay: Payer: PRIVATE HEALTH INSURANCE

## 2020-06-21 DIAGNOSIS — W228XXA Striking against or struck by other objects, initial encounter: Secondary | ICD-10-CM | POA: Diagnosis not present

## 2020-06-21 DIAGNOSIS — D72829 Elevated white blood cell count, unspecified: Secondary | ICD-10-CM | POA: Diagnosis not present

## 2020-06-21 DIAGNOSIS — Y33XXXA Other specified events, undetermined intent, initial encounter: Secondary | ICD-10-CM | POA: Diagnosis not present

## 2020-06-21 DIAGNOSIS — R52 Pain, unspecified: Secondary | ICD-10-CM | POA: Diagnosis not present

## 2020-06-21 DIAGNOSIS — M19042 Primary osteoarthritis, left hand: Secondary | ICD-10-CM | POA: Diagnosis not present

## 2020-06-21 DIAGNOSIS — M19041 Primary osteoarthritis, right hand: Secondary | ICD-10-CM | POA: Diagnosis not present

## 2020-06-21 DIAGNOSIS — Z720 Tobacco use: Secondary | ICD-10-CM | POA: Diagnosis not present

## 2020-06-21 DIAGNOSIS — Z89412 Acquired absence of left great toe: Secondary | ICD-10-CM | POA: Diagnosis not present

## 2020-06-21 DIAGNOSIS — J449 Chronic obstructive pulmonary disease, unspecified: Secondary | ICD-10-CM | POA: Diagnosis not present

## 2020-06-21 DIAGNOSIS — Z716 Tobacco abuse counseling: Secondary | ICD-10-CM | POA: Diagnosis not present

## 2020-06-21 DIAGNOSIS — I609 Nontraumatic subarachnoid hemorrhage, unspecified: Principal | ICD-10-CM

## 2020-06-21 DIAGNOSIS — Z743 Need for continuous supervision: Secondary | ICD-10-CM | POA: Diagnosis not present

## 2020-06-21 DIAGNOSIS — S066X1A Traumatic subarachnoid hemorrhage with loss of consciousness of 30 minutes or less, initial encounter: Secondary | ICD-10-CM | POA: Diagnosis not present

## 2020-06-21 DIAGNOSIS — W2209XA Striking against other stationary object, initial encounter: Secondary | ICD-10-CM | POA: Diagnosis not present

## 2020-06-21 DIAGNOSIS — R279 Unspecified lack of coordination: Secondary | ICD-10-CM | POA: Diagnosis not present

## 2020-06-21 DIAGNOSIS — I6523 Occlusion and stenosis of bilateral carotid arteries: Secondary | ICD-10-CM | POA: Diagnosis not present

## 2020-06-21 DIAGNOSIS — I7300 Raynaud's syndrome without gangrene: Secondary | ICD-10-CM | POA: Diagnosis not present

## 2020-06-21 DIAGNOSIS — R58 Hemorrhage, not elsewhere classified: Secondary | ICD-10-CM | POA: Diagnosis not present

## 2020-06-21 DIAGNOSIS — G44201 Tension-type headache, unspecified, intractable: Secondary | ICD-10-CM | POA: Diagnosis not present

## 2020-06-21 DIAGNOSIS — G4489 Other headache syndrome: Secondary | ICD-10-CM | POA: Diagnosis not present

## 2020-06-21 DIAGNOSIS — Z87828 Personal history of other (healed) physical injury and trauma: Secondary | ICD-10-CM | POA: Diagnosis not present

## 2020-06-21 DIAGNOSIS — F17210 Nicotine dependence, cigarettes, uncomplicated: Secondary | ICD-10-CM | POA: Diagnosis not present

## 2020-06-21 DIAGNOSIS — S066X0A Traumatic subarachnoid hemorrhage without loss of consciousness, initial encounter: Secondary | ICD-10-CM | POA: Diagnosis not present

## 2020-06-21 DIAGNOSIS — I10 Essential (primary) hypertension: Secondary | ICD-10-CM | POA: Diagnosis not present

## 2020-06-21 DIAGNOSIS — S066X9A Traumatic subarachnoid hemorrhage with loss of consciousness of unspecified duration, initial encounter: Secondary | ICD-10-CM | POA: Diagnosis not present

## 2020-06-21 DIAGNOSIS — R519 Headache, unspecified: Secondary | ICD-10-CM | POA: Diagnosis not present

## 2020-06-21 LAB — COMPREHENSIVE METABOLIC PANEL
ALT: 7 U/L (ref 0–44)
AST: 28 U/L (ref 15–41)
Albumin: 3.8 g/dL (ref 3.5–5.0)
Alkaline Phosphatase: 57 U/L (ref 38–126)
Anion gap: 10 (ref 5–15)
BUN: 13 mg/dL (ref 8–23)
CO2: 24 mmol/L (ref 22–32)
Calcium: 8.9 mg/dL (ref 8.9–10.3)
Chloride: 104 mmol/L (ref 98–111)
Creatinine, Ser: 0.57 mg/dL (ref 0.44–1.00)
GFR, Estimated: >60 mL/min (ref >60)
Glucose, Bld: 120 mg/dL — ABNORMAL HIGH (ref 70–99)
Potassium: 3.7 mmol/L (ref 3.5–5.1)
Sodium: 138 mmol/L (ref 135–145)
Total Bilirubin: 0.5 mg/dL (ref 0.3–1.2)
Total Protein: 7.6 g/dL (ref 6.5–8.1)

## 2020-06-21 LAB — RESP PANEL BY RT-PCR (FLU A&B, COVID) ARPGX2
Influenza A by PCR: NEGATIVE
Influenza B by PCR: NEGATIVE
SARS Coronavirus 2 by RT PCR: NEGATIVE

## 2020-06-21 LAB — CBC
HCT: 41.2 % (ref 36.0–46.0)
Hemoglobin: 14.2 g/dL (ref 12.0–15.0)
MCH: 32.9 pg (ref 26.0–34.0)
MCHC: 34.5 g/dL (ref 30.0–36.0)
MCV: 95.6 fL (ref 80.0–100.0)
Platelets: 303 10*3/uL (ref 150–400)
RBC: 4.31 MIL/uL (ref 3.87–5.11)
RDW: 12.4 % (ref 11.5–15.5)
WBC: 11.8 10*3/uL — ABNORMAL HIGH (ref 4.0–10.5)
nRBC: 0 % (ref 0.0–0.2)

## 2020-06-21 LAB — BASIC METABOLIC PANEL
Anion gap: 11 (ref 5–15)
BUN: 10 mg/dL (ref 8–23)
CO2: 22 mmol/L (ref 22–32)
Calcium: 8.8 mg/dL — ABNORMAL LOW (ref 8.9–10.3)
Chloride: 104 mmol/L (ref 98–111)
Creatinine, Ser: 0.66 mg/dL (ref 0.44–1.00)
GFR, Estimated: >60 mL/min (ref >60)
Glucose, Bld: 111 mg/dL — ABNORMAL HIGH (ref 70–99)
Potassium: 3.9 mmol/L (ref 3.5–5.1)
Sodium: 137 mmol/L (ref 135–145)

## 2020-06-21 LAB — ACETAMINOPHEN LEVEL: Acetaminophen (Tylenol), Serum: <10 ug/mL — ABNORMAL LOW (ref 10–30)

## 2020-06-21 LAB — TROPONIN I (HIGH SENSITIVITY)
Troponin I (High Sensitivity): 16 ng/L (ref <18)
Troponin I (High Sensitivity): 16 ng/L (ref <18)

## 2020-06-21 LAB — PROTIME-INR
INR: 1 (ref 0.8–1.2)
Prothrombin Time: 12.8 seconds (ref 11.4–15.2)

## 2020-06-21 LAB — HIV ANTIBODY (ROUTINE TESTING W REFLEX): HIV Screen 4th Generation wRfx: NONREACTIVE

## 2020-06-21 LAB — PHOSPHORUS: Phosphorus: 3 mg/dL (ref 2.5–4.6)

## 2020-06-21 LAB — SALICYLATE LEVEL: Salicylate Lvl: 21.5 mg/dL (ref 7.0–30.0)

## 2020-06-21 LAB — MAGNESIUM: Magnesium: 1.9 mg/dL (ref 1.7–2.4)

## 2020-06-21 MED ORDER — MORPHINE SULFATE (PF) 4 MG/ML IV SOLN
4.0000 mg | Freq: Once | INTRAVENOUS | Status: AC
  Administered 2020-06-21: 4 mg via INTRAVENOUS
  Filled 2020-06-21: qty 1

## 2020-06-21 MED ORDER — ONDANSETRON HCL 4 MG/2ML IJ SOLN
4.0000 mg | Freq: Once | INTRAMUSCULAR | Status: AC
  Administered 2020-06-21: 4 mg via INTRAVENOUS
  Filled 2020-06-21: qty 2

## 2020-06-21 MED ORDER — NIMODIPINE 30 MG PO CAPS: 30.0000 mg | ORAL_CAPSULE | ORAL | Status: DC

## 2020-06-21 MED ORDER — ACETAMINOPHEN 160 MG/5ML PO SOLN
650.0000 mg | ORAL | Status: DC | PRN
  Filled 2020-06-21: qty 20.3

## 2020-06-21 MED ORDER — PANTOPRAZOLE SODIUM 40 MG IV SOLR: 40.0000 mg | Freq: Every day | INTRAVENOUS | Status: DC

## 2020-06-21 MED ORDER — ACETAMINOPHEN 650 MG RE SUPP: 650.0000 mg | RECTAL | Status: DC | PRN

## 2020-06-21 MED ORDER — DOCUSATE SODIUM 100 MG PO CAPS: 100.0000 mg | ORAL_CAPSULE | Freq: Two times a day (BID) | ORAL | Status: DC | PRN

## 2020-06-21 MED ORDER — ACETAMINOPHEN 160 MG/5ML PO SOLN: 650.0000 mg | ORAL | 0 refills | Status: DC | PRN

## 2020-06-21 MED ORDER — SENNOSIDES-DOCUSATE SODIUM 8.6-50 MG PO TABS: 1.0000 | ORAL_TABLET | Freq: Two times a day (BID) | ORAL | Status: DC

## 2020-06-21 MED ORDER — FENTANYL CITRATE (PF) 100 MCG/2ML IJ SOLN: 50.0000 ug | INTRAMUSCULAR | Status: DC | PRN

## 2020-06-21 MED ORDER — ACETAMINOPHEN 325 MG PO TABS
650.0000 mg | ORAL_TABLET | ORAL | Status: DC | PRN
Start: 2020-06-21 — End: 2020-06-21

## 2020-06-21 MED ORDER — ACETAMINOPHEN 10 MG/ML IV SOLN: 1000.0000 mg | Freq: Once | INTRAVENOUS | 0 refills | Status: AC

## 2020-06-21 MED ORDER — LEVETIRACETAM IN NACL 500 MG/100ML IV SOLN
500.0000 mg | Freq: Two times a day (BID) | INTRAVENOUS | Status: DC
  Administered 2020-06-21: 500 mg via INTRAVENOUS
  Filled 2020-06-21 (×2): qty 100

## 2020-06-21 MED ORDER — DOCUSATE SODIUM 100 MG PO CAPS: 100.0000 mg | ORAL_CAPSULE | Freq: Two times a day (BID) | ORAL | 0 refills | Status: DC | PRN

## 2020-06-21 MED ORDER — ACETAMINOPHEN 325 MG PO TABS: 650.0000 mg | ORAL_TABLET | ORAL | Status: DC | PRN

## 2020-06-21 MED ORDER — ACETAMINOPHEN 10 MG/ML IV SOLN
1000.0000 mg | Freq: Once | INTRAVENOUS | Status: AC
  Administered 2020-06-21: 1000 mg via INTRAVENOUS
  Filled 2020-06-21: qty 100

## 2020-06-21 MED ORDER — ACETAMINOPHEN 650 MG RE SUPP: 650.0000 mg | RECTAL | 0 refills | Status: DC | PRN

## 2020-06-21 MED ORDER — FENTANYL CITRATE (PF) 100 MCG/2ML IJ SOLN: 50.0000 ug | INTRAMUSCULAR | 0 refills | Status: DC | PRN

## 2020-06-21 MED ORDER — IOHEXOL 350 MG/ML SOLN
75.0000 mL | Freq: Once | INTRAVENOUS | Status: AC | PRN
  Administered 2020-06-21: 75 mL via INTRAVENOUS

## 2020-06-21 MED ORDER — POLYETHYLENE GLYCOL 3350 17 G PO PACK: 17.0000 g | PACK | Freq: Every day | ORAL | Status: DC | PRN

## 2020-06-21 MED ORDER — FENTANYL CITRATE (PF) 100 MCG/2ML IJ SOLN
25.0000 ug | INTRAMUSCULAR | Status: DC | PRN
  Administered 2020-06-21: 25 ug via INTRAVENOUS
  Filled 2020-06-21: qty 2

## 2020-06-21 MED ORDER — NIMODIPINE 30 MG PO CAPS
30.0000 mg | ORAL_CAPSULE | ORAL | Status: DC
  Administered 2020-06-21: 30 mg via ORAL
  Filled 2020-06-21 (×4): qty 1

## 2020-06-21 MED ORDER — STROKE: EARLY STAGES OF RECOVERY BOOK: Freq: Once | Status: DC

## 2020-06-21 MED ORDER — LEVETIRACETAM IN NACL 500 MG/100ML IV SOLN: 500.0000 mg | Freq: Two times a day (BID) | INTRAVENOUS | Status: DC

## 2020-06-21 MED ORDER — POLYETHYLENE GLYCOL 3350 17 G PO PACK: 17.0000 g | PACK | Freq: Every day | ORAL | 0 refills | Status: DC | PRN

## 2020-06-21 NOTE — ED Notes (Signed)
Images powereshared by CT [id number]

## 2020-06-21 NOTE — ED Notes (Signed)
Accepted to [facility], [place]  ED to ED report number [phone]

## 2020-06-21 NOTE — H&P (Addendum)
NAME:  Tracey Walters, MRN:  [id number], DOB:  06-Dec-1960, LOS: 0 ADMISSION DATE:  06/20/2020, CONSULTATION DATE: 06/21/20 REFERRING MD: Lurline Hare MD, CHIEF COMPLAINT: Headache, nausea , vomiting    History of present illness   60 Y.O female with significant past medical history as below presenting to the ED with chief complaints of headache, nausea and vomiting.  Patient state she was at work and bent over to pick something off the floor and when she raise up striked the vertex of her head on a counter. Patient's significant other report that she did not immediately seek medical attention until she began having symptoms of headache associated with nausea, vomiting and photosensitivity. Patient denies LOC. She continue to work until approximately late evening when she had sudden onset  electrical type pain shooting down her neck with worsening headache. Patient's significant other convinced her to come to the ED for further evaluation.  ED COURSE: On arrival to the ED, she was afebrile with blood pressure 171/77 mm Hg and pulse rate 75 beats/min. There were no focal neurological deficits; she was alert and oriented x4, but appeared very uncomfortable and sensitive to light. Initial labs revealed WBC 11.0 and glucose of 120 otherwise unremarkable CBC and CMP. A non contrast CT head was obtained and showed Extensive subarachnoid hemorrhage within the suprasellar, prepontine cisterns and extending into the spinal canal superiorly. CT Cervical spine negative for acute fracture or listhesis of the cervical spine. Negative CTA of the head and neck. No intracranial aneurysm or other abnormality to explain the acute subarachnoid hemorrhage. No large vessel occlusion. Neurosurgery was consulted by EDP who recommended repeat imaging. PCCM consulted for admission to the ICU.  Past Medical History  Anxiety Carpal Tunnel GERD Hypertension Hyperlipidemia Raynaud's Phenomenon RA Current  Smoker  Significant Hospital Events   6/9: Admit to stepdown unit with [facility]  Consults:  Neurosurgery  Procedures:  None  Significant Diagnostic Tests:  6/9: CT Cervical Spine> 6/9: Noncontrast CT head> 6/10: CTA Head and Neck> Micro Data:  6/9: SARS-CoV-2 PCR>> negative 6/9: Influenza PCR>> negative  Antimicrobials:  None  OBJECTIVE  Blood pressure (!) 146/66, pulse 70, temperature 97.7 F (36.5 C), temperature source Oral, resp. rate 13, height 5\' 3"  (1.6 m), weight 47.6 kg, SpO2 95 %.       No intake or output data in the 24 hours ending 06/21/20 0224 Filed Weights   06/20/20 2235  Weight: 47.6 kg     Physical Examination  GENERAL: 60 year-old critically ill patient lying in the bed with no acute distress.  EYES: Pupils equal, round, reactive to light and accommodation. No scleral icterus. Extraocular muscles intact.  HEENT: Head atraumatic, normocephalic. Oropharynx and nasopharynx clear.  NECK:  Supple, no jugular venous distention. No thyroid enlargement, no tenderness.  LUNGS: Normal breath sounds bilaterally, no wheezing, rales,rhonchi or crepitation. No use of accessory muscles of respiration.  CARDIOVASCULAR: S1, S2 normal. No murmurs, rubs, or gallops.  ABDOMEN: Soft, nontender, nondistended. Bowel sounds present. No organomegaly or mass.  EXTREMITIES: No pedal edema, cyanosis, or clubbing.  NEUROLOGIC: Cranial nerves II through XII are intact.  Muscle strength 5/5 in all extremities. Sensation intact. Gait not checked.  PSYCHIATRIC: The patient is alert and oriented x 3.  SKIN: No obvious rash, lesion, or ulcer.   Labs/imaging that I havepersonally reviewed  (right click and "Reselect all SmartList Selections" daily)   Date: 06/21/2020  EKG Time: 02:15  Rate: 63  Rhythm: normal EKG, normal sinus rhythm  Axis: Normal  Intervals:none  ST&T Change: Nonspecific    Labs   CBC: Recent Labs  Lab 06/20/20 2325  WBC 11.0*  NEUTROABS 9.2*  HGB 14.6   HCT 42.8  MCV 96.6  PLT 353    Basic Metabolic Panel: Recent Labs  Lab 06/20/20 2325  NA 138  K 3.7  CL 104  CO2 24  GLUCOSE 120*  BUN 13  CREATININE 0.57  CALCIUM 8.9   GFR: Estimated Creatinine Clearance: 55.5 mL/min (by C-G formula based on SCr of 0.57 mg/dL). Recent Labs  Lab 06/20/20 2325  WBC 11.0*    Liver Function Tests: Recent Labs  Lab 06/20/20 2325  AST 28  ALT 7  ALKPHOS 57  BILITOT 0.5  PROT 7.6  ALBUMIN 3.8   No results for input(s): LIPASE, AMYLASE in the last 168 hours. No results for input(s): AMMONIA in the last 168 hours.  ABG No results found for: PHART, PCO2ART, PO2ART, HCO3, TCO2, ACIDBASEDEF, O2SAT   Coagulation Profile: Recent Labs  Lab 06/20/20 2325  INR 1.0    Cardiac Enzymes: No results for input(s): CKTOTAL, CKMB, CKMBINDEX, TROPONINI in the last 168 hours.  HbA1C: No results found for: HGBA1C  CBG: No results for input(s): GLUCAP in the last 168 hours.  Review of Systems:   Review of Systems  Constitutional: Negative.   HENT: Negative.    Eyes:  Positive for photophobia. Negative for blurred vision, double vision, pain, discharge and redness.  Respiratory: Negative.    Cardiovascular: Negative.   Gastrointestinal:  Positive for nausea and vomiting.  Genitourinary: Negative.   Musculoskeletal:  Positive for falls and neck pain.  Skin: Negative.   Neurological:  Positive for dizziness, tingling, sensory change and headaches. Negative for speech change, focal weakness, seizures, loss of consciousness and weakness.  Endo/Heme/Allergies: Negative.   Psychiatric/Behavioral:  The patient is nervous/anxious.    Past Medical History  She,  has a past medical history of Anxiety, Carpal tunnel syndrome, Dyspnea, GERD (gastroesophageal reflux disease), Hyperlipidemia, Hypertension (20 yrs), Raynaud's phenomenon, Rheumatoid arteritis ([place]), and Ulcer.   Surgical History    Past Surgical History:  Procedure Laterality  Date   ABDOMINAL HYSTERECTOMY  1992   BREAST BIOPSY Right 2011   neg   BREAST BIOPSY Right 11/1996   Dense fibrosis,  benign epithelial hyperplasia.   CARPAL TUNNEL RELEASE Right 2012   COLONOSCOPY  2010   ENDOBRONCHIAL ULTRASOUND Right 01/25/2018   Procedure: ENDOBRONCHIAL ULTRASOUND;  Surgeon: Tyler Pita, MD;  Location: [facility] ORS;  Service: Cardiopulmonary;  Laterality: Right;   TONSILLECTOMY     TUBAL LIGATION     UPPER GI ENDOSCOPY       Social History   reports that she has been smoking cigarettes. She has a 84.00 pack-year smoking history. She has never used smokeless tobacco. She reports that she does not drink alcohol and does not use drugs.   Family History   Her family history includes Breast cancer in her maternal aunt; Breast cancer (age of onset: 60) in her sister.   Allergies Allergies  Allergen Reactions   Penicillins Hives    DID THE REACTION INVOLVE: Swelling of the face/tongue/throat, SOB, or low BP? No Sudden or severe rash/hives, skin peeling, or the inside of the mouth or nose? No Did it require medical treatment? No When did it last happen?      unkn If all above answers are "NO", may proceed with cephalosporin use.      Home Medications  Prior to Admission medications   Medication Sig Start Date End Date Taking? Authorizing Provider  pantoprazole (PROTONIX) 40 MG tablet TAKE 1 TABLET BY MOUTH DAILY. 02/12/20 02/11/21 Yes Masoud, Viann Shove, MD  traMADol (ULTRAM) 50 MG tablet TAKE 1 TABLET (50 MG TOTAL) BY MOUTH 3 (THREE) TIMES DAILY. 06/03/20 11/30/20 Yes Masoud, Viann Shove, MD  traMADol (ULTRAM) 50 MG tablet Take 1 tablet (50 mg total) by mouth 3 (three) times daily. Patient taking differently: Take 50 mg by mouth 3 (three) times daily. "Occasionally" - per patient 06/03/20  Yes   amLODipine-benazepril (LOTREL) 5-10 MG capsule Take 1 capsule by mouth daily. 05/24/19   Cletis Athens, MD  azithromycin (ZITHROMAX) 250 MG tablet 2 tab po daily  For 3 days 08/21/19    Cletis Athens, MD  Cyanocobalamin (VITAMIN B 12 PO) Take 1,000 mg by mouth daily.    [provider]  gabapentin (NEURONTIN) 300 MG capsule Take 300 mg by mouth daily as needed (pain).  07/23/15   [provider]  varenicline (CHANTIX STARTING MONTH PAK) 0.5 MG X 11 & 1 MG X 42 tablet As directed in package 02/09/18   Tyler Pita, MD    Scheduled Meds:   stroke: mapping our early stages of recovery book   Does not apply Once   pantoprazole (PROTONIX) IV  40 mg Intravenous QHS   senna-docusate  1 tablet Oral BID   Continuous Infusions: PRN Meds:.acetaminophen **OR** acetaminophen (TYLENOL) oral liquid 160 mg/5 mL **OR** acetaminophen, docusate sodium, polyethylene glycol   Active Hospital Problem list   Subarachnoid Hemorrhage  Assessment & Plan:   Subarachnoid Hemorrhage post traumatic fall, Traumatic vs Spontaneous CT head shows extensive subarachnoid hemorrhage within the suprasellar, prepontine cisterns and extending into the spinal canal superiorly  - Admit to ICU with Neurosurgery service following - Vital signs/blood pressure with neuro checks/NIHSS  per  protocol  - keep BP <140/60 with short-acting antihypertensives (Labetolol, Hydralazine or Nicardipine Gtt as needed).  - check fasting Lipid Panel with Direct LDL in AM.  - check HgA1c, TSH  - Will repeat head CT in 24 hours  - Place SCDs for DVT prevention. - NPO for now until passes swallow screen  - PT consult, OT consult, Speech consult - Aspiration precautions, Seizures and Bleeding precautions - Hold NSAID, Anticoagulation - PRN antiemetic - Obtain STAT head CT for any new acute headache or new neurological deficits - Neurosurgery Consult  Hypertension Home meds: AmLoDipine-benazepril - Continue home meds as BP permits  GERD -Continue Protonix   Tobacco Abuse -Smoking cessation counseling - On Chantix  Best practice:  Diet:  NPO Pain/Anxiety/Delirium protocol (if indicated): Yes  (RASS goal 0) VAP protocol (if indicated): Not indicated DVT prophylaxis: Contraindicated GI prophylaxis: PPI Glucose control:  SSI No Central venous access:  N/A Arterial line:  N/A Foley:  N/A Mobility:  bed rest  PT consulted: N/A Last date of multidisciplinary goals of care discussion [6/9] Code Status:  full code Disposition: FULL  Critical care time: 45 mins     Rufina Falco, DNP, CCRN, FNP-C, AGACNP-BC Acute Care Nurse Practitioner  [facility] Pager: [phone] [place] [facility]  .

## 2020-06-21 NOTE — Progress Notes (Addendum)
SLP Cancellation Note  Patient Details Name: Tracey Walters MRN: [id number] DOB: [birth year]   Cancelled treatment:       Reason Eval/Treat Not Completed:  (chart reviewed). Cognitive-linguistic evaluation order noted. Patient noted with pending transfer to higher level of care for management of SAH. Discharge Summary in chart. ST services can be available if plans change. Please re-consult as medically appropriate should transfer plans change.    Orinda Kenner, MS, CCC-SLP Speech Language Pathologist Rehab Services [phone] [facility] 06/21/2020, 8:42 AM

## 2020-06-21 NOTE — ED Notes (Signed)
Patient transported to CT 

## 2020-06-21 NOTE — ED Notes (Signed)
Patient makes complaint of headache, fullness in the front of her head.  11/10.  Spoke with covering NP, advises that she will place order.

## 2020-06-21 NOTE — ED Provider Notes (Signed)
I assumed care of patient approximately 0 700.  In brief patient presented for headache.  She was found to have a subarachnoid thought to be nontraumatic.  Images were reviewed by neurosurgery who recommended transfer to [facility].  Patient transferred in stable condition.   Lucrezia Starch, MD 06/21/20 1357

## 2020-06-21 NOTE — ED Notes (Signed)
ED Provider at bedside. 

## 2020-06-21 NOTE — ED Notes (Signed)
Called [facility] for transport to [facility] ED  [phone]

## 2020-06-21 NOTE — ED Notes (Signed)
Lab at bedside

## 2020-06-21 NOTE — ED Notes (Signed)
Patient signed paper consent to be transferred to [facility]. Consent placed in box to be sent to medical records.

## 2020-06-21 NOTE — ED Notes (Signed)
Patient is resting comfortably. 

## 2020-06-21 NOTE — Discharge Instructions (Signed)
Discharge to [facility] for Neurosurgery.  Recommendations as per Neurosurgery

## 2020-06-21 NOTE — ED Notes (Signed)
EMTALA checked and reviewed by this RN. All documentation is correct and up to date.

## 2020-06-21 NOTE — Discharge Summary (Signed)
Physician Discharge Summary  Patient ID: Caylen Kuwahara MRN: [id number] DOB/AGE: 08-18-1960 60 y.o.  Admit date: 06/20/2020 Discharge date: 06/21/2020       DISCHARGE DIAGNOSES:  Subarachnoid Hemorrhage post traumatic fall, Traumatic vs Spontaneous Hypertension GERD Tobacco Abuse                                                                     DISCHARGE PLAN BY DIAGNOSIS    Subarachnoid Hemorrhage post traumatic fall, Traumatic vs Spontaneous CT head shows extensive subarachnoid hemorrhage within the suprasellar, prepontine cisterns and extending into the spinal canal superiorly   - Admit to ICU with Neurosurgery service following - Vital signs/blood pressure with neuro checks/NIHSS  per  protocol - keep BP <140/60 with short-acting antihypertensives (Labetolol, Hydralazine or Nicardipine Gtt as needed). - check fasting Lipid Panel with Direct LDL in AM. - check HgA1c, TSH - Will repeat head CT in 24 hours - Place SCDs for DVT prevention. - NPO for now until passes swallow screen - PT consult, OT consult, Speech consult - Aspiration precautions, Seizures and Bleeding precautions - Hold NSAID, Anticoagulation - PRN antiemetic - Obtain STAT head CT for any new acute headache or new neurological deficits - Neurosurgery Consult   Hypertension Home meds: AmLoDipine-benazepril - Continue home meds as BP permits   GERD -Continue Protonix   Tobacco Abuse -Smoking cessation counseling - On Chantix                        DISCHARGE SUMMARY:   60 Y.O female with significant past medical history as below presenting to the ED with chief complaints of headache, nausea and vomiting.   Patient state she was at work and bent over to pick something off the floor and when she raise up striked the vertex of her head on a counter. Patient's significant other report that she did not immediately seek medical attention until she began having symptoms of headache associated with  nausea, vomiting and photosensitivity. Patient denies LOC. She continue to work until approximately late evening when she had sudden onset  electrical type pain shooting down her neck with worsening headache. Patient's significant other convinced her to come to the ED for further evaluation.   ED COURSE: On arrival to the ED, she was afebrile with blood pressure 171/77 mm Hg and pulse rate 75 beats/min. There were no focal neurological deficits; she was alert and oriented x4, but appeared very uncomfortable and sensitive to light. Initial labs revealed WBC 11.0 and glucose of 120 otherwise unremarkable CBC and CMP. A non contrast CT head was obtained and showed Extensive subarachnoid hemorrhage within the suprasellar, prepontine cisterns and extending into the spinal canal superiorly. CT Cervical spine negative for acute fracture or listhesis of the cervical spine. Negative CTA of the head and neck. No intracranial aneurysm or other abnormality to explain the acute subarachnoid hemorrhage. No large vessel occlusion. Neurosurgery was consulted by EDP who recommended repeat imaging. PCCM consulted for admission to the ICU.               Significant Hospital Events   6/9: Admit to stepdown unit with [facility] 6/10: Transfer to [facility]   Consults:  Neurosurgery   Procedures:  None  Significant Diagnostic Tests:  6/9: CT Cervical Spine>No acute fracture or listhesis of the cervical spine. 6/9: Noncontrast CT head>Extensive subarachnoid hemorrhage within the suprasellar, prepontine cisterns and extending into the spinal canal superiorly. CT arteriography is recommended for further evaluation. 6/10: CTA Head and Neck>1. Negative CTA of the head and neck. No intracranial aneurysm or other abnormality to explain the acute subarachnoid hemorrhage. No large vessel occlusion. 2. Atheromatous change about the carotid bifurcations and carotid siphons without hemodynamically significant stenosis. 3. Aortic  Atherosclerosis (ICD10-I70.0) and Emphysema (ICD10-J43.9). 6/10: CT Head w/o contrast>1. No progression of subarachnoid hemorrhage centered at the basal cisterns. 2. No hydrocephalus. Lateral ventricular volume has decreased from yesterday.  Micro Data:  6/9: SARS-CoV-2 PCR>> negative 6/9: Influenza PCR>> negative   Antimicrobials:  None    Physical Examination  GENERAL: 60 year-old critically ill patient lying in the bed with no acute distress. EYES: Pupils equal, round, reactive to light and accommodation. No scleral icterus. Extraocular muscles intact. HEENT: Head atraumatic, normocephalic. Oropharynx and nasopharynx clear. NECK:  Supple, no jugular venous distention. No thyroid enlargement, no tenderness. LUNGS: Normal breath sounds bilaterally, no wheezing, rales,rhonchi or crepitation. No use of accessory muscles of respiration. CARDIOVASCULAR: S1, S2 normal. No murmurs, rubs, or gallops. ABDOMEN: Soft, nontender, nondistended. Bowel sounds present. No organomegaly or mass. EXTREMITIES: No pedal edema, cyanosis, or clubbing. NEUROLOGIC: Cranial nerves II through XII are intact.  Muscle strength 5/5 in all extremities. Sensation intact. Gait not checked. PSYCHIATRIC: The patient is alert and oriented x 3. SKIN: No obvious rash, lesion, or ulcer.   Vitals:   06/21/20 0130 06/21/20 0200 06/21/20 0300 06/21/20 0742  BP: 132/66 (!) 146/66 (!) 148/63 (!) 148/63  Pulse: 64 70  78  Resp: _0 Temp:      TempSrc:      SpO2: 93% 95%  98%  Weight:      Height:         Discharge Labs  BMET Recent Labs  Lab 06/20/20 2325 06/21/20 0748  NA 138 137  K 3.7 3.9  CL 104 104  CO2 24 22  GLUCOSE 120* 111*  BUN 13 10  CREATININE 0.57 0.66  CALCIUM 8.9 8.8*  MG  --  1.9  PHOS  --  3.0    CBC Recent Labs  Lab 06/20/20 2325 06/21/20 0748  HGB 14.6 14.2  HCT 42.8 41.2  WBC 11.0* 11.8*  PLT 329 303    Anti-Coagulation Recent Labs  Lab 06/20/20 2325  INR  1.0          Allergies as of 06/21/2020       Reactions   Penicillins Hives   DID THE REACTION INVOLVE: Swelling of the face/tongue/throat, SOB, or low BP? No Sudden or severe rash/hives, skin peeling, or the inside of the mouth or nose? No Did it require medical treatment? No When did it last happen?      unkn If all above answers are "NO", may proceed with cephalosporin use.        Medication List     STOP taking these medications    amLODipine-benazepril 5-10 MG capsule Commonly known as: LOTREL   azithromycin 250 MG tablet Commonly known as: ZITHROMAX   gabapentin 300 MG capsule Commonly known as: NEURONTIN   pantoprazole 40 MG tablet Commonly known as: PROTONIX Replaced by: pantoprazole 40 MG injection   traMADol 50 MG tablet Commonly known as: ULTRAM   varenicline 0.5 MG X 11 & 1 MG  X 42 tablet Commonly known as: Chantix Starting Month Pak   VITAMIN B 12 PO       TAKE these medications    acetaminophen 10 MG/ML Soln Commonly known as: OFIRMEV Inject 100 mLs (1,000 mg total) into the vein once for 1 dose.   acetaminophen 325 MG tablet Commonly known as: TYLENOL Take 2 tablets (650 mg total) by mouth every 4 (four) hours as needed for mild pain (or temp > 37.5 C (99.5 F)).   acetaminophen 160 MG/5ML solution Commonly known as: TYLENOL Place 20.3 mLs (650 mg total) into feeding tube every 4 (four) hours as needed for mild pain (or temp > 37.5 C (99.5 F)).   acetaminophen 650 MG suppository Commonly known as: TYLENOL Place 1 suppository (650 mg total) rectally every 4 (four) hours as needed for mild pain (or temp > 37.5 C (99.5 F)).   docusate sodium 100 MG capsule Commonly known as: COLACE Take 1 capsule (100 mg total) by mouth 2 (two) times daily as needed for mild constipation.   fentaNYL 100 MCG/2ML injection Commonly known as: SUBLIMAZE Inject 1-2 mLs (50-100 mcg total) into the vein every 4 (four) hours as needed for severe pain.    levETIRAcetam 500 MG/100ML Soln Commonly known as: KEPRRA Inject 100 mLs (500 mg total) into the vein every 12 (twelve) hours.   niMODipine 30 MG capsule Commonly known as: NIMOTOP Take 1 capsule (30 mg total) by mouth every 2 (two) hours.   pantoprazole 40 MG injection Commonly known as: PROTONIX Inject 40 mg into the vein at bedtime. Replaces: pantoprazole 40 MG tablet   polyethylene glycol 17 g packet Commonly known as: MIRALAX / GLYCOLAX Take 17 g by mouth daily as needed for moderate constipation.   senna-docusate 8.6-50 MG tablet Commonly known as: Senokot-S Take 1 tablet by mouth 2 (two) times daily.          Disposition: Stepdown  Discharged Condition: Robby Pirani has met maximum benefit of inpatient care at [facility] and requires transfer to higher level of care for Neurosurgical services.   Time spent on disposition:  50 Minutes.   Signed: Darel Hong, AGACNP-BC [facility] Prefer epic messenger for cross cover needs If after hours, please call E-link

## 2020-06-21 NOTE — ED Notes (Signed)
Called [facility] for transfer per Dr. Rhea Bleacher request spoke to [facility]

## 2020-06-21 NOTE — Progress Notes (Signed)
PT Cancellation Note  Patient Details Name: Tracey Walters MRN: [id number] DOB: 03/16/1960   Cancelled Treatment:    Reason Eval/Treat Not Completed: Medical issues which prohibited therapy (Consult received and chart reviewed. Patient noted with plans for transfer to higher level of care for management of SAH.  Contraindicated for therapy at this time; will complete orders.  Please re-consult as medically appropriate should patient remain in house.)  Tracey Walters H. Owens Shark, PT, DPT, NCS 06/21/20, 8:41 AM [phone]

## 2020-06-22 DIAGNOSIS — G44201 Tension-type headache, unspecified, intractable: Secondary | ICD-10-CM | POA: Diagnosis not present

## 2020-06-22 DIAGNOSIS — I10 Essential (primary) hypertension: Secondary | ICD-10-CM | POA: Diagnosis not present

## 2020-06-22 DIAGNOSIS — W228XXA Striking against or struck by other objects, initial encounter: Secondary | ICD-10-CM | POA: Diagnosis not present

## 2020-06-22 DIAGNOSIS — S066X0A Traumatic subarachnoid hemorrhage without loss of consciousness, initial encounter: Secondary | ICD-10-CM | POA: Diagnosis not present

## 2020-06-22 DIAGNOSIS — M19041 Primary osteoarthritis, right hand: Secondary | ICD-10-CM | POA: Diagnosis not present

## 2020-06-23 DIAGNOSIS — G44201 Tension-type headache, unspecified, intractable: Secondary | ICD-10-CM | POA: Diagnosis not present

## 2020-06-23 DIAGNOSIS — I10 Essential (primary) hypertension: Secondary | ICD-10-CM | POA: Diagnosis not present

## 2020-06-23 DIAGNOSIS — S066X0A Traumatic subarachnoid hemorrhage without loss of consciousness, initial encounter: Secondary | ICD-10-CM | POA: Diagnosis not present

## 2020-06-23 DIAGNOSIS — M19041 Primary osteoarthritis, right hand: Secondary | ICD-10-CM | POA: Diagnosis not present

## 2020-06-24 DIAGNOSIS — W228XXA Striking against or struck by other objects, initial encounter: Secondary | ICD-10-CM | POA: Diagnosis not present

## 2020-06-24 DIAGNOSIS — S066X0A Traumatic subarachnoid hemorrhage without loss of consciousness, initial encounter: Secondary | ICD-10-CM | POA: Diagnosis not present

## 2020-06-25 DIAGNOSIS — S066X1A Traumatic subarachnoid hemorrhage with loss of consciousness of 30 minutes or less, initial encounter: Secondary | ICD-10-CM | POA: Diagnosis not present

## 2020-06-25 DIAGNOSIS — S066X0A Traumatic subarachnoid hemorrhage without loss of consciousness, initial encounter: Secondary | ICD-10-CM | POA: Diagnosis not present

## 2020-06-25 DIAGNOSIS — W228XXA Striking against or struck by other objects, initial encounter: Secondary | ICD-10-CM | POA: Diagnosis not present

## 2020-06-26 DIAGNOSIS — S066X0A Traumatic subarachnoid hemorrhage without loss of consciousness, initial encounter: Secondary | ICD-10-CM | POA: Diagnosis not present

## 2020-06-26 DIAGNOSIS — I609 Nontraumatic subarachnoid hemorrhage, unspecified: Secondary | ICD-10-CM | POA: Diagnosis not present

## 2020-06-27 ENCOUNTER — Other Ambulatory Visit: Payer: Self-pay

## 2020-06-27 DIAGNOSIS — I10 Essential (primary) hypertension: Secondary | ICD-10-CM | POA: Diagnosis not present

## 2020-06-27 DIAGNOSIS — I7300 Raynaud's syndrome without gangrene: Secondary | ICD-10-CM | POA: Diagnosis not present

## 2020-06-27 DIAGNOSIS — Z720 Tobacco use: Secondary | ICD-10-CM | POA: Diagnosis not present

## 2020-06-27 DIAGNOSIS — S066X0A Traumatic subarachnoid hemorrhage without loss of consciousness, initial encounter: Secondary | ICD-10-CM | POA: Diagnosis not present

## 2020-06-27 MED ORDER — POLYETHYLENE GLYCOL 3350 17 G PO PACK: PACK | ORAL | 0 refills | Status: DC

## 2020-06-27 MED ORDER — OXYCODONE HCL 5 MG PO TABS
ORAL_TABLET | ORAL | 0 refills | Status: DC
  Filled 2020-06-27: qty 28, 7d supply, fill #0

## 2020-06-27 MED ORDER — SENNOSIDES 8.6 MG PO TABS: ORAL_TABLET | ORAL | 0 refills | Status: DC

## 2020-06-28 ENCOUNTER — Other Ambulatory Visit: Payer: Self-pay

## 2020-07-01 ENCOUNTER — Other Ambulatory Visit: Payer: Self-pay | Admitting: *Deleted

## 2020-07-01 ENCOUNTER — Encounter: Payer: Self-pay | Admitting: *Deleted

## 2020-07-01 NOTE — Patient Outreach (Signed)
[facility]) Care Management  07/01/2020  Tracey Walters 12-Jun-1960 [id number]  Transition of care call  Referral received: 06/24/20 Initial outreach: 07/01/20  Insurance: [place] [employer]    Subjective: Initial successful telephone call to patient's preferred number in order to complete transition of care assessment; 2 HIPAA identifiers verified. Explained purpose of call and completed transition of care assessment.  Tracey Walters states that she has been better, getter on the better side now.  She reports having some headache relieved with prn tylenol, does not like the way oxycodone makes her feel. She reports some dizziness when first getting up , reinforced fall safety, changing position slowly. She reports having a shower chair for use, encouraged making sure family member in home during her shower. She denies having nausea or vomiting, reports not having a appetite,discussed recent weight loss due to getting adjusted to having new top dental implants. Discussed importance of nutrition, increased protein for healing and strength, reviewed sources of protein, and supplements. 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 She denies bowel or bladder problems.  Patient son is assisting with her recovery.   Reviewed accessing the following [place] Benefits : Discussed ongoing health issues and declines need a referral to one of the [place] chronic disease management programs. She is  a current smoker and has been through quit smart program in the past, she is states not smoking as much now  She is unsure of having  the hospital indemnity plan, provided contact number to [employer] as well as [place] benefits contact number to make contact with.  She uses a [facility] outpatient pharmacy at [place].   Objective:  Per electronic medical record Tracey Walters presented to [facility] hospital 06/30/20  with headache nausea and vomiting after hitting head on counter after bending down to pick up  something from the floor, She was transferred to [facility] Neuro intensive care for Subarachonoid hemmorrahge  6/10-6/16/22 s/p cerebral angiogram negative. Comorbidities include: COPD, Tobacco use, HTN, Raynaunds , HLD.  She  was discharged to home on 06/27/20 without the need for home health services or DME.   Assessment:  Patient voices good understanding of all discharge instructions.  See transition of care flowsheet for assessment details.   Plan:  Reviewed hospital discharge diagnosis of Subarachnoid hemorrhage  and discharge treatment plan using hospital discharge instructions, assessing medication adherence, reviewing problems requiring provider notification, and discussing the importance of follow up with surgeon, primary care provider and/or specialists as directed. Patient questions when she can return to work and who will make that decision.Discussed scheduled neurosurgeon appointment is 7/26, encouraged to call that office to speak with regarding when she can return to work, she agrees to . She also will schedule follow appointment with Dr. Alben Deeds as discharge instructions recommend.   Reviewed [place] healthy lifestyle program information to receive discounted premium for  2023   Step 1: Get  your annual physical  Step 2: Complete your health assessment  Step 3:Identify your current health status and complete the corresponding action step between January 13, 2020 and September 12, 2020.     Patient agreeable to call in the next week for follow up and assessment of further care coordination or resource needs.   Will route successful outreach letter with [place] Management pamphlet and 24 Hour Nurse Line Magnet to [place] Management clinical pool to be mailed to patient's home address.  Thanked patient for their  services to [facility].   Joylene Draft, RN, BSN  [place] Management Coordinator   [phone]- Mobile [phone]- Toll Free Main Office

## 2020-07-08 ENCOUNTER — Other Ambulatory Visit: Payer: Self-pay

## 2020-07-08 ENCOUNTER — Ambulatory Visit: Payer: 59 | Admitting: Internal Medicine

## 2020-07-08 ENCOUNTER — Other Ambulatory Visit: Payer: Self-pay | Admitting: *Deleted

## 2020-07-08 ENCOUNTER — Encounter: Payer: Self-pay | Admitting: Internal Medicine

## 2020-07-08 ENCOUNTER — Other Ambulatory Visit: Payer: Self-pay | Admitting: Internal Medicine

## 2020-07-08 VITALS — BP 143/83 | HR 99 | Ht 63.0 in | Wt 103.4 lb

## 2020-07-08 DIAGNOSIS — F17200 Nicotine dependence, unspecified, uncomplicated: Secondary | ICD-10-CM

## 2020-07-08 DIAGNOSIS — K219 Gastro-esophageal reflux disease without esophagitis: Secondary | ICD-10-CM

## 2020-07-08 DIAGNOSIS — I7300 Raynaud's syndrome without gangrene: Secondary | ICD-10-CM

## 2020-07-08 DIAGNOSIS — I10 Essential (primary) hypertension: Secondary | ICD-10-CM

## 2020-07-08 DIAGNOSIS — I619 Nontraumatic intracerebral hemorrhage, unspecified: Secondary | ICD-10-CM

## 2020-07-08 DIAGNOSIS — J410 Simple chronic bronchitis: Secondary | ICD-10-CM

## 2020-07-08 DIAGNOSIS — S066X1D Traumatic subarachnoid hemorrhage with loss of consciousness of 30 minutes or less, subsequent encounter: Secondary | ICD-10-CM

## 2020-07-08 MED ORDER — AMLODIPINE BESY-BENAZEPRIL HCL 5-10 MG PO CAPS
1.0000 | ORAL_CAPSULE | Freq: Every day | ORAL | 2 refills | Status: DC
  Filled 2020-07-08: qty 90, 90d supply, fill #0

## 2020-07-08 MED ORDER — TRAMADOL HCL 50 MG PO TABS
50.0000 mg | ORAL_TABLET | Freq: Three times a day (TID) | ORAL | 0 refills | Status: DC
  Filled 2020-07-08: qty 90, 30d supply, fill #0

## 2020-07-08 MED ORDER — PANTOPRAZOLE SODIUM 40 MG PO TBEC
40.0000 mg | DELAYED_RELEASE_TABLET | Freq: Every day | ORAL | 3 refills | Status: DC
  Filled 2020-07-08: qty 90, 90d supply, fill #0
  Filled 2020-09-29 – 2020-09-30 (×2): qty 90, 90d supply, fill #1
  Filled 2020-12-27: qty 90, 90d supply, fill #2
  Filled 2021-03-29: qty 90, 90d supply, fill #3

## 2020-07-08 MED ORDER — AMLODIPINE BESY-BENAZEPRIL HCL 5-10 MG PO CAPS
1.0000 | ORAL_CAPSULE | Freq: Every day | ORAL | 3 refills | Status: DC
  Filled 2020-07-08 (×2): qty 90, 90d supply, fill #0
  Filled 2020-09-29 – 2020-09-30 (×2): qty 90, 90d supply, fill #1
  Filled 2020-12-27: qty 90, 90d supply, fill #2
  Filled 2021-03-29: qty 90, 90d supply, fill #3

## 2020-07-08 NOTE — Progress Notes (Addendum)
Established Patient Office Visit  Subjective:  Patient ID: Tracey Walters, female    DOB: [birth year]  Age: 60 y.o. MRN: [id number]  CC:  Chief Complaint  Patient presents with   Hospitalization Follow-up    Patient is here for hospital follow up from Subarachnoid hemorrhage following injury.     HPI  Tracey Walters presents for hospital  follow up  Past Medical History:  Diagnosis Date   Anxiety    Carpal tunnel syndrome    right wrist   Dyspnea    GERD (gastroesophageal reflux disease)    Hyperlipidemia    Hypertension 20 yrs   Raynaud's phenomenon    Rheumatoid arteritis ([place])    Ulcer     Past Surgical History:  Procedure Laterality Date   ABDOMINAL HYSTERECTOMY  1992   BREAST BIOPSY Right 2011   neg   BREAST BIOPSY Right 11/1996   Dense fibrosis,  benign epithelial hyperplasia.   CARPAL TUNNEL RELEASE Right 2012   COLONOSCOPY  2010   ENDOBRONCHIAL ULTRASOUND Right 01/25/2018   Procedure: ENDOBRONCHIAL ULTRASOUND;  Surgeon: Tyler Pita, MD;  Location: [facility] ORS;  Service: Cardiopulmonary;  Laterality: Right;   TONSILLECTOMY     TUBAL LIGATION     UPPER GI ENDOSCOPY      Family History  Problem Relation Age of Onset   Breast cancer Sister 25   Breast cancer Maternal Aunt     Social History   Socioeconomic History   Marital status: Divorced    Spouse name: Not on file   Number of children: Not on file   Years of education: Not on file   Highest education level: Not on file  Occupational History   Not on file  Tobacco Use   Smoking status: Every Day    Packs/day: 2.00    Years: 42.00    Pack years: 84.00    Types: Cigarettes   Smokeless tobacco: Never   Tobacco comments:    currently 2 packs/day  Vaping Use   Vaping Use: Never used  Substance and Sexual Activity   Alcohol use: No   Drug use: No   Sexual activity: Not on file  Other Topics Concern   Not on file  Social History Narrative   Not on file   Social Determinants  of Health   Financial Resource Strain: Not on file  Food Insecurity: Not on file  Transportation Needs: Not on file  Physical Activity: Not on file  Stress: Not on file  Social Connections: Not on file  Intimate Partner Violence: Not on file     Current Outpatient Medications:    acetaminophen (TYLENOL) 325 MG tablet, Take 2 tablets (650 mg total) by mouth every 4 (four) hours as needed for mild pain (or temp > 37.5 C (99.5 F))., Disp: , Rfl:    gabapentin (NEURONTIN) 300 MG capsule, Take 300 mg by mouth daily., Disp: , Rfl:    polyethylene glycol (MIRALAX / GLYCOLAX) 17 g packet, Take 1 packet (17 g total) by mouth once daily as needed for Constipation for up to 14 days Mix in 4-8ounces of fluid prior to taking., Disp: 14 packet, Rfl: 0   amLODipine-benazepril (LOTREL) 5-10 MG capsule, Take 1 capsule by mouth daily., Disp: 90 capsule, Rfl: 3   pantoprazole (PROTONIX) 40 MG tablet, Take 1 tablet (40 mg total) by mouth daily., Disp: 90 tablet, Rfl: 3   traMADol (ULTRAM) 50 MG tablet, Take 1 tablet (50 mg total) by mouth 3 (  three) times daily., Disp: 90 tablet, Rfl: 0   Allergies  Allergen Reactions   Penicillins Hives    DID THE REACTION INVOLVE: Swelling of the face/tongue/throat, SOB, or low BP? No Sudden or severe rash/hives, skin peeling, or the inside of the mouth or nose? No Did it require medical treatment? No When did it last happen?      unkn If all above answers are "NO", may proceed with cephalosporin use.     ROS Review of Systems  Neurological:  Positive for dizziness and weakness. Negative for tremors, seizures and speech difficulty.  Psychiatric/Behavioral:  Positive for sleep disturbance. Negative for agitation, behavioral problems, confusion, dysphoric mood and hallucinations. The patient is nervous/anxious.      Objective:    Physical Exam Vitals reviewed.  Constitutional:      Appearance: Normal appearance.  HENT:     Mouth/Throat:     Mouth: Mucous  membranes are moist.  Eyes:     Pupils: Pupils are equal, round, and reactive to light.  Neck:     Vascular: No carotid bruit.  Cardiovascular:     Rate and Rhythm: Normal rate and regular rhythm.     Pulses: Normal pulses.     Heart sounds: Normal heart sounds.  Pulmonary:     Effort: Pulmonary effort is normal.     Breath sounds: Normal breath sounds.  Abdominal:     General: Bowel sounds are normal.     Palpations: Abdomen is soft. There is no hepatomegaly, splenomegaly or mass.     Tenderness: There is no abdominal tenderness.     Hernia: No hernia is present.  Musculoskeletal:        General: No swelling or tenderness.     Cervical back: Neck supple.     Right lower leg: No edema.     Left lower leg: No edema.  Skin:    Coloration: Skin is not jaundiced.     Findings: No rash.  Neurological:     Mental Status: She is alert and oriented to person, place, and time.     Motor: Weakness present.  Psychiatric:        Mood and Affect: Affect normal.    BP (!) 143/83   Pulse 99   Ht 5\' 3"  (1.6 m)   Wt 103 lb 6.4 oz (46.9 kg)   BMI 18.32 kg/m  Wt Readings from Last 3 Encounters:  07/08/20 103 lb 6.4 oz (46.9 kg)  06/20/20 105 lb (47.6 kg)  04/15/20 105 lb 11.2 oz (47.9 kg)     Health Maintenance Due  Topic Date Due   COVID-19 Vaccine (1) Never done   Pneumococcal Vaccine 64-33 Years old (1 - PCV) Never done   Hepatitis C Screening  Never done   TETANUS/TDAP  Never done   PAP SMEAR-Modifier  Never done   COLONOSCOPY (Pts 45-35yrs Insurance coverage will need to be confirmed)  Never done   Zoster Vaccines- Shingrix (1 of 2) Never done    There are no preventive care reminders to display for this patient.  Lab Results  Component Value Date   TSH 1.27 04/15/2020   Lab Results  Component Value Date   WBC 11.8 (H) 06/21/2020   HGB 14.2 06/21/2020   HCT 41.2 06/21/2020   MCV 95.6 06/21/2020   PLT 303 06/21/2020   Lab Results  Component Value Date   NA 137  06/21/2020   K 3.9 06/21/2020   CO2 22 06/21/2020   GLUCOSE  111 (H) 06/21/2020   BUN 10 06/21/2020   CREATININE 0.66 06/21/2020   BILITOT 0.5 06/20/2020   ALKPHOS 57 06/20/2020   AST 28 06/20/2020   ALT 7 06/20/2020   PROT 7.6 06/20/2020   ALBUMIN 3.8 06/20/2020   CALCIUM 8.8 (L) 06/21/2020   ANIONGAP 11 06/21/2020   Lab Results  Component Value Date   CHOL 253 (H) 04/15/2020   Lab Results  Component Value Date   HDL 60 04/15/2020   Lab Results  Component Value Date   LDLCALC 170 (H) 04/15/2020   Lab Results  Component Value Date   TRIG 108 04/15/2020   Lab Results  Component Value Date   CHOLHDL 4.2 04/15/2020   No results found for: HGBA1C    Assessment & Plan:  1. Essential hypertension Stable at the present time   2. Raynaud's disease without gangrene Renal disease is under control - gabapentin (NEURONTIN) 300 MG capsule; Take 300 mg by mouth daily.  3. Simple chronic bronchitis ([place]) Patient is trying to quit smoking.  4. Sub arachnoid hemorrhage Patient was given tramadol for the headaches - traMADol (ULTRAM) 50 MG tablet; Take 1 tablet (50 mg total) by mouth 3 (three) times daily.  Dispense: 90 tablet; Refill: 0  5. Smoker, patient is trying to quit smoking   6. Gastroesophageal reflux disease without esophagitis Stable  7. Traumatic subarachnoid hemorrhage with loss of consciousness of 30 minutes or less, subsequent encounter   Patient is on disability because of subarachnoid hemorrhage, she will continue to receive physical therapy, she should be considered disabled for her occupation and any gainful employment at the present   Meds ordered this encounter  Medications   DISCONTD: amLODipine-benazepril (LOTREL) 5-10 MG capsule    Sig: Take 1 capsule by mouth daily.    Dispense:  90 capsule    Refill:  2   traMADol (ULTRAM) 50 MG tablet    Sig: Take 1 tablet (50 mg total) by mouth 3 (three) times daily.    Dispense:  90 tablet     Refill:  0  Hypercholesterolemia  I advised the patient to follow Mediterranean diet This diet is rich in fruits vegetables and whole grain, and This diet is also rich in fish and lean meat Patient should also eat a handful of almonds or walnuts daily Recent heart study indicated that average follow-up on this kind of diet reduces the cardiovascular mortality by 50 to 70%==  Follow-up: No follow-ups on file.    Cletis Athens, MD

## 2020-07-09 ENCOUNTER — Other Ambulatory Visit: Payer: Self-pay | Admitting: *Deleted

## 2020-07-09 NOTE — Patient Outreach (Signed)
[facility]) Care Management  07/09/2020  Tracey Walters 1960/02/09 [id number]   Transition of care call/follow up    Referral received: 06/24/20 Initial outreach: 07/01/20 Insurance: [place]  Successful outreach call to patient , she discussed still not feeling great , feeling tired still has headache. She discussed visit with PCP on yesterday and she has been given goal for blood pressure sys 120/, she reports continuing to monitor readings at home and take medication daily she admits taking only as every other day. Patint discussed appetite still not as good, has lost 7 lbs since hospital discharge. Discussed importance of nutrition protein, discussed protein resources and suggestions, declined additional resource information.  Reinforced fall prevention measures, she has her son that is home with her and able to assist as needed.  Patient reports having questions regarding benefits, questions since incident occurred at work should she be on workman comp, referred to her contact benefits office. She voiced having contact number and will call today. Discussed spacing out activities, bill paying, follow up calls, encouraged writing down questions prior to making call to benefits office. Discussed benefit of [employer] , employee assistance counseling she states she has contact number.    Objective  Tracey Walters presented to [facility] hospital 06/30/20  with headache nausea and vomiting after hitting head on counter after bending down to pick up something from the floor, She was transferred to [facility] Neuro intensive care for Subarachonoid hemmorrahge  6/10-6/16/22 s/p cerebral angiogram negative. Comorbidities include: COPD, Tobacco use, HTN, Raynaunds , HLD. She  was discharged to home on 06/27/20 without the need for home health services or DME.   Plan Patient agreeable to call in the next week for follow up and assessment of care coordinatiion  needs.  Reinforced notifying provider for new or worsening symptoms.    Joylene Draft, RN, BSN  [place] Management Coordinator  [phone]- Mobile [phone]- Toll Free Main Office

## 2020-07-10 ENCOUNTER — Other Ambulatory Visit: Payer: Self-pay

## 2020-07-17 ENCOUNTER — Other Ambulatory Visit: Payer: Self-pay | Admitting: *Deleted

## 2020-07-17 NOTE — Patient Outreach (Signed)
[facility]) Care Management  07/17/2020  Tracey Walters 01/06/1961 [id number]   Transition of care call/follow up /Case Closure    Referral received: 06/24/20 Initial outreach: 07/01/20 Insurance: [place]   Subjective  Successful outreach follow up call to patient. She reports feeling much better, appetite and intake improving. She discussed tolerating mobility in home, denies headache, dizziness.  She denies any new concern or care needs at this time, she is agreeable to case closure.   Objective  Tracey Walters presented to [facility] hospital 06/30/20  with headache nausea and vomiting after hitting head on counter after bending down to pick up something from the floor, She was transferred to [facility] intensive care for Subarachonoid hemmorrahge  6/10-6/16/22 s/p cerebral angiogram negative. Comorbidities include: COPD, Tobacco use, HTN, Raynaunds , HLD. She  was discharged to home on 06/27/20 without the need for home health services or DME.   Plan Will close case to [facility] care management, no ongoing care management needs identified.    Joylene Draft, RN, BSN  [place] Management Coordinator  [phone]- Mobile [phone]- Toll Free Main Office

## 2020-07-31 ENCOUNTER — Encounter: Payer: Self-pay | Admitting: Internal Medicine

## 2020-07-31 DIAGNOSIS — S066X1A Traumatic subarachnoid hemorrhage with loss of consciousness of 30 minutes or less, initial encounter: Secondary | ICD-10-CM | POA: Insufficient documentation

## 2020-07-31 DIAGNOSIS — S066X0A Traumatic subarachnoid hemorrhage without loss of consciousness, initial encounter: Secondary | ICD-10-CM | POA: Insufficient documentation

## 2020-07-31 DIAGNOSIS — I619 Nontraumatic intracerebral hemorrhage, unspecified: Secondary | ICD-10-CM | POA: Insufficient documentation

## 2020-07-31 NOTE — Assessment & Plan Note (Addendum)
Patient is on disability because of subarachnoid hemorrhage, she will continue to receive physical therapy, she should be considered disabled for her occupation and any gainful employment at the present time

## 2020-08-06 DIAGNOSIS — Z8679 Personal history of other diseases of the circulatory system: Secondary | ICD-10-CM | POA: Diagnosis not present

## 2020-08-07 ENCOUNTER — Other Ambulatory Visit: Payer: Self-pay

## 2020-08-07 ENCOUNTER — Encounter: Payer: Self-pay | Admitting: Internal Medicine

## 2020-08-07 ENCOUNTER — Ambulatory Visit (INDEPENDENT_AMBULATORY_CARE_PROVIDER_SITE_OTHER): Payer: 59 | Admitting: Internal Medicine

## 2020-08-07 VITALS — BP 107/57 | HR 107 | Ht 63.0 in | Wt 103.7 lb

## 2020-08-07 DIAGNOSIS — I7300 Raynaud's syndrome without gangrene: Secondary | ICD-10-CM

## 2020-08-07 DIAGNOSIS — I619 Nontraumatic intracerebral hemorrhage, unspecified: Secondary | ICD-10-CM

## 2020-08-07 DIAGNOSIS — I10 Essential (primary) hypertension: Secondary | ICD-10-CM

## 2020-08-07 DIAGNOSIS — S066X0D Traumatic subarachnoid hemorrhage without loss of consciousness, subsequent encounter: Secondary | ICD-10-CM | POA: Diagnosis not present

## 2020-08-07 MED ORDER — TRAMADOL HCL 50 MG PO TABS
50.0000 mg | ORAL_TABLET | Freq: Three times a day (TID) | ORAL | 0 refills | Status: DC
  Filled 2020-08-15: qty 90, 30d supply, fill #0

## 2020-08-07 NOTE — Assessment & Plan Note (Signed)
No focal neurological signs on clinical examination, NEUROLOGIC EXAMINATION: The patient is alert, awake and oriented to time, place, location and the reason why he is in the office. Facial sensation intact. Facial motor is intact. Tongue is midline. Uvula elevates symmetrically. Shoulder shrug is intact. Coordination: Finger-to-nose intact. Sensation intact to light touch and temperature. Reflexes 2+ symmetrically. The patient able to ambulate without assistance

## 2020-08-07 NOTE — Progress Notes (Signed)
Established Patient Office Visit  Subjective:  Patient ID: Tracey Walters, female    DOB: 1960/04/26  Age: 60 y.o. MRN: [id number]  CC:  Chief Complaint  Patient presents with   1 month follow up     HPI  [facility] presents for  follow up, she denies any history of headache she can walk she can bend down neck is supple no focal neurological signs were noted.  She has seen a neurologist from [facility].  Her present problem is Loss of memory and the hopefully it is better improve slowly  Past Medical History:  Diagnosis Date   Anxiety    Carpal tunnel syndrome    right wrist   Dyspnea    GERD (gastroesophageal reflux disease)    Hyperlipidemia    Hypertension 20 yrs   Raynaud's phenomenon    Rheumatoid arteritis ([place])    Ulcer     Past Surgical History:  Procedure Laterality Date   ABDOMINAL HYSTERECTOMY  1992   BREAST BIOPSY Right 2011   neg   BREAST BIOPSY Right 11/1996   Dense fibrosis,  benign epithelial hyperplasia.   CARPAL TUNNEL RELEASE Right 2012   COLONOSCOPY  2010   ENDOBRONCHIAL ULTRASOUND Right 01/25/2018   Procedure: ENDOBRONCHIAL ULTRASOUND;  Surgeon: Tyler Pita, MD;  Location: [facility] ORS;  Service: Cardiopulmonary;  Laterality: Right;   TONSILLECTOMY     TUBAL LIGATION     UPPER GI ENDOSCOPY      Family History  Problem Relation Age of Onset   Breast cancer Sister 22   Breast cancer Maternal Aunt     Social History   Socioeconomic History   Marital status: Divorced    Spouse name: Not on file   Number of children: Not on file   Years of education: Not on file   Highest education level: Not on file  Occupational History   Not on file  Tobacco Use   Smoking status: Every Day    Packs/day: 2.00    Years: 42.00    Pack years: 84.00    Types: Cigarettes   Smokeless tobacco: Never   Tobacco comments:    currently 2 packs/day  Vaping Use   Vaping Use: Never used  Substance and Sexual Activity   Alcohol use: No    Drug use: No   Sexual activity: Not on file  Other Topics Concern   Not on file  Social History Narrative   Not on file   Social Determinants of Health   Financial Resource Strain: Not on file  Food Insecurity: Not on file  Transportation Needs: Not on file  Physical Activity: Not on file  Stress: Not on file  Social Connections: Not on file  Intimate Partner Violence: Not on file     Current Outpatient Medications:    acetaminophen (TYLENOL) 325 MG tablet, Take 2 tablets (650 mg total) by mouth every 4 (four) hours as needed for mild pain (or temp > 37.5 C (99.5 F))., Disp: , Rfl:    amLODipine-benazepril (LOTREL) 5-10 MG capsule, Take 1 capsule by mouth daily., Disp: 90 capsule, Rfl: 3   gabapentin (NEURONTIN) 300 MG capsule, Take 300 mg by mouth daily., Disp: , Rfl:    pantoprazole (PROTONIX) 40 MG tablet, Take 1 tablet (40 mg total) by mouth daily., Disp: 90 tablet, Rfl: 3   polyethylene glycol (MIRALAX / GLYCOLAX) 17 g packet, Take 1 packet (17 g total) by mouth once daily as needed for Constipation for  up to 14 days Mix in 4-8ounces of fluid prior to taking., Disp: 14 packet, Rfl: 0   traMADol (ULTRAM) 50 MG tablet, Take 1 tablet (50 mg total) by mouth 3 (three) times daily., Disp: 90 tablet, Rfl: 0   Allergies  Allergen Reactions   Penicillins Hives    DID THE REACTION INVOLVE: Swelling of the face/tongue/throat, SOB, or low BP? No Sudden or severe rash/hives, skin peeling, or the inside of the mouth or nose? No Did it require medical treatment? No When did it last happen?      unkn If all above answers are "NO", may proceed with cephalosporin use.     ROS Review of Systems  Constitutional:  Positive for fatigue. Negative for diaphoresis and fever.  HENT:  Negative for ear pain.   Respiratory:  Negative for chest tightness and shortness of breath.   Cardiovascular:  Negative for chest pain.  Endocrine: Negative for polydipsia.  Genitourinary:  Negative for dysuria  and flank pain.  Neurological:  Positive for dizziness and weakness. Negative for tremors, seizures, speech difficulty, light-headedness, numbness and headaches.  Psychiatric/Behavioral:  Positive for sleep disturbance. Negative for agitation, behavioral problems, confusion, dysphoric mood and hallucinations. The patient is nervous/anxious.      Objective:    Physical Exam Vitals reviewed.  Constitutional:      Appearance: Normal appearance.  HENT:     Mouth/Throat:     Mouth: Mucous membranes are moist.  Eyes:     Pupils: Pupils are equal, round, and reactive to light.  Neck:     Vascular: No carotid bruit.  Cardiovascular:     Rate and Rhythm: Normal rate and regular rhythm.     Pulses: Normal pulses.     Heart sounds: Normal heart sounds.  Pulmonary:     Effort: Pulmonary effort is normal.     Breath sounds: Normal breath sounds.  Abdominal:     General: Bowel sounds are normal.     Palpations: Abdomen is soft. There is no hepatomegaly, splenomegaly or mass.     Tenderness: There is no abdominal tenderness.     Hernia: No hernia is present.  Musculoskeletal:        General: No swelling or tenderness.     Cervical back: Neck supple.     Right lower leg: No edema.     Left lower leg: No edema.  Skin:    Coloration: Skin is not jaundiced.     Findings: No rash.  Neurological:     Mental Status: She is alert and oriented to person, place, and time.     Motor: Weakness present.  Psychiatric:        Mood and Affect: Affect normal.    BP (!) 107/57   Pulse (!) 107   Ht '5\' 3"'$  (1.6 m)   Wt 103 lb 11.2 oz (47 kg)   BMI 18.37 kg/m  Wt Readings from Last 3 Encounters:  08/07/20 103 lb 11.2 oz (47 kg)  07/08/20 103 lb 6.4 oz (46.9 kg)  06/20/20 105 lb (47.6 kg)     Health Maintenance Due  Topic Date Due   COVID-19 Vaccine (1) Never done   Pneumococcal Vaccine 51-59 Years old (1 - PCV) Never done   Hepatitis C Screening  Never done   TETANUS/TDAP  Never done   PAP  SMEAR-Modifier  Never done   COLONOSCOPY (Pts 45-73yr Insurance coverage will need to be confirmed)  Never done   Zoster Vaccines- Shingrix (1 of  2) Never done    There are no preventive care reminders to display for this patient.  Lab Results  Component Value Date   TSH 1.27 04/15/2020   Lab Results  Component Value Date   WBC 11.8 (H) 06/21/2020   HGB 14.2 06/21/2020   HCT 41.2 06/21/2020   MCV 95.6 06/21/2020   PLT 303 06/21/2020   Lab Results  Component Value Date   NA 137 06/21/2020   K 3.9 06/21/2020   CO2 22 06/21/2020   GLUCOSE 111 (H) 06/21/2020   BUN 10 06/21/2020   CREATININE 0.66 06/21/2020   BILITOT 0.5 06/20/2020   ALKPHOS 57 06/20/2020   AST 28 06/20/2020   ALT 7 06/20/2020   PROT 7.6 06/20/2020   ALBUMIN 3.8 06/20/2020   CALCIUM 8.8 (L) 06/21/2020   ANIONGAP 11 06/21/2020   Lab Results  Component Value Date   CHOL 253 (H) 04/15/2020   Lab Results  Component Value Date   HDL 60 04/15/2020   Lab Results  Component Value Date   LDLCALC 170 (H) 04/15/2020   Lab Results  Component Value Date   TRIG 108 04/15/2020   Lab Results  Component Value Date   CHOLHDL 4.2 04/15/2020   No results found for: HGBA1C    Assessment & Plan:  1. Essential hypertension Stable at the present time   2. Raynaud's disease without gangrene Renal disease is under control - gabapentin (NEURONTIN) 300 MG capsule; Take 300 mg by mouth daily.  3. Simple chronic bronchitis ([place]) Patient is trying to quit smoking.  4. Sub arachnoid hemorrhage Patient was given tramadol for the headaches - traMADol (ULTRAM) 50 MG tablet; Take 1 tablet (50 mg total) by mouth 3 (three) times daily.  Dispense: 90 tablet; Refill: 0  5. Smoker, patient is trying to quit smoking   6. Gastroesophageal reflux disease without esophagitis Stable  7. Traumatic subarachnoid hemorrhage with loss of consciousness of 30 minutes or less, subsequent encounter   Patient is on disability  because of subarachnoid hemorrhage, she will continue to receive physical therapy, she should be considered disabled for her occupation and any gainful employment at the present   Meds ordered this encounter  Medications   traMADol (ULTRAM) 50 MG tablet    Sig: Take 1 tablet (50 mg total) by mouth 3 (three) times daily.    Dispense:  90 tablet    Refill:  0  Hypercholesterolemia  I advised the patient to follow Mediterranean diet This diet is rich in fruits vegetables and whole grain, and This diet is also rich in fish and lean meat Patient should also eat a handful of almonds or walnuts daily Recent heart study indicated that average follow-up on this kind of diet reduces the cardiovascular mortality by 50 to 70%==  Follow-up: No follow-ups on file.    Cletis Athens, MD

## 2020-08-07 NOTE — Assessment & Plan Note (Signed)
Stable

## 2020-08-07 NOTE — Assessment & Plan Note (Signed)

## 2020-08-12 ENCOUNTER — Ambulatory Visit: Payer: 59 | Admitting: Internal Medicine

## 2020-08-15 ENCOUNTER — Other Ambulatory Visit: Payer: Self-pay

## 2020-08-15 ENCOUNTER — Telehealth: Payer: Self-pay

## 2020-08-15 NOTE — Telephone Encounter (Signed)
Received medical record request from [facility]. Request has been faxed to medical records.

## 2020-09-02 ENCOUNTER — Other Ambulatory Visit: Payer: Self-pay

## 2020-09-02 ENCOUNTER — Encounter: Payer: Self-pay | Admitting: Internal Medicine

## 2020-09-02 ENCOUNTER — Ambulatory Visit (INDEPENDENT_AMBULATORY_CARE_PROVIDER_SITE_OTHER): Payer: 59 | Admitting: Internal Medicine

## 2020-09-02 VITALS — BP 94/62 | HR 88 | Ht 63.0 in | Wt 105.6 lb

## 2020-09-02 DIAGNOSIS — F17200 Nicotine dependence, unspecified, uncomplicated: Secondary | ICD-10-CM | POA: Diagnosis not present

## 2020-09-02 DIAGNOSIS — I10 Essential (primary) hypertension: Secondary | ICD-10-CM | POA: Diagnosis not present

## 2020-09-02 DIAGNOSIS — S066X0D Traumatic subarachnoid hemorrhage without loss of consciousness, subsequent encounter: Secondary | ICD-10-CM | POA: Diagnosis not present

## 2020-09-02 MED ORDER — TRAMADOL HCL 50 MG PO TABS
50.0000 mg | ORAL_TABLET | Freq: Three times a day (TID) | ORAL | 0 refills | Status: DC
  Filled 2020-09-23: qty 90, 30d supply, fill #0

## 2020-09-02 NOTE — Assessment & Plan Note (Signed)
Blood pressure is under control 

## 2020-09-02 NOTE — Progress Notes (Addendum)
Established Patient Office Visit  Subjective:  Patient ID: Tracey Walters, female    DOB: 11/03/1960  Age: 60 y.o. MRN: [id number]  CC:  Chief Complaint  Patient presents with   Follow-up    HPI  Tracey Walters presents for check up  Past Medical History:  Diagnosis Date   Anxiety    Carpal tunnel syndrome    right wrist   Dyspnea    GERD (gastroesophageal reflux disease)    Hyperlipidemia    Hypertension 20 yrs   Raynaud's phenomenon    Rheumatoid arteritis ([place])    Ulcer     Past Surgical History:  Procedure Laterality Date   ABDOMINAL HYSTERECTOMY  1992   BREAST BIOPSY Right 2011   neg   BREAST BIOPSY Right 11/1996   Dense fibrosis,  benign epithelial hyperplasia.   CARPAL TUNNEL RELEASE Right 2012   COLONOSCOPY  2010   ENDOBRONCHIAL ULTRASOUND Right 01/25/2018   Procedure: ENDOBRONCHIAL ULTRASOUND;  Surgeon: Tyler Pita, MD;  Location: [facility] ORS;  Service: Cardiopulmonary;  Laterality: Right;   TONSILLECTOMY     TUBAL LIGATION     UPPER GI ENDOSCOPY      Family History  Problem Relation Age of Onset   Breast cancer Sister 29   Breast cancer Maternal Aunt     Social History   Socioeconomic History   Marital status: Divorced    Spouse name: Not on file   Number of children: Not on file   Years of education: Not on file   Highest education level: Not on file  Occupational History   Not on file  Tobacco Use   Smoking status: Every Day    Packs/day: 2.00    Years: 42.00    Pack years: 84.00    Types: Cigarettes   Smokeless tobacco: Never   Tobacco comments:    currently 2 packs/day  Vaping Use   Vaping Use: Never used  Substance and Sexual Activity   Alcohol use: No   Drug use: No   Sexual activity: Not on file  Other Topics Concern   Not on file  Social History Narrative   Not on file   Social Determinants of Health   Financial Resource Strain: Not on file  Food Insecurity: Not on file  Transportation Needs: Not on file   Physical Activity: Not on file  Stress: Not on file  Social Connections: Not on file  Intimate Partner Violence: Not on file     Current Outpatient Medications:    amLODipine-benazepril (LOTREL) 5-10 MG capsule, Take 1 capsule by mouth daily., Disp: 90 capsule, Rfl: 3   gabapentin (NEURONTIN) 300 MG capsule, Take 300 mg by mouth daily., Disp: , Rfl:    pantoprazole (PROTONIX) 40 MG tablet, Take 1 tablet (40 mg total) by mouth daily., Disp: 90 tablet, Rfl: 3   acetaminophen (TYLENOL) 325 MG tablet, Take 2 tablets (650 mg total) by mouth every 4 (four) hours as needed for mild pain (or temp > 37.5 C (99.5 F))., Disp: , Rfl:    traMADol (ULTRAM) 50 MG tablet, Take 1 tablet (50 mg total) by mouth 3 (three) times daily., Disp: 90 tablet, Rfl: 0   Allergies  Allergen Reactions   Penicillins Hives    DID THE REACTION INVOLVE: Swelling of the face/tongue/throat, SOB, or low BP? No Sudden or severe rash/hives, skin peeling, or the inside of the mouth or nose? No Did it require medical treatment? No When did it last happen?  unkn If all above answers are "NO", may proceed with cephalosporin use.     ROS Review of Systems  Constitutional: Negative.   HENT: Negative.    Eyes: Negative.   Respiratory: Negative.    Cardiovascular: Negative.  Negative for chest pain.  Gastrointestinal: Negative.   Endocrine: Negative.   Genitourinary: Negative.   Musculoskeletal: Negative.   Skin: Negative.   Allergic/Immunologic: Negative.   Neurological: Negative.  Negative for dizziness, tremors, seizures, speech difficulty, light-headedness and headaches.  Hematological: Negative.   Psychiatric/Behavioral: Negative.    All other systems reviewed and are negative.    Objective:    Physical Exam Vitals reviewed.  Constitutional:      Appearance: Normal appearance.  HENT:     Mouth/Throat:     Mouth: Mucous membranes are moist.  Eyes:     Pupils: Pupils are equal, round, and reactive to  light.  Neck:     Vascular: No carotid bruit.  Cardiovascular:     Rate and Rhythm: Normal rate and regular rhythm.     Pulses: Normal pulses.     Heart sounds: Normal heart sounds.  Pulmonary:     Effort: Pulmonary effort is normal.     Breath sounds: Normal breath sounds.  Abdominal:     General: Bowel sounds are normal.     Palpations: Abdomen is soft. There is no hepatomegaly, splenomegaly or mass.     Tenderness: There is no abdominal tenderness.     Hernia: No hernia is present.  Musculoskeletal:        General: No tenderness.     Cervical back: Neck supple.     Right lower leg: No edema.     Left lower leg: No edema.  Skin:    Findings: No rash.  Neurological:     General: No focal deficit present.     Mental Status: She is alert and oriented to person, place, and time.     Motor: No weakness.  Psychiatric:        Mood and Affect: Mood and affect normal.        Behavior: Behavior normal.    BP 94/62   Pulse 88   Ht '5\' 3"'$  (1.6 m)   Wt 105 lb 9.6 oz (47.9 kg)   BMI 18.71 kg/m  Wt Readings from Last 3 Encounters:  09/02/20 105 lb 9.6 oz (47.9 kg)  08/07/20 103 lb 11.2 oz (47 kg)  07/08/20 103 lb 6.4 oz (46.9 kg)     Health Maintenance Due  Topic Date Due   COVID-19 Vaccine (1) Never done   Pneumococcal Vaccine 73-19 Years old (1 - PCV) Never done   Hepatitis C Screening  Never done   TETANUS/TDAP  Never done   PAP SMEAR-Modifier  Never done   COLONOSCOPY (Pts 45-10yr Insurance coverage will need to be confirmed)  Never done   Zoster Vaccines- Shingrix (1 of 2) Never done   INFLUENZA VACCINE  08/12/2020    There are no preventive care reminders to display for this patient.  Lab Results  Component Value Date   TSH 1.27 04/15/2020   Lab Results  Component Value Date   WBC 11.8 (H) 06/21/2020   HGB 14.2 06/21/2020   HCT 41.2 06/21/2020   MCV 95.6 06/21/2020   PLT 303 06/21/2020   Lab Results  Component Value Date   NA 137 06/21/2020   K 3.9  06/21/2020   CO2 22 06/21/2020   GLUCOSE 111 (H) 06/21/2020  BUN 10 06/21/2020   CREATININE 0.66 06/21/2020   BILITOT 0.5 06/20/2020   ALKPHOS 57 06/20/2020   AST 28 06/20/2020   ALT 7 06/20/2020   PROT 7.6 06/20/2020   ALBUMIN 3.8 06/20/2020   CALCIUM 8.8 (L) 06/21/2020   ANIONGAP 11 06/21/2020   Lab Results  Component Value Date   CHOL 253 (H) 04/15/2020   Lab Results  Component Value Date   HDL 60 04/15/2020   Lab Results  Component Value Date   LDLCALC 170 (H) 04/15/2020   Lab Results  Component Value Date   TRIG 108 04/15/2020   Lab Results  Component Value Date   CHOLHDL 4.2 04/15/2020   No results found for: HGBA1C    Assessment & Plan:   Problem List Items Addressed This Visit       Cardiovascular and Mediastinum   Essential hypertension    Blood pressure is under control        Nervous and Auditory   Subarachnoid hemorrhage following injury, no loss of consciousness ([place]) - Primary    NEUROLOGIC EXAMINATION: The patient is alert, awake and oriented to time, place, location . Facial sensation intact. Facial motor is intact. Tongue is midline. Uvula elevates symmetrically. Shoulder shrug is intact. Coordination: Finger-to-nose intact. Sensation intact to light touch and temperature. Reflexes 2+ symmetrically. The patient able to ambulate without assistance      Relevant Medications   traMADol (ULTRAM) 50 MG tablet     Other   Smoker    - I instructed the patient to stop smoking and provided them with smoking cessation materials.  - I informed the patient that smoking puts them at increased risk for cancer, COPD, hypertension, and more.  - Informed the patient to seek help if they begin to have trouble breathing, develop chest pain, start to cough up blood, feel faint, or pass out.       Meds ordered this encounter  Medications   traMADol (ULTRAM) 50 MG tablet    Sig: Take 1 tablet (50 mg total) by mouth 3 (three) times daily.    Dispense:   90 tablet    Refill:  0    Fill on 09/15/2020   Patient is released to go back to work Follow-up: No follow-ups on file.    Cletis Athens, MD

## 2020-09-02 NOTE — Assessment & Plan Note (Signed)
NEUROLOGIC EXAMINATION: The patient is alert, awake and oriented to time, place, location . Facial sensation intact. Facial motor is intact. Tongue is midline. Uvula elevates symmetrically. Shoulder shrug is intact. Coordination: Finger-to-nose intact. Sensation intact to light touch and temperature. Reflexes 2+ symmetrically. The patient able to ambulate without assistance

## 2020-09-02 NOTE — Assessment & Plan Note (Signed)
-   I instructed the patient to stop smoking and provided them with smoking cessation materials.  - I informed the patient that smoking puts them at increased risk for cancer, COPD, hypertension, and more.  - Informed the patient to seek help if they begin to have trouble breathing, develop chest pain, start to cough up blood, feel faint, or pass out.  

## 2020-09-02 NOTE — Addendum Note (Signed)
Addended by: Cletis Athens on: 09/02/2020 11:29 AM   Modules accepted: Orders

## 2020-09-02 NOTE — Assessment & Plan Note (Signed)
Patient is not having any headache, neurologic examination is normal, gait is steady, Romberg is negative

## 2020-09-23 ENCOUNTER — Other Ambulatory Visit: Payer: Self-pay

## 2020-09-30 ENCOUNTER — Other Ambulatory Visit: Payer: Self-pay

## 2020-10-23 ENCOUNTER — Other Ambulatory Visit: Payer: Self-pay | Admitting: General Surgery

## 2020-10-23 DIAGNOSIS — Z1239 Encounter for other screening for malignant neoplasm of breast: Secondary | ICD-10-CM

## 2020-10-28 ENCOUNTER — Ambulatory Visit (INDEPENDENT_AMBULATORY_CARE_PROVIDER_SITE_OTHER): Payer: 59 | Admitting: Internal Medicine

## 2020-10-28 ENCOUNTER — Encounter: Payer: Self-pay | Admitting: Internal Medicine

## 2020-10-28 ENCOUNTER — Other Ambulatory Visit: Payer: Self-pay

## 2020-10-28 VITALS — BP 108/63 | HR 85 | Ht 63.0 in | Wt 103.4 lb

## 2020-10-28 DIAGNOSIS — S066X1D Traumatic subarachnoid hemorrhage with loss of consciousness of 30 minutes or less, subsequent encounter: Secondary | ICD-10-CM

## 2020-10-28 DIAGNOSIS — M791 Myalgia, unspecified site: Secondary | ICD-10-CM | POA: Diagnosis not present

## 2020-10-28 DIAGNOSIS — F17200 Nicotine dependence, unspecified, uncomplicated: Secondary | ICD-10-CM | POA: Diagnosis not present

## 2020-10-28 DIAGNOSIS — Z23 Encounter for immunization: Secondary | ICD-10-CM | POA: Diagnosis not present

## 2020-10-28 DIAGNOSIS — S066X0D Traumatic subarachnoid hemorrhage without loss of consciousness, subsequent encounter: Secondary | ICD-10-CM

## 2020-10-28 MED ORDER — TRAMADOL HCL 50 MG PO TABS: 50.0000 mg | ORAL_TABLET | Freq: Three times a day (TID) | ORAL | 0 refills | Status: DC

## 2020-10-28 NOTE — Assessment & Plan Note (Signed)
-   I instructed the patient to stop smoking and provided them with smoking cessation materials.  - I informed the patient that smoking puts them at increased risk for cancer, COPD, hypertension, and more.  - Informed the patient to seek help if they begin to have trouble breathing, develop chest pain, start to cough up blood, feel faint, or pass out.  

## 2020-10-28 NOTE — Assessment & Plan Note (Signed)
Stable without

## 2020-10-28 NOTE — Progress Notes (Signed)
Established Patient Office Visit  Subjective:  Patient ID: Tracey Walters, female    DOB: 11-12-1960  Age: 60 y.o. MRN: [id number]  CC:  Chief Complaint  Patient presents with   Medication Refill    Medication Refill   Tracey Walters presents for patient has a history of concussion, now she is forgetting to do the tasks as required, complaining of decrease in the memory and difficulty in interpreting the problems Past Medical History:  Diagnosis Date   Anxiety    Carpal tunnel syndrome    right wrist   Dyspnea    GERD (gastroesophageal reflux disease)    Hyperlipidemia    Hypertension 20 yrs   Raynaud's phenomenon    Rheumatoid arteritis ([place])    Ulcer     Past Surgical History:  Procedure Laterality Date   ABDOMINAL HYSTERECTOMY  1992   BREAST BIOPSY Right 2011   neg   BREAST BIOPSY Right 11/1996   Dense fibrosis,  benign epithelial hyperplasia.   CARPAL TUNNEL RELEASE Right 2012   COLONOSCOPY  2010   ENDOBRONCHIAL ULTRASOUND Right 01/25/2018   Procedure: ENDOBRONCHIAL ULTRASOUND;  Surgeon: Tyler Pita, MD;  Location: [facility] ORS;  Service: Cardiopulmonary;  Laterality: Right;   TONSILLECTOMY     TUBAL LIGATION     UPPER GI ENDOSCOPY      Family History  Problem Relation Age of Onset   Breast cancer Sister 3   Breast cancer Maternal Aunt     Social History   Socioeconomic History   Marital status: Divorced    Spouse name: Not on file   Number of children: Not on file   Years of education: Not on file   Highest education level: Not on file  Occupational History   Not on file  Tobacco Use   Smoking status: Every Day    Packs/day: 2.00    Years: 42.00    Pack years: 84.00    Types: Cigarettes   Smokeless tobacco: Never   Tobacco comments:    currently 2 packs/day  Vaping Use   Vaping Use: Never used  Substance and Sexual Activity   Alcohol use: No   Drug use: No   Sexual activity: Not on file  Other Topics Concern   Not on file   Social History Narrative   Not on file   Social Determinants of Health   Financial Resource Strain: Not on file  Food Insecurity: Not on file  Transportation Needs: Not on file  Physical Activity: Not on file  Stress: Not on file  Social Connections: Not on file  Intimate Partner Violence: Not on file     Current Outpatient Medications:    amLODipine-benazepril (LOTREL) 5-10 MG capsule, Take 1 capsule by mouth daily., Disp: 90 capsule, Rfl: 3   gabapentin (NEURONTIN) 300 MG capsule, Take 300 mg by mouth daily., Disp: , Rfl:    pantoprazole (PROTONIX) 40 MG tablet, Take 1 tablet (40 mg total) by mouth daily., Disp: 90 tablet, Rfl: 3   traMADol (ULTRAM) 50 MG tablet, Take 1 tablet (50 mg total) by mouth 3 (three) times daily., Disp: 90 tablet, Rfl: 0   Allergies  Allergen Reactions   Penicillins Hives    DID THE REACTION INVOLVE: Swelling of the face/tongue/throat, SOB, or low BP? No Sudden or severe rash/hives, skin peeling, or the inside of the mouth or nose? No Did it require medical treatment? No When did it last happen?      unkn If all  above answers are "NO", may proceed with cephalosporin use.     ROS Review of Systems  Constitutional: Negative.   HENT: Negative.    Eyes: Negative.   Respiratory: Negative.    Cardiovascular: Negative.   Gastrointestinal: Negative.   Endocrine: Negative.   Genitourinary: Negative.   Musculoskeletal: Negative.   Skin: Negative.   Allergic/Immunologic: Negative.   Neurological: Negative.   Hematological: Negative.   Psychiatric/Behavioral: Negative.    All other systems reviewed and are negative.    Objective:    Physical Exam Vitals reviewed.  Constitutional:      Appearance: Normal appearance.  HENT:     Mouth/Throat:     Mouth: Mucous membranes are moist.  Eyes:     Pupils: Pupils are equal, round, and reactive to light.  Neck:     Vascular: No carotid bruit.  Cardiovascular:     Rate and Rhythm: Normal rate and  regular rhythm.     Pulses: Normal pulses.     Heart sounds: Normal heart sounds.  Pulmonary:     Effort: Pulmonary effort is normal.     Breath sounds: Normal breath sounds.  Abdominal:     General: Bowel sounds are normal.     Palpations: Abdomen is soft. There is no hepatomegaly, splenomegaly or mass.     Tenderness: There is no abdominal tenderness.     Hernia: No hernia is present.  Musculoskeletal:        General: No tenderness.     Cervical back: Neck supple.     Right lower leg: No edema.     Left lower leg: No edema.  Skin:    Findings: No rash.  Neurological:     General: No focal deficit present.     Mental Status: She is alert.     Cranial Nerves: No cranial nerve deficit.     Motor: No weakness.     Gait: Gait normal.  Psychiatric:        Mood and Affect: Mood and affect normal.        Behavior: Behavior normal.     Comments: Memory is impaired Trouble doing simple tasks and solving small problems    BP 108/63   Pulse 85   Ht 5\' 3"  (1.6 m)   Wt 103 lb 6.4 oz (46.9 kg)   BMI 18.32 kg/m  Wt Readings from Last 3 Encounters:  10/28/20 103 lb 6.4 oz (46.9 kg)  09/02/20 105 lb 9.6 oz (47.9 kg)  08/07/20 103 lb 11.2 oz (47 kg)     Health Maintenance Due  Topic Date Due   Hepatitis C Screening  Never done   PAP SMEAR-Modifier  Never done   COLONOSCOPY (Pts 45-60yrs Insurance coverage will need to be confirmed)  Never done   Zoster Vaccines- Shingrix (1 of 2) Never done   COVID-19 Vaccine (4 - Booster for Pfizer series) 03/16/2020    There are no preventive care reminders to display for this patient.  Lab Results  Component Value Date   TSH 1.27 04/15/2020   Lab Results  Component Value Date   WBC 11.8 (H) 06/21/2020   HGB 14.2 06/21/2020   HCT 41.2 06/21/2020   MCV 95.6 06/21/2020   PLT 303 06/21/2020   Lab Results  Component Value Date   NA 137 06/21/2020   K 3.9 06/21/2020   CO2 22 06/21/2020   GLUCOSE 111 (H) 06/21/2020   BUN 10  06/21/2020   CREATININE 0.66 06/21/2020   BILITOT  0.5 06/20/2020   ALKPHOS 57 06/20/2020   AST 28 06/20/2020   ALT 7 06/20/2020   PROT 7.6 06/20/2020   ALBUMIN 3.8 06/20/2020   CALCIUM 8.8 (L) 06/21/2020   ANIONGAP 11 06/21/2020   Lab Results  Component Value Date   CHOL 253 (H) 04/15/2020   Lab Results  Component Value Date   HDL 60 04/15/2020   Lab Results  Component Value Date   LDLCALC 170 (H) 04/15/2020   Lab Results  Component Value Date   TRIG 108 04/15/2020   Lab Results  Component Value Date   CHOLHDL 4.2 04/15/2020   No results found for: HGBA1C    Assessment & Plan:   Problem List Items Addressed This Visit       Cardiovascular and Mediastinum   Traumatic subarachnoid hemorrhage with loss of consciousness of 30 minutes or less ([place])    Patient has trouble swallowing small problems will refer to neurology for evaluation        Nervous and Auditory   Subarachnoid hemorrhage following injury, no loss of consciousness (HCC)    There is no headache no neurological signs were noted suggestive of any weakness      Relevant Medications   traMADol (ULTRAM) 50 MG tablet     Other   Smoker    - I instructed the patient to stop smoking and provided them with smoking cessation materials.  - I informed the patient that smoking puts them at increased risk for cancer, COPD, hypertension, and more.  - Informed the patient to seek help if they begin to have trouble breathing, develop chest pain, start to cough up blood, feel faint, or pass out.      Myalgia    Stable without      Other Visit Diagnoses     Need for Tdap vaccination    -  Primary   Relevant Orders   Tdap vaccine greater than or equal to 7yo IM (Completed)       Meds ordered this encounter  Medications   traMADol (ULTRAM) 50 MG tablet    Sig: Take 1 tablet (50 mg total) by mouth 3 (three) times daily.    Dispense:  90 tablet    Refill:  0    Fill on 09/15/2020    Follow-up: No  follow-ups on file.    Cletis Athens, MD

## 2020-10-28 NOTE — Assessment & Plan Note (Signed)
There is no headache no neurological signs were noted suggestive of any weakness

## 2020-10-28 NOTE — Assessment & Plan Note (Signed)
Patient has trouble swallowing small problems will refer to neurology for evaluation

## 2020-10-29 ENCOUNTER — Ambulatory Visit: Payer: 59 | Admitting: Internal Medicine

## 2020-11-04 ENCOUNTER — Other Ambulatory Visit: Payer: Self-pay

## 2020-11-04 MED ORDER — TRAMADOL HCL 50 MG PO TABS
ORAL_TABLET | ORAL | 0 refills | Status: DC
  Filled 2020-11-04: qty 90, 30d supply, fill #0

## 2020-11-12 ENCOUNTER — Ambulatory Visit: Payer: 59 | Admitting: Internal Medicine

## 2020-12-02 ENCOUNTER — Encounter: Payer: Self-pay | Admitting: Internal Medicine

## 2020-12-02 ENCOUNTER — Ambulatory Visit
Admission: RE | Admit: 2020-12-02 | Discharge: 2020-12-02 | Disposition: A | Discharge status: Home / Self Care | Payer: 59 | Source: Ambulatory Visit | Attending: General Surgery | Admitting: General Surgery

## 2020-12-02 ENCOUNTER — Other Ambulatory Visit: Payer: Self-pay

## 2020-12-02 DIAGNOSIS — Z1231 Encounter for screening mammogram for malignant neoplasm of breast: Secondary | ICD-10-CM | POA: Diagnosis not present

## 2020-12-02 DIAGNOSIS — Z1239 Encounter for other screening for malignant neoplasm of breast: Secondary | ICD-10-CM

## 2020-12-10 DIAGNOSIS — Z1239 Encounter for other screening for malignant neoplasm of breast: Secondary | ICD-10-CM | POA: Diagnosis not present

## 2020-12-10 DIAGNOSIS — Z1211 Encounter for screening for malignant neoplasm of colon: Secondary | ICD-10-CM | POA: Diagnosis not present

## 2020-12-22 DIAGNOSIS — Z1211 Encounter for screening for malignant neoplasm of colon: Secondary | ICD-10-CM | POA: Diagnosis not present

## 2020-12-27 ENCOUNTER — Other Ambulatory Visit: Payer: Self-pay

## 2020-12-29 LAB — COLOGUARD: COLOGUARD: NEGATIVE

## 2020-12-29 LAB — EXTERNAL GENERIC LAB PROCEDURE: COLOGUARD: NEGATIVE

## 2021-01-01 ENCOUNTER — Other Ambulatory Visit: Payer: Self-pay

## 2021-01-01 MED ORDER — TRAMADOL HCL 50 MG PO TABS
ORAL_TABLET | ORAL | 0 refills | Status: DC
  Filled 2021-01-01: qty 90, 30d supply, fill #0

## 2021-01-22 ENCOUNTER — Other Ambulatory Visit: Payer: Self-pay | Admitting: *Deleted

## 2021-01-22 DIAGNOSIS — F17210 Nicotine dependence, cigarettes, uncomplicated: Secondary | ICD-10-CM

## 2021-01-22 DIAGNOSIS — Z87891 Personal history of nicotine dependence: Secondary | ICD-10-CM

## 2021-01-24 DIAGNOSIS — H5203 Hypermetropia, bilateral: Secondary | ICD-10-CM | POA: Diagnosis not present

## 2021-01-27 ENCOUNTER — Other Ambulatory Visit: Payer: Self-pay

## 2021-01-27 ENCOUNTER — Encounter: Payer: Self-pay | Admitting: Internal Medicine

## 2021-01-27 ENCOUNTER — Ambulatory Visit (INDEPENDENT_AMBULATORY_CARE_PROVIDER_SITE_OTHER): Payer: 59 | Admitting: Internal Medicine

## 2021-01-27 VITALS — BP 112/54 | HR 72 | Ht 63.0 in | Wt 111.7 lb

## 2021-01-27 DIAGNOSIS — S066X1D Traumatic subarachnoid hemorrhage with loss of consciousness of 30 minutes or less, subsequent encounter: Secondary | ICD-10-CM

## 2021-01-27 DIAGNOSIS — I10 Essential (primary) hypertension: Secondary | ICD-10-CM

## 2021-01-27 DIAGNOSIS — M791 Myalgia, unspecified site: Secondary | ICD-10-CM

## 2021-01-27 DIAGNOSIS — Z87891 Personal history of nicotine dependence: Secondary | ICD-10-CM

## 2021-01-27 MED ORDER — TRAMADOL HCL 50 MG PO TABS: ORAL_TABLET | ORAL | 0 refills | Status: DC

## 2021-01-27 NOTE — Assessment & Plan Note (Signed)
Stable at the present time. 

## 2021-01-27 NOTE — Assessment & Plan Note (Signed)
-   I instructed the patient to stop smoking and provided them with smoking cessation materials.  - I informed the patient that smoking puts them at increased risk for cancer, COPD, hypertension, and more.  - Informed the patient to seek help if they begin to have trouble breathing, develop chest pain, start to cough up blood, feel faint, or pass out.  Patient was advised to have a CT scan done

## 2021-01-27 NOTE — Assessment & Plan Note (Signed)
Under control on medication. 

## 2021-01-27 NOTE — Progress Notes (Signed)
Established Patient Office Visit  Subjective:  Patient ID: Tracey Walters, female    DOB: 02-27-1960  Age: 61 y.o. MRN: [id number]  CC:  Chief Complaint  Patient presents with   Medication Refill    HPI  Tracey Walters presents for checkup.  Past Medical History:  Diagnosis Date   Anxiety    Carpal tunnel syndrome    right wrist   Dyspnea    GERD (gastroesophageal reflux disease)    Hyperlipidemia    Hypertension 20 yrs   Raynaud's phenomenon    Rheumatoid arteritis ([place])    Ulcer     Past Surgical History:  Procedure Laterality Date   ABDOMINAL HYSTERECTOMY  1992   BREAST BIOPSY Right 2011   neg   BREAST BIOPSY Right 11/1996   Dense fibrosis,  benign epithelial hyperplasia.   CARPAL TUNNEL RELEASE Right 2012   COLONOSCOPY  2010   ENDOBRONCHIAL ULTRASOUND Right 01/25/2018   Procedure: ENDOBRONCHIAL ULTRASOUND;  Surgeon: Tyler Pita, MD;  Location: [facility] ORS;  Service: Cardiopulmonary;  Laterality: Right;   TONSILLECTOMY     TUBAL LIGATION     UPPER GI ENDOSCOPY      Family History  Problem Relation Age of Onset   Breast cancer Sister 31   Breast cancer Maternal Aunt     Social History   Socioeconomic History   Marital status: Divorced    Spouse name: Not on file   Number of children: Not on file   Years of education: Not on file   Highest education level: Not on file  Occupational History   Not on file  Tobacco Use   Smoking status: Every Day    Packs/day: 2.00    Years: 42.00    Pack years: 84.00    Types: Cigarettes   Smokeless tobacco: Never   Tobacco comments:    currently 2 packs/day  Vaping Use   Vaping Use: Never used  Substance and Sexual Activity   Alcohol use: No   Drug use: No   Sexual activity: Not on file  Other Topics Concern   Not on file  Social History Narrative   Not on file   Social Determinants of Health   Financial Resource Strain: Not on file  Food Insecurity: Not on file  Transportation Needs: Not  on file  Physical Activity: Not on file  Stress: Not on file  Social Connections: Not on file  Intimate Partner Violence: Not on file     Current Outpatient Medications:    amLODipine-benazepril (LOTREL) 5-10 MG capsule, Take 1 capsule by mouth daily., Disp: 90 capsule, Rfl: 3   gabapentin (NEURONTIN) 300 MG capsule, Take 300 mg by mouth daily., Disp: , Rfl:    pantoprazole (PROTONIX) 40 MG tablet, Take 1 tablet (40 mg total) by mouth daily., Disp: 90 tablet, Rfl: 3   traMADol (ULTRAM) 50 MG tablet, Take one tablet by mouth 3 times a day, Disp: 90 tablet, Rfl: 0   Allergies  Allergen Reactions   Penicillins Hives    DID THE REACTION INVOLVE: Swelling of the face/tongue/throat, SOB, or low BP? No Sudden or severe rash/hives, skin peeling, or the inside of the mouth or nose? No Did it require medical treatment? No When did it last happen?      unkn If all above answers are NO, may proceed with cephalosporin use.     ROS Review of Systems  Constitutional: Negative.   HENT: Negative.    Eyes: Negative.  Respiratory: Negative.    Cardiovascular: Negative.   Gastrointestinal: Negative.   Endocrine: Negative.   Genitourinary: Negative.   Musculoskeletal: Negative.   Skin: Negative.   Allergic/Immunologic: Negative.   Neurological: Negative.   Hematological: Negative.   Psychiatric/Behavioral: Negative.    All other systems reviewed and are negative.    Objective:    Physical Exam Vitals reviewed.  Constitutional:      Appearance: Normal appearance.  HENT:     Mouth/Throat:     Mouth: Mucous membranes are moist.  Eyes:     Pupils: Pupils are equal, round, and reactive to light.  Neck:     Vascular: No carotid bruit.  Cardiovascular:     Rate and Rhythm: Normal rate and regular rhythm.     Pulses: Normal pulses.     Heart sounds: Normal heart sounds.  Pulmonary:     Effort: Pulmonary effort is normal.     Breath sounds: Normal breath sounds.  Abdominal:      General: Bowel sounds are normal.     Palpations: Abdomen is soft. There is no hepatomegaly, splenomegaly or mass.     Tenderness: There is no abdominal tenderness.     Hernia: No hernia is present.  Musculoskeletal:        General: No tenderness.     Cervical back: Neck supple.     Right lower leg: No edema.     Left lower leg: No edema.  Skin:    Findings: No rash.  Neurological:     Mental Status: She is alert and oriented to person, place, and time.     Motor: No weakness.  Psychiatric:        Mood and Affect: Mood and affect normal.        Behavior: Behavior normal.    BP (!) 112/54    Pulse 72    Ht 5\' 3"  (1.6 m)    Wt 111 lb 11.2 oz (50.7 kg)    BMI 19.79 kg/m  Wt Readings from Last 3 Encounters:  01/27/21 111 lb 11.2 oz (50.7 kg)  10/28/20 103 lb 6.4 oz (46.9 kg)  09/02/20 105 lb 9.6 oz (47.9 kg)     Health Maintenance Due  Topic Date Due   Pneumococcal Vaccine 39-41 Years old (1 - PCV) Never done   Hepatitis C Screening  Never done   PAP SMEAR-Modifier  Never done   COLONOSCOPY (Pts 45-47yrs Insurance coverage will need to be confirmed)  Never done   Zoster Vaccines- Shingrix (1 of 2) Never done   COVID-19 Vaccine (4 - Booster for Pfizer series) 01/12/2020    There are no preventive care reminders to display for this patient.  Lab Results  Component Value Date   TSH 1.27 04/15/2020   Lab Results  Component Value Date   WBC 11.8 (H) 06/21/2020   HGB 14.2 06/21/2020   HCT 41.2 06/21/2020   MCV 95.6 06/21/2020   PLT 303 06/21/2020   Lab Results  Component Value Date   NA 137 06/21/2020   K 3.9 06/21/2020   CO2 22 06/21/2020   GLUCOSE 111 (H) 06/21/2020   BUN 10 06/21/2020   CREATININE 0.66 06/21/2020   BILITOT 0.5 06/20/2020   ALKPHOS 57 06/20/2020   AST 28 06/20/2020   ALT 7 06/20/2020   PROT 7.6 06/20/2020   ALBUMIN 3.8 06/20/2020   CALCIUM 8.8 (L) 06/21/2020   ANIONGAP 11 06/21/2020   Lab Results  Component Value Date   CHOL 253 (  H)  04/15/2020   Lab Results  Component Value Date   HDL 60 04/15/2020   Lab Results  Component Value Date   LDLCALC 170 (H) 04/15/2020   Lab Results  Component Value Date   TRIG 108 04/15/2020   Lab Results  Component Value Date   CHOLHDL 4.2 04/15/2020   No results found for: HGBA1C    Assessment & Plan:   Problem List Items Addressed This Visit       Cardiovascular and Mediastinum   Essential hypertension    Blood pressure is stable      Traumatic subarachnoid hemorrhage with loss of consciousness of 30 minutes or less (HCC) - Primary    Stable at the present time        Other   Personal history of tobacco use, presenting hazards to health    - I instructed the patient to stop smoking and provided them with smoking cessation materials.  - I informed the patient that smoking puts them at increased risk for cancer, COPD, hypertension, and more.  - Informed the patient to seek help if they begin to have trouble breathing, develop chest pain, start to cough up blood, feel faint, or pass out.  Patient was advised to have a CT scan done      Myalgia    Under control on medication       Meds ordered this encounter  Medications   traMADol (ULTRAM) 50 MG tablet    Sig: Take one tablet by mouth 3 times a day    Dispense:  90 tablet    Refill:  0    Follow-up: No follow-ups on file.    Cletis Athens, MD

## 2021-01-27 NOTE — Assessment & Plan Note (Signed)
Blood pressure is stable 

## 2021-01-30 ENCOUNTER — Ambulatory Visit
Admission: RE | Admit: 2021-01-30 | Discharge: 2021-01-30 | Disposition: A | Discharge status: Home / Self Care | Payer: 59 | Source: Ambulatory Visit | Attending: Acute Care | Admitting: Acute Care

## 2021-01-30 ENCOUNTER — Other Ambulatory Visit: Payer: Self-pay

## 2021-01-30 DIAGNOSIS — F17210 Nicotine dependence, cigarettes, uncomplicated: Secondary | ICD-10-CM | POA: Diagnosis not present

## 2021-01-30 DIAGNOSIS — Z87891 Personal history of nicotine dependence: Secondary | ICD-10-CM | POA: Insufficient documentation

## 2021-02-03 ENCOUNTER — Other Ambulatory Visit: Payer: Self-pay

## 2021-02-03 DIAGNOSIS — Z87891 Personal history of nicotine dependence: Secondary | ICD-10-CM

## 2021-02-03 DIAGNOSIS — F17210 Nicotine dependence, cigarettes, uncomplicated: Secondary | ICD-10-CM

## 2021-02-27 ENCOUNTER — Other Ambulatory Visit: Payer: Self-pay

## 2021-02-27 MED ORDER — TRAMADOL HCL 50 MG PO TABS
ORAL_TABLET | ORAL | 0 refills | Status: DC
Start: 2021-01-27 — End: 2021-05-05
  Filled 2021-02-27: qty 90, 30d supply, fill #0

## 2021-03-31 ENCOUNTER — Other Ambulatory Visit: Payer: Self-pay

## 2021-05-05 ENCOUNTER — Encounter: Payer: Self-pay | Admitting: Internal Medicine

## 2021-05-05 ENCOUNTER — Ambulatory Visit: Payer: 59 | Admitting: Internal Medicine

## 2021-05-05 VITALS — BP 105/58 | HR 81 | Ht 63.0 in | Wt 111.7 lb

## 2021-05-05 DIAGNOSIS — J410 Simple chronic bronchitis: Secondary | ICD-10-CM | POA: Diagnosis not present

## 2021-05-05 DIAGNOSIS — I7300 Raynaud's syndrome without gangrene: Secondary | ICD-10-CM | POA: Diagnosis not present

## 2021-05-05 DIAGNOSIS — I10 Essential (primary) hypertension: Secondary | ICD-10-CM

## 2021-05-05 DIAGNOSIS — Z1231 Encounter for screening mammogram for malignant neoplasm of breast: Secondary | ICD-10-CM

## 2021-05-05 DIAGNOSIS — F17200 Nicotine dependence, unspecified, uncomplicated: Secondary | ICD-10-CM

## 2021-05-05 DIAGNOSIS — F5101 Primary insomnia: Secondary | ICD-10-CM | POA: Diagnosis not present

## 2021-05-05 MED ORDER — TRAMADOL HCL 50 MG PO TABS: ORAL_TABLET | ORAL | 0 refills | Status: DC

## 2021-05-05 NOTE — Assessment & Plan Note (Signed)
Patient will get annual mammogram. ?

## 2021-05-05 NOTE — Assessment & Plan Note (Signed)
-   I instructed the patient to stop smoking and provided them with smoking cessation materials.  - I informed the patient that smoking puts them at increased risk for cancer, COPD, hypertension, and more.  - Informed the patient to seek help if they begin to have trouble breathing, develop chest pain, start to cough up blood, feel faint, or pass out.  

## 2021-05-05 NOTE — Assessment & Plan Note (Signed)
Stable at the present time patient was advised to quit smoking ?

## 2021-05-05 NOTE — Progress Notes (Signed)
? ?Established Patient Office Visit ? ?Subjective:  ?Patient ID: Tracey Walters, female    DOB: [birth year]  Age: 61 y.o. MRN: [id number] ? ?CC:  ?Chief Complaint  ?Patient presents with  ? Medication Refill  ?  Patient here today for refill of tramadol   ? ? ?Medication Refill ? ? ?Tracey Walters presents for check up ?Past Medical History:  ?Diagnosis Date  ? Anxiety   ? Carpal tunnel syndrome   ? right wrist  ? Dyspnea   ? GERD (gastroesophageal reflux disease)   ? Hyperlipidemia   ? Hypertension 20 yrs  ? Raynaud's phenomenon   ? Rheumatoid arteritis ([place])   ? Ulcer   ? ? ?Past Surgical History:  ?Procedure Laterality Date  ? ABDOMINAL HYSTERECTOMY  1992  ? BREAST BIOPSY Right 2011  ? neg  ? BREAST BIOPSY Right 11/1996  ? Dense fibrosis,  benign epithelial hyperplasia.  ? CARPAL TUNNEL RELEASE Right 2012  ? COLONOSCOPY  2010  ? ENDOBRONCHIAL ULTRASOUND Right 01/25/2018  ? Procedure: ENDOBRONCHIAL ULTRASOUND;  Surgeon: Tyler Pita, MD;  Location: [facility] ORS;  Service: Cardiopulmonary;  Laterality: Right;  ? TONSILLECTOMY    ? TUBAL LIGATION    ? UPPER GI ENDOSCOPY    ? ? ?Family History  ?Problem Relation Age of Onset  ? Breast cancer Sister 36  ? Breast cancer Maternal Aunt   ? ? ?Social History  ? ?Socioeconomic History  ? Marital status: Divorced  ?  Spouse name: Not on file  ? Number of children: Not on file  ? Years of education: Not on file  ? Highest education level: Not on file  ?Occupational History  ? Not on file  ?Tobacco Use  ? Smoking status: Every Day  ?  Packs/day: 2.00  ?  Years: 42.00  ?  Pack years: 84.00  ?  Types: Cigarettes  ? Smokeless tobacco: Never  ? Tobacco comments:  ?  currently 2 packs/day  ?Vaping Use  ? Vaping Use: Never used  ?Substance and Sexual Activity  ? Alcohol use: No  ? Drug use: No  ? Sexual activity: Not on file  ?Other Topics Concern  ? Not on file  ?Social History Narrative  ? Not on file  ? ?Social Determinants of Health  ? ?Financial Resource Strain: Not on  file  ?Food Insecurity: Not on file  ?Transportation Needs: Not on file  ?Physical Activity: Not on file  ?Stress: Not on file  ?Social Connections: Not on file  ?Intimate Partner Violence: Not on file  ? ? ? ?Current Outpatient Medications:  ?  amLODipine-benazepril (LOTREL) 5-10 MG capsule, Take 1 capsule by mouth daily., Disp: 90 capsule, Rfl: 3 ?  gabapentin (NEURONTIN) 300 MG capsule, Take 300 mg by mouth daily., Disp: , Rfl:  ?  pantoprazole (PROTONIX) 40 MG tablet, Take 1 tablet (40 mg total) by mouth daily., Disp: 90 tablet, Rfl: 3 ?  traMADol (ULTRAM) 50 MG tablet, Take one tablet by mouth 3 times a day, Disp: 90 tablet, Rfl: 0  ? ?Allergies  ?Allergen Reactions  ? Penicillins Hives  ?  DID THE REACTION INVOLVE: Swelling of the face/tongue/throat, SOB, or low BP? No ?Sudden or severe rash/hives, skin peeling, or the inside of the mouth or nose? No ?Did it require medical treatment? No ?When did it last happen?      unkn ?If all above answers are ?NO?, may proceed with cephalosporin use. ?  ? ? ?ROS ?Review of Systems  ?  Constitutional: Negative.   ?HENT: Negative.    ?Eyes: Negative.   ?Respiratory: Negative.    ?Cardiovascular: Negative.   ?Gastrointestinal: Negative.   ?Endocrine: Negative.   ?Genitourinary: Negative.   ?Musculoskeletal: Negative.   ?Skin: Negative.   ?Allergic/Immunologic: Negative.   ?Neurological: Negative.   ?Hematological: Negative.   ?Psychiatric/Behavioral: Negative.    ?All other systems reviewed and are negative. ? ?  ?Objective:  ?  ?Physical Exam ?Vitals reviewed.  ?Constitutional:   ?   Appearance: Normal appearance.  ?HENT:  ?   Mouth/Throat:  ?   Mouth: Mucous membranes are moist.  ?Eyes:  ?   Pupils: Pupils are equal, round, and reactive to light.  ?Neck:  ?   Vascular: No carotid bruit.  ?Cardiovascular:  ?   Rate and Rhythm: Normal rate and regular rhythm.  ?   Pulses: Normal pulses.  ?   Heart sounds: Normal heart sounds.  ?Pulmonary:  ?   Effort: Pulmonary effort is  normal.  ?   Breath sounds: Normal breath sounds.  ?Abdominal:  ?   General: Bowel sounds are normal.  ?   Palpations: Abdomen is soft. There is no hepatomegaly, splenomegaly or mass.  ?   Tenderness: There is no abdominal tenderness.  ?   Hernia: No hernia is present.  ?Musculoskeletal:     ?   General: No tenderness.  ?   Cervical back: Neck supple.  ?   Right lower leg: No edema.  ?   Left lower leg: No edema.  ?Skin: ?   Findings: No rash.  ?Neurological:  ?   Mental Status: She is alert and oriented to person, place, and time.  ?   Motor: No weakness.  ?Psychiatric:     ?   Mood and Affect: Mood and affect normal.     ?   Behavior: Behavior normal.  ? ? ?BP (!) 105/58   Pulse 81   Ht '5\' 3"'$  (1.6 m)   Wt 111 lb 11.2 oz (50.7 kg)   BMI 19.79 kg/m?  ?Wt Readings from Last 3 Encounters:  ?05/05/21 111 lb 11.2 oz (50.7 kg)  ?01/27/21 111 lb 11.2 oz (50.7 kg)  ?10/28/20 103 lb 6.4 oz (46.9 kg)  ? ? ? ?Health Maintenance Due  ?Topic Date Due  ? Hepatitis C Screening  Never done  ? PAP SMEAR-Modifier  Never done  ? COLONOSCOPY (Pts 45-54yr Insurance coverage will need to be confirmed)  Never done  ? Zoster Vaccines- Shingrix (1 of 2) Never done  ? COVID-19 Vaccine (4 - Booster for Pfizer series) 01/12/2020  ? ? ?There are no preventive care reminders to display for this patient. ? ?Lab Results  ?Component Value Date  ? TSH 1.27 04/15/2020  ? ?Lab Results  ?Component Value Date  ? WBC 11.8 (H) 06/21/2020  ? HGB 14.2 06/21/2020  ? HCT 41.2 06/21/2020  ? MCV 95.6 06/21/2020  ? PLT 303 06/21/2020  ? ?Lab Results  ?Component Value Date  ? NA 137 06/21/2020  ? K 3.9 06/21/2020  ? CO2 22 06/21/2020  ? GLUCOSE 111 (H) 06/21/2020  ? BUN 10 06/21/2020  ? CREATININE 0.66 06/21/2020  ? BILITOT 0.5 06/20/2020  ? ALKPHOS 57 06/20/2020  ? AST 28 06/20/2020  ? ALT 7 06/20/2020  ? PROT 7.6 06/20/2020  ? ALBUMIN 3.8 06/20/2020  ? CALCIUM 8.8 (L) 06/21/2020  ? ANIONGAP 11 06/21/2020  ? ?Lab Results  ?Component Value Date  ? CHOL 253  (H) 04/15/2020  ? ?  Lab Results  ?Component Value Date  ? HDL 60 04/15/2020  ? ?Lab Results  ?Component Value Date  ? LDLCALC 170 (H) 04/15/2020  ? ?Lab Results  ?Component Value Date  ? TRIG 108 04/15/2020  ? ?Lab Results  ?Component Value Date  ? CHOLHDL 4.2 04/15/2020  ? ?No results found for: HGBA1C ? ?  ?Assessment & Plan:  ? ?Problem List Items Addressed This Visit   ? ?  ? Cardiovascular and Mediastinum  ? Essential hypertension - Primary  ? Raynaud's disease without gangrene  ?  Stable at the present time patient was advised to quit smoking ? ?  ?  ?  ? Respiratory  ? Simple chronic bronchitis ([place])  ?  - I instructed the patient to stop smoking and provided them with smoking cessation materials.  ?- I informed the patient that smoking puts them at increased risk for cancer, COPD, hypertension, and more.  ?- Informed the patient to seek help if they begin to have trouble breathing, develop chest pain, start to cough up blood, feel faint, or pass out. ? ?  ?  ?  ? Other  ? Encounter for screening mammogram for high-risk patient  ?  Patient will get annual mammogram. ? ?  ?  ? Smoker  ?  Counseled patient on the dangers of tobacco use, advised patient to stop smoking, and reviewed strategies to maximize success Smoking cessation instruction/counseling given:  counseled patient on the dangers of tobacco use, advised patient to stop smoking, and reviewed strategies to maximize success It is very important that pt quit smoking. There are various alternatives available to help with this difficult task, but first and foremost, pt must make a firm commitment and decision to quit. The nature of nicotine addiction is discussed. The usefulness of behavioral therapy is discussed and suggested.  The correct use, cost and side effects of nicotine replacement therapy such as gum or patches is discussed. Bupropion and its cost (sometimes not covered fully by insurance) and side effects are reviewed. The quit rates are  discussed. I recommend pt not allow potential costs of treatment to deter ptfrom using nicotine replacement therapy or bupropion, as the long term economic and health benefits are obvious.  ? ?  ?  ? Primary insomnia

## 2021-05-05 NOTE — Assessment & Plan Note (Signed)

## 2021-05-05 NOTE — Assessment & Plan Note (Signed)
?-   Encouraged patient to engage in relaxing activities like yoga, meditation, journaling, going for a walk, or participating in a hobby.  ?- Encouraged patient to reach out to trusted friends or family members about recent struggles, ?Patient was advised to read A book, how to stop worrying and start living, it is good book to read to control  the stress ? ?

## 2021-05-08 ENCOUNTER — Other Ambulatory Visit: Payer: Self-pay

## 2021-05-08 MED ORDER — TRAMADOL HCL 50 MG PO TABS
ORAL_TABLET | ORAL | 0 refills | Status: DC
Start: 2021-05-05 — End: 2021-06-23
  Filled 2021-05-08: qty 90, 30d supply, fill #0

## 2021-06-23 ENCOUNTER — Other Ambulatory Visit: Payer: Self-pay

## 2021-06-23 ENCOUNTER — Ambulatory Visit: Payer: 59 | Admitting: Internal Medicine

## 2021-06-23 ENCOUNTER — Encounter: Payer: Self-pay | Admitting: Internal Medicine

## 2021-06-23 VITALS — Ht 63.0 in | Wt 114.5 lb

## 2021-06-23 DIAGNOSIS — I10 Essential (primary) hypertension: Secondary | ICD-10-CM

## 2021-06-23 DIAGNOSIS — Z87891 Personal history of nicotine dependence: Secondary | ICD-10-CM

## 2021-06-23 DIAGNOSIS — K219 Gastro-esophageal reflux disease without esophagitis: Secondary | ICD-10-CM

## 2021-06-23 DIAGNOSIS — I7300 Raynaud's syndrome without gangrene: Secondary | ICD-10-CM | POA: Diagnosis not present

## 2021-06-23 DIAGNOSIS — Z86010 Personal history of colonic polyps: Secondary | ICD-10-CM

## 2021-06-23 DIAGNOSIS — F5101 Primary insomnia: Secondary | ICD-10-CM | POA: Diagnosis not present

## 2021-06-23 DIAGNOSIS — M791 Myalgia, unspecified site: Secondary | ICD-10-CM | POA: Diagnosis not present

## 2021-06-23 MED ORDER — TRAMADOL HCL 50 MG PO TABS: ORAL_TABLET | ORAL | 0 refills | Status: DC

## 2021-06-23 MED ORDER — AMLODIPINE BESY-BENAZEPRIL HCL 5-10 MG PO CAPS
1.0000 | ORAL_CAPSULE | Freq: Every day | ORAL | 3 refills | Status: DC
  Filled 2021-06-23: qty 90, 90d supply, fill #0
  Filled 2021-09-23: qty 90, 90d supply, fill #1

## 2021-06-23 MED ORDER — PANTOPRAZOLE SODIUM 40 MG PO TBEC
40.0000 mg | DELAYED_RELEASE_TABLET | Freq: Every day | ORAL | 3 refills | Status: DC
  Filled 2021-06-23: qty 90, 90d supply, fill #0
  Filled 2021-09-23: qty 90, 90d supply, fill #1
  Filled 2021-12-25: qty 90, 90d supply, fill #2
  Filled 2022-03-26: qty 90, 90d supply, fill #3

## 2021-06-23 NOTE — Assessment & Plan Note (Signed)
Stable

## 2021-06-23 NOTE — Assessment & Plan Note (Signed)
Stable at the present time. 

## 2021-06-23 NOTE — Progress Notes (Signed)
Established Patient Office Visit  Subjective:  Patient ID: Tracey Walters, female    DOB: [birth year]  Age: 61 y.o. MRN: [id number]  CC:  Chief Complaint  Patient presents with   Medication Refill    Patient is here for her medication refill of tramadol    Medication Refill    [name] presents for general checkup patient continues to smoke 1 to 2 packs/day does not drink Raynaud's is under control  Past Medical History:  Diagnosis Date   Anxiety    Carpal tunnel syndrome    right wrist   Dyspnea    GERD (gastroesophageal reflux disease)    Hyperlipidemia    Hypertension 20 yrs   Raynaud's phenomenon    Rheumatoid arteritis ([place])    Ulcer     Past Surgical History:  Procedure Laterality Date   ABDOMINAL HYSTERECTOMY  1992   BREAST BIOPSY Right 2011   neg   BREAST BIOPSY Right 11/1996   Dense fibrosis,  benign epithelial hyperplasia.   CARPAL TUNNEL RELEASE Right 2012   COLONOSCOPY  2010   ENDOBRONCHIAL ULTRASOUND Right 01/25/2018   Procedure: ENDOBRONCHIAL ULTRASOUND;  Surgeon: Tyler Pita, MD;  Location: [facility] ORS;  Service: Cardiopulmonary;  Laterality: Right;   TONSILLECTOMY     TUBAL LIGATION     UPPER GI ENDOSCOPY      Family History  Problem Relation Age of Onset   Breast cancer Sister 82   Breast cancer Maternal Aunt     Social History   Socioeconomic History   Marital status: Divorced    Spouse name: Not on file   Number of children: Not on file   Years of education: Not on file   Highest education level: Not on file  Occupational History   Not on file  Tobacco Use   Smoking status: Every Day    Packs/day: 2.00    Years: 42.00    Total pack years: 84.00    Types: Cigarettes   Smokeless tobacco: Never   Tobacco comments:    currently 2 packs/day  Vaping Use   Vaping Use: Never used  Substance and Sexual Activity   Alcohol use: No   Drug use: No   Sexual activity: Not on file  Other Topics Concern   Not on file   Social History Narrative   Not on file   Social Determinants of Health   Financial Resource Strain: Not on file  Food Insecurity: Not on file  Transportation Needs: Not on file  Physical Activity: Not on file  Stress: Not on file  Social Connections: Not on file  Intimate Partner Violence: Not on file     Current Outpatient Medications:    gabapentin (NEURONTIN) 300 MG capsule, Take 300 mg by mouth daily., Disp: , Rfl:    amLODipine-benazepril (LOTREL) 5-10 MG capsule, Take 1 capsule by mouth daily., Disp: 90 capsule, Rfl: 3   pantoprazole (PROTONIX) 40 MG tablet, Take 1 tablet (40 mg total) by mouth daily., Disp: 90 tablet, Rfl: 3   traMADol (ULTRAM) 50 MG tablet, Take one tablet by mouth 3 times a day, Disp: 90 tablet, Rfl: 0   Allergies  Allergen Reactions   Penicillins Hives    DID THE REACTION INVOLVE: Swelling of the face/tongue/throat, SOB, or low BP? No Sudden or severe rash/hives, skin peeling, or the inside of the mouth or nose? No Did it require medical treatment? No When did it last happen?      unkn If  all above answers are "NO", may proceed with cephalosporin use.     ROS Review of Systems  Constitutional: Negative.   HENT: Negative.    Eyes: Negative.   Respiratory: Negative.    Cardiovascular: Negative.   Gastrointestinal: Negative.   Endocrine: Negative.   Genitourinary: Negative.   Musculoskeletal: Negative.   Skin: Negative.   Allergic/Immunologic: Negative.   Neurological: Negative.   Hematological: Negative.   Psychiatric/Behavioral: Negative.    All other systems reviewed and are negative.     Objective:    Physical Exam Vitals reviewed.  Constitutional:      Appearance: Normal appearance.  HENT:     Mouth/Throat:     Mouth: Mucous membranes are moist.  Eyes:     Pupils: Pupils are equal, round, and reactive to light.  Neck:     Vascular: No carotid bruit.  Cardiovascular:     Rate and Rhythm: Normal rate and regular rhythm.      Pulses: Normal pulses.     Heart sounds: Normal heart sounds.  Pulmonary:     Effort: Pulmonary effort is normal.     Breath sounds: Normal breath sounds.  Abdominal:     General: Bowel sounds are normal.     Palpations: Abdomen is soft. There is no hepatomegaly, splenomegaly or mass.     Tenderness: There is no abdominal tenderness.     Hernia: No hernia is present.  Musculoskeletal:        General: No tenderness.     Cervical back: Neck supple.     Right lower leg: No edema.     Left lower leg: No edema.  Skin:    Findings: No rash.  Neurological:     Mental Status: She is alert and oriented to person, place, and time.     Motor: No weakness.  Psychiatric:        Mood and Affect: Mood and affect normal.        Behavior: Behavior normal.     Ht '5\' 3"'$  (1.6 m)   Wt 114 lb 8 oz (51.9 kg)   BMI 20.28 kg/m  Wt Readings from Last 3 Encounters:  06/23/21 114 lb 8 oz (51.9 kg)  05/05/21 111 lb 11.2 oz (50.7 kg)  01/27/21 111 lb 11.2 oz (50.7 kg)     Health Maintenance Due  Topic Date Due   Hepatitis C Screening  Never done   PAP SMEAR-Modifier  Never done   COLONOSCOPY (Pts 45-38yr Insurance coverage will need to be confirmed)  Never done   Zoster Vaccines- Shingrix (1 of 2) Never done   COVID-19 Vaccine (4 - Pfizer series) 01/12/2020    There are no preventive care reminders to display for this patient.  Lab Results  Component Value Date   TSH 1.27 04/15/2020   Lab Results  Component Value Date   WBC 11.8 (H) 06/21/2020   HGB 14.2 06/21/2020   HCT 41.2 06/21/2020   MCV 95.6 06/21/2020   PLT 303 06/21/2020   Lab Results  Component Value Date   NA 137 06/21/2020   K 3.9 06/21/2020   CO2 22 06/21/2020   GLUCOSE 111 (H) 06/21/2020   BUN 10 06/21/2020   CREATININE 0.66 06/21/2020   BILITOT 0.5 06/20/2020   ALKPHOS 57 06/20/2020   AST 28 06/20/2020   ALT 7 06/20/2020   PROT 7.6 06/20/2020   ALBUMIN 3.8 06/20/2020   CALCIUM 8.8 (L) 06/21/2020    ANIONGAP 11 06/21/2020   Lab Results  Component Value Date   CHOL 253 (H) 04/15/2020   Lab Results  Component Value Date   HDL 60 04/15/2020   Lab Results  Component Value Date   LDLCALC 170 (H) 04/15/2020   Lab Results  Component Value Date   TRIG 108 04/15/2020   Lab Results  Component Value Date   CHOLHDL 4.2 04/15/2020   No results found for: "HGBA1C"    Assessment & Plan:   Problem List Items Addressed This Visit       Cardiovascular and Mediastinum   Essential hypertension    The following hypertensive lifestyle modification were recommended and discussed:  1. Limiting alcohol intake to less than 1 oz/day of ethanol:(24 oz of beer or 8 oz of wine or 2 oz of 100-proof whiskey). 2. Take baby ASA 81 mg daily. 3. Importance of regular aerobic exercise and losing weight. 4. Reduce dietary saturated fat and cholesterol intake for overall cardiovascular health. 5. Maintaining adequate dietary potassium, calcium, and magnesium intake. 6. Regular monitoring of the blood pressure. 7. Reduce sodium intake to less than 100 mmol/day (less than 2.3 gm of sodium or less than 6 gm of sodium choride)       Relevant Medications   amLODipine-benazepril (LOTREL) 5-10 MG capsule   Raynaud's disease without gangrene - Primary    Stable at the present time      Relevant Medications   amLODipine-benazepril (LOTREL) 5-10 MG capsule     Other   Personal history of tobacco use, presenting hazards to health   History of colonic polyps   Primary insomnia    Stable      Myalgia    Under control      Other Visit Diagnoses     Gastroesophageal reflux disease without esophagitis       Relevant Medications   pantoprazole (PROTONIX) 40 MG tablet       Meds ordered this encounter  Medications   traMADol (ULTRAM) 50 MG tablet    Sig: Take one tablet by mouth 3 times a day    Dispense:  90 tablet    Refill:  0   pantoprazole (PROTONIX) 40 MG tablet    Sig: Take 1 tablet  (40 mg total) by mouth daily.    Dispense:  90 tablet    Refill:  3   amLODipine-benazepril (LOTREL) 5-10 MG capsule    Sig: Take 1 capsule by mouth daily.    Dispense:  90 capsule    Refill:  3    Follow-up: No follow-ups on file.    Cletis Athens, MD

## 2021-06-23 NOTE — Assessment & Plan Note (Signed)
Under control 

## 2021-06-23 NOTE — Assessment & Plan Note (Signed)

## 2021-06-25 ENCOUNTER — Other Ambulatory Visit: Payer: Self-pay

## 2021-06-25 MED ORDER — TRAMADOL HCL 50 MG PO TABS
ORAL_TABLET | ORAL | 0 refills | Status: DC
  Filled 2021-06-25: qty 90, 30d supply, fill #0

## 2021-08-25 ENCOUNTER — Ambulatory Visit: Payer: 59 | Admitting: Internal Medicine

## 2021-08-25 ENCOUNTER — Encounter: Payer: Self-pay | Admitting: Internal Medicine

## 2021-08-25 VITALS — BP 120/65 | HR 90 | Ht 63.0 in | Wt 114.5 lb

## 2021-08-25 DIAGNOSIS — I7300 Raynaud's syndrome without gangrene: Secondary | ICD-10-CM | POA: Diagnosis not present

## 2021-08-25 DIAGNOSIS — M791 Myalgia, unspecified site: Secondary | ICD-10-CM

## 2021-08-25 DIAGNOSIS — I10 Essential (primary) hypertension: Secondary | ICD-10-CM | POA: Diagnosis not present

## 2021-08-25 DIAGNOSIS — F17200 Nicotine dependence, unspecified, uncomplicated: Secondary | ICD-10-CM | POA: Diagnosis not present

## 2021-08-25 MED ORDER — TRAMADOL HCL 50 MG PO TABS
ORAL_TABLET | ORAL | 0 refills | Status: DC
  Filled 2021-08-28 (×2): qty 90, 30d supply, fill #0

## 2021-08-25 NOTE — Assessment & Plan Note (Signed)
-   I instructed the patient to stop smoking and provided them with smoking cessation materials.  - I informed the patient that smoking puts them at increased risk for cancer, COPD, hypertension, and more.  - Informed the patient to seek help if they begin to have trouble breathing, develop chest pain, start to cough up blood, feel faint, or pass out.  

## 2021-08-25 NOTE — Assessment & Plan Note (Signed)
Patient denies any history of claudication, advised to quit smoking

## 2021-08-25 NOTE — Progress Notes (Signed)
Established Patient Office Visit  Subjective:  Patient ID: Tracey Walters, female    DOB: [birth year]  Age: 61 y.o. MRN: [id number]  CC:  Chief Complaint  Patient presents with   Medication Refill    Medication Refill    Tracey Walters presents for check up  Past Medical History:  Diagnosis Date   Anxiety    Carpal tunnel syndrome    right wrist   Dyspnea    GERD (gastroesophageal reflux disease)    Hyperlipidemia    Hypertension 20 yrs   Raynaud's phenomenon    Rheumatoid arteritis ([place])    Ulcer     Past Surgical History:  Procedure Laterality Date   ABDOMINAL HYSTERECTOMY  1992   BREAST BIOPSY Right 2011   neg   BREAST BIOPSY Right 11/1996   Dense fibrosis,  benign epithelial hyperplasia.   CARPAL TUNNEL RELEASE Right 2012   COLONOSCOPY  2010   ENDOBRONCHIAL ULTRASOUND Right 01/25/2018   Procedure: ENDOBRONCHIAL ULTRASOUND;  Surgeon: Tyler Pita, MD;  Location: [facility] ORS;  Service: Cardiopulmonary;  Laterality: Right;   TONSILLECTOMY     TUBAL LIGATION     UPPER GI ENDOSCOPY      Family History  Problem Relation Age of Onset   Breast cancer Sister 81   Breast cancer Maternal Aunt     Social History   Socioeconomic History   Marital status: Divorced    Spouse name: Not on file   Number of children: Not on file   Years of education: Not on file   Highest education level: Not on file  Occupational History   Not on file  Tobacco Use   Smoking status: Every Day    Packs/day: 2.00    Years: 42.00    Total pack years: 84.00    Types: Cigarettes   Smokeless tobacco: Never   Tobacco comments:    currently 2 packs/day  Vaping Use   Vaping Use: Never used  Substance and Sexual Activity   Alcohol use: No   Drug use: No   Sexual activity: Not on file  Other Topics Concern   Not on file  Social History Narrative   Not on file   Social Determinants of Health   Financial Resource Strain: Not on file  Food Insecurity: Not on file   Transportation Needs: Not on file  Physical Activity: Not on file  Stress: Not on file  Social Connections: Not on file  Intimate Partner Violence: Not on file     Current Outpatient Medications:    amLODipine-benazepril (LOTREL) 5-10 MG capsule, Take 1 capsule by mouth daily., Disp: 90 capsule, Rfl: 3   gabapentin (NEURONTIN) 300 MG capsule, Take 300 mg by mouth daily., Disp: , Rfl:    pantoprazole (PROTONIX) 40 MG tablet, Take 1 tablet (40 mg total) by mouth daily., Disp: 90 tablet, Rfl: 3   traMADol (ULTRAM) 50 MG tablet, Take one tablet by mouth 3 times a day, Disp: 90 tablet, Rfl: 0   Allergies  Allergen Reactions   Penicillins Hives    DID THE REACTION INVOLVE: Swelling of the face/tongue/throat, SOB, or low BP? No Sudden or severe rash/hives, skin peeling, or the inside of the mouth or nose? No Did it require medical treatment? No When did it last happen?      unkn If all above answers are "NO", may proceed with cephalosporin use.     ROS Review of Systems  Constitutional: Negative.   HENT: Negative.  Eyes: Negative.   Respiratory: Negative.    Cardiovascular: Negative.   Gastrointestinal: Negative.   Endocrine: Negative.   Genitourinary: Negative.   Musculoskeletal: Negative.   Skin: Negative.   Allergic/Immunologic: Negative.   Neurological: Negative.   Hematological: Negative.   Psychiatric/Behavioral: Negative.    All other systems reviewed and are negative.     Objective:    Physical Exam Vitals reviewed.  Constitutional:      Appearance: Normal appearance.  HENT:     Mouth/Throat:     Mouth: Mucous membranes are moist.  Eyes:     Pupils: Pupils are equal, round, and reactive to light.  Neck:     Vascular: No carotid bruit.  Cardiovascular:     Rate and Rhythm: Normal rate and regular rhythm.     Pulses: Normal pulses.     Heart sounds: Normal heart sounds.  Pulmonary:     Effort: Pulmonary effort is normal.     Breath sounds: Normal  breath sounds.  Abdominal:     General: Bowel sounds are normal.     Palpations: Abdomen is soft. There is no hepatomegaly, splenomegaly or mass.     Tenderness: There is no abdominal tenderness.     Hernia: No hernia is present.  Musculoskeletal:        General: No tenderness.     Cervical back: Neck supple.     Right lower leg: No edema.     Left lower leg: No edema.  Skin:    Findings: No rash.  Neurological:     Mental Status: She is alert and oriented to person, place, and time.     Motor: No weakness.  Psychiatric:        Mood and Affect: Mood and affect normal.        Behavior: Behavior normal.     BP 120/65   Pulse 90   Ht '5\' 3"'$  (1.6 m)   Wt 114 lb 8 oz (51.9 kg)   BMI 20.28 kg/m  Wt Readings from Last 3 Encounters:  08/25/21 114 lb 8 oz (51.9 kg)  06/23/21 114 lb 8 oz (51.9 kg)  05/05/21 111 lb 11.2 oz (50.7 kg)     Health Maintenance Due  Topic Date Due   Hepatitis C Screening  Never done   PAP SMEAR-Modifier  Never done   COLONOSCOPY (Pts 45-7yr Insurance coverage will need to be confirmed)  Never done   Zoster Vaccines- Shingrix (1 of 2) Never done   COVID-19 Vaccine (4 - Pfizer series) 01/12/2020   INFLUENZA VACCINE  08/12/2021    There are no preventive care reminders to display for this patient.  Lab Results  Component Value Date   TSH 1.27 04/15/2020   Lab Results  Component Value Date   WBC 11.8 (H) 06/21/2020   HGB 14.2 06/21/2020   HCT 41.2 06/21/2020   MCV 95.6 06/21/2020   PLT 303 06/21/2020   Lab Results  Component Value Date   NA 137 06/21/2020   K 3.9 06/21/2020   CO2 22 06/21/2020   GLUCOSE 111 (H) 06/21/2020   BUN 10 06/21/2020   CREATININE 0.66 06/21/2020   BILITOT 0.5 06/20/2020   ALKPHOS 57 06/20/2020   AST 28 06/20/2020   ALT 7 06/20/2020   PROT 7.6 06/20/2020   ALBUMIN 3.8 06/20/2020   CALCIUM 8.8 (L) 06/21/2020   ANIONGAP 11 06/21/2020   Lab Results  Component Value Date   CHOL 253 (H) 04/15/2020   Lab  Results  Component Value Date   HDL 60 04/15/2020   Lab Results  Component Value Date   LDLCALC 170 (H) 04/15/2020   Lab Results  Component Value Date   TRIG 108 04/15/2020   Lab Results  Component Value Date   CHOLHDL 4.2 04/15/2020   No results found for: "HGBA1C"    Assessment & Plan:   Problem List Items Addressed This Visit       Cardiovascular and Mediastinum   Essential hypertension - Primary     Patient denies any chest pain or shortness of breath there is no history of palpitation or paroxysmal nocturnal dyspnea   patient was advised to follow low-salt low-cholesterol diet    ideally I want to keep systolic blood pressure below 130 mmHg, patient was asked to check blood pressure one times a week and give me a report on that.  Patient will be follow-up in 3 months  or earlier as needed, patient will call me back for any change in the cardiovascular symptoms Patient was advised to buy a book from local bookstore concerning blood pressure and read several chapters  every day.  This will be supplemented by some of the material we will give him from the office.  Patient should also utilize other resources like YouTube and Internet to learn more about the blood pressure and the diet.      Raynaud's disease without gangrene    Patient denies any history of claudication, advised to quit smoking        Other   Smoker    - I instructed the patient to stop smoking and provided them with smoking cessation materials.  - I informed the patient that smoking puts them at increased risk for cancer, COPD, hypertension, and more.  - Informed the patient to seek help if they begin to have trouble breathing, develop chest pain, start to cough up blood, feel faint, or pass out.      Myalgia    Stable at the present time       Meds ordered this encounter  Medications   traMADol (ULTRAM) 50 MG tablet    Sig: Take one tablet by mouth 3 times a day    Dispense:  90 tablet     Refill:  0    Follow-up: No follow-ups on file.    Cletis Athens, MD

## 2021-08-25 NOTE — Assessment & Plan Note (Signed)

## 2021-08-25 NOTE — Assessment & Plan Note (Signed)
Stable at the present time. 

## 2021-08-28 ENCOUNTER — Other Ambulatory Visit: Payer: Self-pay

## 2021-09-23 ENCOUNTER — Other Ambulatory Visit: Payer: Self-pay

## 2021-10-15 ENCOUNTER — Other Ambulatory Visit: Payer: Self-pay | Admitting: General Surgery

## 2021-10-15 DIAGNOSIS — Z1239 Encounter for other screening for malignant neoplasm of breast: Secondary | ICD-10-CM

## 2021-10-23 ENCOUNTER — Telehealth: Payer: Self-pay | Admitting: *Deleted

## 2021-10-23 NOTE — Telephone Encounter (Signed)
Spoke with patient who is questioning if needs any further follow up with Dr. Patsey Berthold. States that she has not had follow up with her in a few years and was just curious if she needed to schedule an appt. Spoke with Dr. Patsey Berthold who stated that pt may continue annual low dose CT scans in the lung screening program at this time and be seen in the future to address suspicious findings if seen on future scans. Pt has been made aware and informed that she is due for her next low dose CT scan in Jan 2024 which the lung screening program will schedule for her closer to that time. Nothing further needed at this time.

## 2021-10-27 ENCOUNTER — Ambulatory Visit: Payer: 59 | Admitting: Internal Medicine

## 2021-10-27 ENCOUNTER — Other Ambulatory Visit: Payer: Self-pay

## 2021-10-27 ENCOUNTER — Encounter: Payer: Self-pay | Admitting: Internal Medicine

## 2021-10-27 VITALS — BP 97/65 | HR 75 | Ht 63.0 in | Wt 112.0 lb

## 2021-10-27 DIAGNOSIS — F17200 Nicotine dependence, unspecified, uncomplicated: Secondary | ICD-10-CM | POA: Diagnosis not present

## 2021-10-27 DIAGNOSIS — I10 Essential (primary) hypertension: Secondary | ICD-10-CM | POA: Diagnosis not present

## 2021-10-27 DIAGNOSIS — S066X1D Traumatic subarachnoid hemorrhage with loss of consciousness of 30 minutes or less, subsequent encounter: Secondary | ICD-10-CM

## 2021-10-27 MED ORDER — AMLODIPINE BESYLATE 2.5 MG PO TABS
2.5000 mg | ORAL_TABLET | Freq: Every day | ORAL | 3 refills | Status: DC
  Filled 2021-10-27: qty 90, 90d supply, fill #0
  Filled 2022-01-19: qty 90, 90d supply, fill #1
  Filled 2022-03-26 – 2022-04-21 (×2): qty 90, 90d supply, fill #2
  Filled 2022-06-21 – 2022-07-24 (×2): qty 90, 90d supply, fill #3

## 2021-10-27 MED ORDER — TRAMADOL HCL 50 MG PO TABS: ORAL_TABLET | ORAL | 0 refills | Status: DC

## 2021-10-27 NOTE — Progress Notes (Unsigned)
Established Patient Office Visit  Subjective:  Patient ID: Tracey Walters, female    DOB: Jan 27, 1960  Age: 61 y.o. MRN: [id number]  CC:  Chief Complaint  Patient presents with   Follow-up   Medication Refill    Medication Refill    Tracey Walters presents for check up  Past Medical History:  Diagnosis Date   Anxiety    Carpal tunnel syndrome    right wrist   Dyspnea    GERD (gastroesophageal reflux disease)    Hyperlipidemia    Hypertension 20 yrs   Raynaud's phenomenon    Rheumatoid arteritis ([place])    Ulcer     Past Surgical History:  Procedure Laterality Date   ABDOMINAL HYSTERECTOMY  1992   BREAST BIOPSY Right 2011   neg   BREAST BIOPSY Right 11/1996   Dense fibrosis,  benign epithelial hyperplasia.   CARPAL TUNNEL RELEASE Right 2012   COLONOSCOPY  2010   ENDOBRONCHIAL ULTRASOUND Right 01/25/2018   Procedure: ENDOBRONCHIAL ULTRASOUND;  Surgeon: Tyler Pita, MD;  Location: [facility] ORS;  Service: Cardiopulmonary;  Laterality: Right;   TONSILLECTOMY     TUBAL LIGATION     UPPER GI ENDOSCOPY      Family History  Problem Relation Age of Onset   Breast cancer Sister 32   Breast cancer Maternal Aunt     Social History   Socioeconomic History   Marital status: Divorced    Spouse name: Not on file   Number of children: Not on file   Years of education: Not on file   Highest education level: Not on file  Occupational History   Not on file  Tobacco Use   Smoking status: Every Day    Packs/day: 2.00    Years: 42.00    Total pack years: 84.00    Types: Cigarettes   Smokeless tobacco: Never   Tobacco comments:    currently 2 packs/day  Vaping Use   Vaping Use: Never used  Substance and Sexual Activity   Alcohol use: No   Drug use: No   Sexual activity: Not on file  Other Topics Concern   Not on file  Social History Narrative   Not on file   Social Determinants of Health   Financial Resource Strain: Not on file  Food Insecurity:  Not on file  Transportation Needs: Not on file  Physical Activity: Not on file  Stress: Not on file  Social Connections: Not on file  Intimate Partner Violence: Not on file     Current Outpatient Medications:    amLODipine-benazepril (LOTREL) 5-10 MG capsule, Take 1 capsule by mouth daily., Disp: 90 capsule, Rfl: 3   gabapentin (NEURONTIN) 300 MG capsule, Take 300 mg by mouth daily., Disp: , Rfl:    pantoprazole (PROTONIX) 40 MG tablet, Take 1 tablet (40 mg total) by mouth daily., Disp: 90 tablet, Rfl: 3   traMADol (ULTRAM) 50 MG tablet, Take one tablet by mouth 3 times a day, Disp: 90 tablet, Rfl: 0   Allergies  Allergen Reactions   Penicillins Hives    DID THE REACTION INVOLVE: Swelling of the face/tongue/throat, SOB, or low BP? No Sudden or severe rash/hives, skin peeling, or the inside of the mouth or nose? No Did it require medical treatment? No When did it last happen?      unkn If all above answers are "NO", may proceed with cephalosporin use.     ROS Review of Systems  Constitutional: Negative.   HENT:  Negative.    Eyes: Negative.   Respiratory: Negative.    Cardiovascular: Negative.   Gastrointestinal: Negative.   Endocrine: Negative.   Genitourinary: Negative.   Musculoskeletal: Negative.   Skin: Negative.   Allergic/Immunologic: Negative.   Neurological: Negative.   Hematological: Negative.   Psychiatric/Behavioral: Negative.    All other systems reviewed and are negative.     Objective:    Physical Exam Vitals reviewed.  Constitutional:      Appearance: Normal appearance. She is not diaphoretic.  HENT:     Mouth/Throat:     Mouth: Mucous membranes are moist.  Eyes:     Pupils: Pupils are equal, round, and reactive to light.  Neck:     Vascular: No carotid bruit.  Cardiovascular:     Rate and Rhythm: Normal rate and regular rhythm.     Pulses: Normal pulses.     Heart sounds: Normal heart sounds.  Pulmonary:     Effort: Pulmonary effort is  normal.     Breath sounds: Normal breath sounds.  Abdominal:     General: Bowel sounds are normal.     Palpations: Abdomen is soft. There is no hepatomegaly, splenomegaly or mass.     Tenderness: There is no abdominal tenderness.     Hernia: No hernia is present.  Musculoskeletal:        General: No tenderness.     Cervical back: Neck supple.     Right lower leg: No edema.     Left lower leg: No edema.  Skin:    Findings: No rash.  Neurological:     Mental Status: She is alert and oriented to person, place, and time.     Motor: No weakness.  Psychiatric:        Mood and Affect: Mood and affect normal.        Behavior: Behavior normal.     BP 97/65   Pulse 75   Ht '5\' 3"'$  (1.6 m)   Wt 112 lb (50.8 kg)   BMI 19.84 kg/m  Wt Readings from Last 3 Encounters:  10/27/21 112 lb (50.8 kg)  08/25/21 114 lb 8 oz (51.9 kg)  06/23/21 114 lb 8 oz (51.9 kg)     Health Maintenance Due  Topic Date Due   PAP SMEAR-Modifier  Never done   COLONOSCOPY (Pts 45-59yr Insurance coverage will need to be confirmed)  Never done   Zoster Vaccines- Shingrix (1 of 2) Never done   COVID-19 Vaccine (4 - Pfizer series) 01/12/2020    There are no preventive care reminders to display for this patient.  Lab Results  Component Value Date   TSH 1.27 04/15/2020   Lab Results  Component Value Date   WBC 11.8 (H) 06/21/2020   HGB 14.2 06/21/2020   HCT 41.2 06/21/2020   MCV 95.6 06/21/2020   PLT 303 06/21/2020   Lab Results  Component Value Date   NA 137 06/21/2020   K 3.9 06/21/2020   CO2 22 06/21/2020   GLUCOSE 111 (H) 06/21/2020   BUN 10 06/21/2020   CREATININE 0.66 06/21/2020   BILITOT 0.5 06/20/2020   ALKPHOS 57 06/20/2020   AST 28 06/20/2020   ALT 7 06/20/2020   PROT 7.6 06/20/2020   ALBUMIN 3.8 06/20/2020   CALCIUM 8.8 (L) 06/21/2020   ANIONGAP 11 06/21/2020   Lab Results  Component Value Date   CHOL 253 (H) 04/15/2020   Lab Results  Component Value Date   HDL 60  04/15/2020  Lab Results  Component Value Date   LDLCALC 170 (H) 04/15/2020   Lab Results  Component Value Date   TRIG 108 04/15/2020   Lab Results  Component Value Date   CHOLHDL 4.2 04/15/2020   No results found for: "HGBA1C"    Assessment & Plan:   Problem List Items Addressed This Visit   None   Meds ordered this encounter  Medications   traMADol (ULTRAM) 50 MG tablet    Sig: Take one tablet by mouth 3 times a day    Dispense:  90 tablet    Refill:  0    Follow-up: No follow-ups on file.    Cletis Athens, MD

## 2021-10-28 ENCOUNTER — Other Ambulatory Visit: Payer: Self-pay

## 2021-10-28 MED ORDER — TRAMADOL HCL 50 MG PO TABS
ORAL_TABLET | ORAL | 0 refills | Status: DC
  Filled 2021-10-28: qty 90, 30d supply, fill #0

## 2021-10-30 ENCOUNTER — Encounter: Payer: Self-pay | Admitting: Internal Medicine

## 2021-10-30 NOTE — Assessment & Plan Note (Signed)
-   I instructed the patient to stop smoking and provided them with smoking cessation materials.  - I informed the patient that smoking puts them at increased risk for cancer, COPD, hypertension, and more.  - Informed the patient to seek help if they begin to have trouble breathing, develop chest pain, start to cough up blood, feel faint, or pass out.  

## 2021-12-16 DIAGNOSIS — Z1211 Encounter for screening for malignant neoplasm of colon: Secondary | ICD-10-CM | POA: Diagnosis not present

## 2021-12-16 DIAGNOSIS — R21 Rash and other nonspecific skin eruption: Secondary | ICD-10-CM | POA: Diagnosis not present

## 2021-12-18 ENCOUNTER — Encounter: Payer: Self-pay | Admitting: Nurse Practitioner

## 2021-12-18 ENCOUNTER — Other Ambulatory Visit: Payer: Self-pay

## 2021-12-18 ENCOUNTER — Ambulatory Visit: Payer: 59 | Admitting: Nurse Practitioner

## 2021-12-18 VITALS — BP 119/62 | HR 90 | Temp 97.4°F | Ht 63.0 in | Wt 113.2 lb

## 2021-12-18 DIAGNOSIS — I10 Essential (primary) hypertension: Secondary | ICD-10-CM

## 2021-12-18 DIAGNOSIS — I7300 Raynaud's syndrome without gangrene: Secondary | ICD-10-CM

## 2021-12-18 MED ORDER — TRAMADOL HCL 50 MG PO TABS
50.0000 mg | ORAL_TABLET | Freq: Three times a day (TID) | ORAL | 0 refills | Status: DC
  Filled 2021-12-18: qty 90, 30d supply, fill #0

## 2021-12-18 NOTE — Progress Notes (Signed)
Established Patient Office Visit  Subjective:  Patient ID: Tracey Walters, female    DOB: 1960-04-06  Age: 61 y.o. MRN: [id number]  CC:  Chief Complaint  Patient presents with   Medication Refill    Needs refill of tramadol      HPI  Tracey Walters presents for refill of pain medication . She has h/of rheumatoid arthritis and Raynaud's. Tradomadol helps alleviate her symptoms.   HPI   Past Medical History:  Diagnosis Date   Anxiety    Carpal tunnel syndrome    right wrist   Dyspnea    GERD (gastroesophageal reflux disease)    Hyperlipidemia    Hypertension 20 yrs   Raynaud's phenomenon    Rheumatoid arteritis ([place])    Ulcer     Past Surgical History:  Procedure Laterality Date   ABDOMINAL HYSTERECTOMY  1992   BREAST BIOPSY Right 2011   neg   BREAST BIOPSY Right 11/1996   Dense fibrosis,  benign epithelial hyperplasia.   CARPAL TUNNEL RELEASE Right 2012   COLONOSCOPY  2010   ENDOBRONCHIAL ULTRASOUND Right 01/25/2018   Procedure: ENDOBRONCHIAL ULTRASOUND;  Surgeon: Tyler Pita, MD;  Location: [facility] ORS;  Service: Cardiopulmonary;  Laterality: Right;   TONSILLECTOMY     TUBAL LIGATION     UPPER GI ENDOSCOPY      Family History  Problem Relation Age of Onset   Breast cancer Sister 32   Breast cancer Maternal Aunt     Social History   Socioeconomic History   Marital status: Divorced    Spouse name: Not on file   Number of children: Not on file   Years of education: Not on file   Highest education level: Not on file  Occupational History   Not on file  Tobacco Use   Smoking status: Every Day    Packs/day: 1.50    Years: 42.00    Total pack years: 63.00    Types: Cigarettes   Smokeless tobacco: Never   Tobacco comments:    currently 1.5 packs/day  Vaping Use   Vaping Use: Never used  Substance and Sexual Activity   Alcohol use: No   Drug use: No   Sexual activity: Not on file  Other Topics Concern   Not on file  Social History  Narrative   Not on file   Social Determinants of Health   Financial Resource Strain: Not on file  Food Insecurity: Not on file  Transportation Needs: Not on file  Physical Activity: Not on file  Stress: Not on file  Social Connections: Not on file  Intimate Partner Violence: Not on file     Outpatient Medications Prior to Visit  Medication Sig Dispense Refill   amLODipine (NORVASC) 2.5 MG tablet Take 1 tablet (2.5 mg total) by mouth daily. 90 tablet 3   gabapentin (NEURONTIN) 300 MG capsule Take 300 mg by mouth daily.     pantoprazole (PROTONIX) 40 MG tablet Take 1 tablet (40 mg total) by mouth daily. 90 tablet 3   traMADol (ULTRAM) 50 MG tablet Take one tablet by mouth 3 times a day 90 tablet 0   traMADol (ULTRAM) 50 MG tablet Take one tablet by mouth 3 times a day 90 tablet 0   No facility-administered medications prior to visit.    Allergies  Allergen Reactions   Penicillins Hives    DID THE REACTION INVOLVE: Swelling of the face/tongue/throat, SOB, or low BP? No Sudden or severe rash/hives, skin peeling,  or the inside of the mouth or nose? No Did it require medical treatment? No When did it last happen?      unkn If all above answers are "NO", may proceed with cephalosporin use.     ROS Review of Systems  Constitutional: Negative.   HENT: Negative.    Eyes: Negative.   Respiratory:  Negative for chest tightness and shortness of breath.   Cardiovascular: Negative.   Gastrointestinal: Negative.   Genitourinary: Negative.   Musculoskeletal:  Positive for arthralgias and myalgias.  Skin: Negative.   Neurological: Negative.   Psychiatric/Behavioral: Negative.        Objective:    Physical Exam Constitutional:      Appearance: Normal appearance. She is obese.  HENT:     Head: Normocephalic.     Right Ear: Tympanic membrane normal.     Left Ear: Tympanic membrane normal.     Nose: Nose normal.     Mouth/Throat:     Mouth: Mucous membranes are moist.      Pharynx: Oropharynx is clear.  Eyes:     Extraocular Movements: Extraocular movements intact.     Conjunctiva/sclera: Conjunctivae normal.     Pupils: Pupils are equal, round, and reactive to light.  Cardiovascular:     Rate and Rhythm: Normal rate and regular rhythm.     Pulses: Normal pulses.     Heart sounds: Normal heart sounds.  Pulmonary:     Effort: Pulmonary effort is normal. No respiratory distress.     Breath sounds: Normal breath sounds. No rhonchi.  Abdominal:     General: Bowel sounds are normal.     Palpations: Abdomen is soft. There is no mass.     Tenderness: There is no abdominal tenderness.     Hernia: No hernia is present.  Musculoskeletal:        General: Normal range of motion.     Cervical back: Neck supple. No tenderness.  Skin:    General: Skin is warm.     Capillary Refill: Capillary refill takes less than 2 seconds.  Neurological:     General: No focal deficit present.     Mental Status: She is alert and oriented to person, place, and time. Mental status is at baseline.  Psychiatric:        Mood and Affect: Mood normal.        Behavior: Behavior normal.        Thought Content: Thought content normal.        Judgment: Judgment normal.     BP 119/62   Pulse 90   Temp (!) 97.4 F (36.3 C) (Temporal)   Ht '5\' 3"'$  (1.6 m)   Wt 113 lb 3.2 oz (51.3 kg)   SpO2 94%   BMI 20.05 kg/m  Wt Readings from Last 3 Encounters:  12/18/21 113 lb 3.2 oz (51.3 kg)  10/27/21 112 lb (50.8 kg)  08/25/21 114 lb 8 oz (51.9 kg)     Health Maintenance  Topic Date Due   PAP SMEAR-Modifier  Never done   COLONOSCOPY (Pts 45-29yr Insurance coverage will need to be confirmed)  Never done   Zoster Vaccines- Shingrix (1 of 2) Never done   COVID-19 Vaccine (4 - 2023-24 season) 09/12/2021   Hepatitis C Screening  10/28/2022 (Originally 05/04/1978)   Lung Cancer Screening  01/30/2022   MAMMOGRAM  12/03/2022   DTaP/Tdap/Td (2 - Td or Tdap) 10/29/2030   INFLUENZA VACCINE   Completed   HIV Screening  Completed   HPV VACCINES  Aged Out    There are no preventive care reminders to display for this patient.  Lab Results  Component Value Date   TSH 1.27 04/15/2020   Lab Results  Component Value Date   WBC 11.8 (H) 06/21/2020   HGB 14.2 06/21/2020   HCT 41.2 06/21/2020   MCV 95.6 06/21/2020   PLT 303 06/21/2020   Lab Results  Component Value Date   NA 137 06/21/2020   K 3.9 06/21/2020   CO2 22 06/21/2020   GLUCOSE 111 (H) 06/21/2020   BUN 10 06/21/2020   CREATININE 0.66 06/21/2020   BILITOT 0.5 06/20/2020   ALKPHOS 57 06/20/2020   AST 28 06/20/2020   ALT 7 06/20/2020   PROT 7.6 06/20/2020   ALBUMIN 3.8 06/20/2020   CALCIUM 8.8 (L) 06/21/2020   ANIONGAP 11 06/21/2020   Lab Results  Component Value Date   CHOL 253 (H) 04/15/2020   Lab Results  Component Value Date   HDL 60 04/15/2020   Lab Results  Component Value Date   LDLCALC 170 (H) 04/15/2020   Lab Results  Component Value Date   TRIG 108 04/15/2020   Lab Results  Component Value Date   CHOLHDL 4.2 04/15/2020   No results found for: "HGBA1C"    Assessment & Plan:   Problem List Items Addressed This Visit       Cardiovascular and Mediastinum   Raynaud's disease without gangrene - Primary    Stable on medication. Refilled tramadol.      Relevant Medications   traMADol (ULTRAM) 50 MG tablet     Meds ordered this encounter  Medications   traMADol (ULTRAM) 50 MG tablet    Sig: Take 1 tablet (50 mg total) by mouth 3 (three) times daily.    Dispense:  90 tablet    Refill:  0     Follow-up: No follow-ups on file.    Theresia Lo, NP

## 2021-12-19 ENCOUNTER — Encounter: Payer: Self-pay | Admitting: Nurse Practitioner

## 2021-12-19 NOTE — Assessment & Plan Note (Signed)
Stable on medication. Refilled tramadol.

## 2021-12-25 ENCOUNTER — Other Ambulatory Visit: Payer: Self-pay

## 2022-01-14 ENCOUNTER — Ambulatory Visit
Admission: RE | Admit: 2022-01-14 | Discharge: 2022-01-14 | Disposition: A | Discharge status: Home / Self Care | Payer: Commercial Managed Care - PPO | Source: Ambulatory Visit | Attending: General Surgery | Admitting: General Surgery

## 2022-01-14 DIAGNOSIS — Z1239 Encounter for other screening for malignant neoplasm of breast: Secondary | ICD-10-CM

## 2022-01-14 DIAGNOSIS — Z1231 Encounter for screening mammogram for malignant neoplasm of breast: Secondary | ICD-10-CM | POA: Insufficient documentation

## 2022-01-19 ENCOUNTER — Encounter: Payer: Self-pay | Admitting: Internal Medicine

## 2022-01-19 ENCOUNTER — Other Ambulatory Visit: Payer: Self-pay

## 2022-01-19 ENCOUNTER — Ambulatory Visit (INDEPENDENT_AMBULATORY_CARE_PROVIDER_SITE_OTHER): Payer: Commercial Managed Care - PPO | Admitting: Internal Medicine

## 2022-01-19 VITALS — BP 129/76 | HR 89 | Ht 63.0 in | Wt 113.0 lb

## 2022-01-19 DIAGNOSIS — I10 Essential (primary) hypertension: Secondary | ICD-10-CM

## 2022-01-19 DIAGNOSIS — F17200 Nicotine dependence, unspecified, uncomplicated: Secondary | ICD-10-CM

## 2022-01-19 DIAGNOSIS — F17210 Nicotine dependence, cigarettes, uncomplicated: Secondary | ICD-10-CM

## 2022-01-19 DIAGNOSIS — I7300 Raynaud's syndrome without gangrene: Secondary | ICD-10-CM

## 2022-01-19 DIAGNOSIS — M791 Myalgia, unspecified site: Secondary | ICD-10-CM | POA: Diagnosis not present

## 2022-01-19 MED ORDER — CLOTRIMAZOLE-BETAMETHASONE 1-0.05 % EX CREA
1.0000 | TOPICAL_CREAM | Freq: Two times a day (BID) | CUTANEOUS | 2 refills | Status: DC
  Filled 2022-01-19: qty 45, 23d supply, fill #0
  Filled 2022-02-07: qty 45, 23d supply, fill #1

## 2022-01-19 MED ORDER — TRAMADOL HCL 50 MG PO TABS: 50.0000 mg | ORAL_TABLET | Freq: Three times a day (TID) | ORAL | 0 refills | Status: DC

## 2022-01-19 NOTE — Assessment & Plan Note (Signed)
-   I instructed the patient to stop smoking and provided them with smoking cessation materials.  - I informed the patient that smoking puts them at increased risk for cancer, COPD, hypertension, and more.  - Informed the patient to seek help if they begin to have trouble breathing, develop chest pain, start to cough up blood, feel faint, or pass out.  

## 2022-01-19 NOTE — Progress Notes (Signed)
Established Patient Office Visit  Subjective:  Patient ID: Tracey Walters, female    DOB: 25-Sep-1960  Age: 62 y.o. MRN: [id number]  CC:  Chief Complaint  Patient presents with   Follow-up    HPI  Tracey Walters presents for check up  Past Medical History:  Diagnosis Date   Anxiety    Carpal tunnel syndrome    right wrist   Dyspnea    GERD (gastroesophageal reflux disease)    Hyperlipidemia    Hypertension 20 yrs   Raynaud's phenomenon    Rheumatoid arteritis ([place])    Ulcer     Past Surgical History:  Procedure Laterality Date   ABDOMINAL HYSTERECTOMY  1992   BREAST BIOPSY Right 2011   neg   BREAST BIOPSY Right 11/1996   Dense fibrosis,  benign epithelial hyperplasia.   CARPAL TUNNEL RELEASE Right 2012   COLONOSCOPY  2010   ENDOBRONCHIAL ULTRASOUND Right 01/25/2018   Procedure: ENDOBRONCHIAL ULTRASOUND;  Surgeon: Tyler Pita, MD;  Location: [facility] ORS;  Service: Cardiopulmonary;  Laterality: Right;   TONSILLECTOMY     TUBAL LIGATION     UPPER GI ENDOSCOPY      Family History  Problem Relation Age of Onset   Breast cancer Sister 45   Breast cancer Maternal Aunt     Social History   Socioeconomic History   Marital status: Divorced    Spouse name: Not on file   Number of children: Not on file   Years of education: Not on file   Highest education level: Not on file  Occupational History   Not on file  Tobacco Use   Smoking status: Every Day    Packs/day: 1.50    Years: 42.00    Total pack years: 63.00    Types: Cigarettes   Smokeless tobacco: Never   Tobacco comments:    currently 1.5 packs/day  Vaping Use   Vaping Use: Never used  Substance and Sexual Activity   Alcohol use: No   Drug use: No   Sexual activity: Not on file  Other Topics Concern   Not on file  Social History Narrative   Not on file   Social Determinants of Health   Financial Resource Strain: Not on file  Food Insecurity: Not on file  Transportation Needs: Not  on file  Physical Activity: Not on file  Stress: Not on file  Social Connections: Not on file  Intimate Partner Violence: Not on file     Current Outpatient Medications:    amLODipine (NORVASC) 2.5 MG tablet, Take 1 tablet (2.5 mg total) by mouth daily., Disp: 90 tablet, Rfl: 3   clotrimazole-betamethasone (LOTRISONE) cream, Apply 1 Application topically 2 (two) times daily., Disp: 45 g, Rfl: 2   gabapentin (NEURONTIN) 300 MG capsule, Take 300 mg by mouth daily., Disp: , Rfl:    pantoprazole (PROTONIX) 40 MG tablet, Take 1 tablet (40 mg total) by mouth daily., Disp: 90 tablet, Rfl: 3   traMADol (ULTRAM) 50 MG tablet, Take 1 tablet (50 mg total) by mouth 3 (three) times daily., Disp: 90 tablet, Rfl: 0   Allergies  Allergen Reactions   Penicillins Hives    DID THE REACTION INVOLVE: Swelling of the face/tongue/throat, SOB, or low BP? No Sudden or severe rash/hives, skin peeling, or the inside of the mouth or nose? No Did it require medical treatment? No When did it last happen?      unkn If all above answers are "NO", may proceed  with cephalosporin use.     ROS Review of Systems  Constitutional: Negative.   HENT: Negative.    Eyes: Negative.   Respiratory: Negative.    Cardiovascular: Negative.   Gastrointestinal: Negative.   Endocrine: Negative.   Genitourinary: Negative.   Musculoskeletal: Negative.   Skin: Negative.   Allergic/Immunologic: Negative.   Neurological: Negative.   Hematological: Negative.   Psychiatric/Behavioral: Negative.    All other systems reviewed and are negative.     Objective:    Physical Exam Vitals reviewed.  Constitutional:      Appearance: Normal appearance.  HENT:     Mouth/Throat:     Mouth: Mucous membranes are moist.  Eyes:     Pupils: Pupils are equal, round, and reactive to light.  Neck:     Vascular: No carotid bruit.  Cardiovascular:     Rate and Rhythm: Normal rate and regular rhythm.     Pulses: Normal pulses.     Heart  sounds: Normal heart sounds.  Pulmonary:     Effort: Pulmonary effort is normal.     Breath sounds: Normal breath sounds.  Abdominal:     General: Bowel sounds are normal.     Palpations: Abdomen is soft. There is no hepatomegaly, splenomegaly or mass.     Tenderness: There is no abdominal tenderness.     Hernia: No hernia is present.  Musculoskeletal:        General: No tenderness.     Cervical back: Neck supple.     Right lower leg: No edema.     Left lower leg: No edema.  Skin:    Findings: No rash.  Neurological:     Mental Status: She is alert and oriented to person, place, and time.     Motor: No weakness.  Psychiatric:        Mood and Affect: Mood and affect normal.        Behavior: Behavior normal.     BP 129/76   Pulse 89   Ht '5\' 3"'$  (1.6 m)   Wt 113 lb (51.3 kg)   SpO2 97%   BMI 20.02 kg/m  Wt Readings from Last 3 Encounters:  01/19/22 113 lb (51.3 kg)  12/18/21 113 lb 3.2 oz (51.3 kg)  10/27/21 112 lb (50.8 kg)     Health Maintenance Due  Topic Date Due   PAP SMEAR-Modifier  Never done   COLONOSCOPY (Pts 45-30yr Insurance coverage will need to be confirmed)  Never done   Zoster Vaccines- Shingrix (1 of 2) Never done   COVID-19 Vaccine (4 - 2023-24 season) 09/12/2021   Lung Cancer Screening  01/30/2022    There are no preventive care reminders to display for this patient.  Lab Results  Component Value Date   TSH 1.27 04/15/2020   Lab Results  Component Value Date   WBC 11.8 (H) 06/21/2020   HGB 14.2 06/21/2020   HCT 41.2 06/21/2020   MCV 95.6 06/21/2020   PLT 303 06/21/2020   Lab Results  Component Value Date   NA 137 06/21/2020   K 3.9 06/21/2020   CO2 22 06/21/2020   GLUCOSE 111 (H) 06/21/2020   BUN 10 06/21/2020   CREATININE 0.66 06/21/2020   BILITOT 0.5 06/20/2020   ALKPHOS 57 06/20/2020   AST 28 06/20/2020   ALT 7 06/20/2020   PROT 7.6 06/20/2020   ALBUMIN 3.8 06/20/2020   CALCIUM 8.8 (L) 06/21/2020   ANIONGAP 11 06/21/2020    Lab Results  Component  Value Date   CHOL 253 (H) 04/15/2020   Lab Results  Component Value Date   HDL 60 04/15/2020   Lab Results  Component Value Date   LDLCALC 170 (H) 04/15/2020   Lab Results  Component Value Date   TRIG 108 04/15/2020   Lab Results  Component Value Date   CHOLHDL 4.2 04/15/2020   No results found for: "HGBA1C"    Assessment & Plan:   Problem List Items Addressed This Visit       Cardiovascular and Mediastinum   Essential hypertension - Primary    Blood pressure labile but stable, she does not want to take any medicine      Raynaud's disease without gangrene    Stable at the present time      Relevant Medications   traMADol (ULTRAM) 50 MG tablet     Other   Smoker    - I instructed the patient to stop smoking and provided them with smoking cessation materials.  - I informed the patient that smoking puts them at increased risk for cancer, COPD, hypertension, and more.  - Informed the patient to seek help if they begin to have trouble breathing, develop chest pain, start to cough up blood, feel faint, or pass out.      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    Dispense:  90 tablet    Refill:  0   clotrimazole-betamethasone (LOTRISONE) cream    Sig: Apply 1 Application topically 2 (two) times daily.    Dispense:  45 g    Refill:  2    Follow-up: No follow-ups on file.    Cletis Athens, MD

## 2022-01-19 NOTE — Assessment & Plan Note (Signed)
Blood pressure labile but stable, she does not want to take any medicine

## 2022-01-19 NOTE — Assessment & Plan Note (Signed)
Stable at the present time. 

## 2022-01-19 NOTE — Assessment & Plan Note (Signed)
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

## 2022-01-20 ENCOUNTER — Other Ambulatory Visit: Payer: Self-pay

## 2022-01-20 MED ORDER — TRAMADOL HCL 50 MG PO TABS
50.0000 mg | ORAL_TABLET | Freq: Three times a day (TID) | ORAL | 0 refills | Status: DC
  Filled 2022-01-20: qty 90, 30d supply, fill #0

## 2022-01-30 ENCOUNTER — Ambulatory Visit
Admission: RE | Admit: 2022-01-30 | Discharge: 2022-01-30 | Disposition: A | Discharge status: Home / Self Care | Payer: Commercial Managed Care - PPO | Source: Ambulatory Visit | Attending: Acute Care | Admitting: Acute Care

## 2022-01-30 DIAGNOSIS — Z87891 Personal history of nicotine dependence: Secondary | ICD-10-CM | POA: Diagnosis not present

## 2022-01-30 DIAGNOSIS — F17210 Nicotine dependence, cigarettes, uncomplicated: Secondary | ICD-10-CM | POA: Diagnosis not present

## 2022-02-18 ENCOUNTER — Other Ambulatory Visit: Payer: Self-pay

## 2022-02-26 ENCOUNTER — Telehealth: Payer: Self-pay | Admitting: Acute Care

## 2022-02-26 NOTE — Telephone Encounter (Addendum)
I have attempted to call the patient with the results of their  Low Dose CT Chest Lung cancer screening scan. There was no answer. I have left a HIPPA compliant VM requesting the patient call the office for the scan results. I included the office contact information in the message. We will await her return call. If no return call we will continue to call until patient is contacted.    Tracey Walters, needs PET, Patient of Dr. Darnell Level. I will order once I have spoken with her. Thanks

## 2022-02-27 ENCOUNTER — Telehealth: Payer: Self-pay | Admitting: Acute Care

## 2022-02-27 NOTE — Telephone Encounter (Signed)
I have attempted to call the patient with the results of their  Low Dose CT Chest Lung cancer screening scan. There was no answer. I have left a HIPPA compliant VM requesting the patient call the office for the scan results. I included the office contact information in the message. We will await his return call. If no return call we will continue to call until patient is contacted.    ]Denise, we may need to send a letter requesting that she call us for results. She needs a PET scan and I do not want to wait to long before we get that scheduled. Thanks so much

## 2022-03-03 ENCOUNTER — Other Ambulatory Visit: Payer: Self-pay | Admitting: Acute Care

## 2022-03-03 ENCOUNTER — Other Ambulatory Visit: Payer: Self-pay

## 2022-03-03 ENCOUNTER — Telehealth: Payer: Commercial Managed Care - PPO | Admitting: Physician Assistant

## 2022-03-03 DIAGNOSIS — J019 Acute sinusitis, unspecified: Secondary | ICD-10-CM

## 2022-03-03 DIAGNOSIS — Z87891 Personal history of nicotine dependence: Secondary | ICD-10-CM

## 2022-03-03 DIAGNOSIS — R911 Solitary pulmonary nodule: Secondary | ICD-10-CM

## 2022-03-03 DIAGNOSIS — B9689 Other specified bacterial agents as the cause of diseases classified elsewhere: Secondary | ICD-10-CM | POA: Diagnosis not present

## 2022-03-03 DIAGNOSIS — I2510 Atherosclerotic heart disease of native coronary artery without angina pectoris: Secondary | ICD-10-CM

## 2022-03-03 MED ORDER — DOXYCYCLINE HYCLATE 100 MG PO TABS
100.0000 mg | ORAL_TABLET | Freq: Two times a day (BID) | ORAL | 0 refills | Status: DC
  Filled 2022-03-03: qty 20, 10d supply, fill #0

## 2022-03-03 MED ORDER — BENZONATATE 100 MG PO CAPS
100.0000 mg | ORAL_CAPSULE | Freq: Three times a day (TID) | ORAL | 0 refills | Status: DC | PRN
  Filled 2022-03-03: qty 30, 10d supply, fill #0

## 2022-03-03 MED ORDER — ALBUTEROL SULFATE HFA 108 (90 BASE) MCG/ACT IN AERS
1.0000 | INHALATION_SPRAY | Freq: Four times a day (QID) | RESPIRATORY_TRACT | 0 refills | Status: DC | PRN
  Filled 2022-03-03: qty 6.7, 25d supply, fill #0

## 2022-03-03 NOTE — Patient Instructions (Signed)
[name], thank you for joining Mar Daring, PA-C for today's virtual visit.  While this provider is not your primary care provider (PCP), if your PCP is located in our provider database this encounter information will be shared with them immediately following your visit.   [facility] account gives you access to today's visit and all your visits, tests, and labs performed at [facility] " click here if you don't have a [facility] account or go to [email or web address]  Consent: (Patient) Tracey Walters provided verbal consent for this virtual visit at the beginning of the encounter.  Current Medications:  Current Outpatient Medications:    albuterol (VENTOLIN HFA) 108 (90 Base) MCG/ACT inhaler, Inhale 1-2 puffs into the lungs every 6 (six) hours as needed., Disp: 18 g, Rfl: 0   benzonatate (TESSALON) 100 MG capsule, Take 1 capsule (100 mg total) by mouth 3 (three) times daily as needed., Disp: 30 capsule, Rfl: 0   doxycycline (VIBRA-TABS) 100 MG tablet, Take 1 tablet (100 mg total) by mouth 2 (two) times daily., Disp: 20 tablet, Rfl: 0   amLODipine (NORVASC) 2.5 MG tablet, Take 1 tablet (2.5 mg total) by mouth daily., Disp: 90 tablet, Rfl: 3   clotrimazole-betamethasone (LOTRISONE) cream, Apply 1 Application topically 2 (two) times daily., Disp: 45 g, Rfl: 2   gabapentin (NEURONTIN) 300 MG capsule, Take 300 mg by mouth daily., Disp: , Rfl:    pantoprazole (PROTONIX) 40 MG tablet, Take 1 tablet (40 mg total) by mouth daily., Disp: 90 tablet, Rfl: 3   traMADol (ULTRAM) 50 MG tablet, Take 1 tablet (50 mg total) by mouth 3 (three) times daily., Disp: 90 tablet, Rfl: 0   traMADol (ULTRAM) 50 MG tablet, Take 1 tablet (50 mg total) by mouth 3 (three) times daily., Disp: 90 tablet, Rfl: 0   Medications ordered in this encounter:  Meds ordered this encounter  Medications   doxycycline (VIBRA-TABS) 100 MG tablet    Sig: Take 1 tablet (100 mg  total) by mouth 2 (two) times daily.    Dispense:  20 tablet    Refill:  0    Order Specific Question:   Supervising Provider    Answer:   Chase Picket [id number]   benzonatate (TESSALON) 100 MG capsule    Sig: Take 1 capsule (100 mg total) by mouth 3 (three) times daily as needed.    Dispense:  30 capsule    Refill:  0    Order Specific Question:   Supervising Provider    Answer:   Bari Mantis   albuterol (VENTOLIN HFA) 108 (90 Base) MCG/ACT inhaler    Sig: Inhale 1-2 puffs into the lungs every 6 (six) hours as needed.    Dispense:  18 g    Refill:  0    Order Specific Question:   Supervising Provider    Answer:   Chase Picket [id number]     *If you need refills on other medications prior to your next appointment, please contact your pharmacy*  Follow-Up: Call back or seek an in-person evaluation if the symptoms worsen or if the condition fails to improve as anticipated.  [place] [phone]  Other Instructions  Sinus Infection, Adult A sinus infection, also called sinusitis, is inflammation of your sinuses. Sinuses are hollow spaces in the bones around your face. Your sinuses are located: Around your eyes. In the middle of your forehead. Behind your nose. In your cheekbones.  Mucus normally drains out of your sinuses. When your nasal tissues become inflamed or swollen, mucus can become trapped or blocked. This allows bacteria, viruses, and fungi to grow, which leads to infection. Most infections of the sinuses are caused by a virus. A sinus infection can develop quickly. It can last for up to 4 weeks (acute) or for more than 12 weeks (chronic). A sinus infection often develops after a cold. What are the causes? This condition is caused by anything that creates swelling in the sinuses or stops mucus from draining. This includes: Allergies. Asthma. Infection from bacteria or viruses. Deformities or blockages in your nose or  sinuses. Abnormal growths in the nose (nasal polyps). Pollutants, such as chemicals or irritants in the air. Infection from fungi. This is rare. What increases the risk? You are more likely to develop this condition if you: Have a weak body defense system (immune system). Do a lot of swimming or diving. Overuse nasal sprays. Smoke. What are the signs or symptoms? The main symptoms of this condition are pain and a feeling of pressure around the affected sinuses. Other symptoms include: Stuffy nose or congestion that makes it difficult to breathe through your nose. Thick yellow or greenish drainage from your nose. Tenderness, swelling, and warmth over the affected sinuses. A cough that may get worse at night. Decreased sense of smell and taste. Extra mucus that collects in the throat or the back of the nose (postnasal drip) causing a sore throat or bad breath. Tiredness (fatigue). Fever. How is this diagnosed? This condition is diagnosed based on: Your symptoms. Your medical history. A physical exam. Tests to find out if your condition is acute or chronic. This may include: Checking your nose for nasal polyps. Viewing your sinuses using a device that has a light (endoscope). Testing for allergies or bacteria. Imaging tests, such as an MRI or CT scan. In rare cases, a bone biopsy may be done to rule out more serious types of fungal sinus disease. How is this treated? Treatment for a sinus infection depends on the cause and whether your condition is chronic or acute. If caused by a virus, your symptoms should go away on their own within 10 days. You may be given medicines to relieve symptoms. They include: Medicines that shrink swollen nasal passages (decongestants). A spray that eases inflammation of the nostrils (topical intranasal corticosteroids). Rinses that help get rid of thick mucus in your nose (nasal saline washes). Medicines that treat allergies  (antihistamines). Over-the-counter pain relievers. If caused by bacteria, your health care provider may recommend waiting to see if your symptoms improve. Most bacterial infections will get better without antibiotic medicine. You may be given antibiotics if you have: A severe infection. A weak immune system. If caused by narrow nasal passages or nasal polyps, surgery may be needed. Follow these instructions at home: Medicines Take, use, or apply over-the-counter and prescription medicines only as told by your health care provider. These may include nasal sprays. If you were prescribed an antibiotic medicine, take it as told by your health care provider. Do not stop taking the antibiotic even if you start to feel better. Hydrate and humidify  Drink enough fluid to keep your urine pale yellow. Staying hydrated will help to thin your mucus. Use a cool mist humidifier to keep the humidity level in your home above 50%. Inhale steam for 10-15 minutes, 3-4 times a day, or as told by your health care provider. You can do this in the  bathroom while a hot shower is running. Limit your exposure to cool or dry air. Rest Rest as much as possible. Sleep with your head raised (elevated). Make sure you get enough sleep each night. General instructions  Apply a warm, moist washcloth to your face 3-4 times a day or as told by your health care provider. This will help with discomfort. Use nasal saline washes as often as told by your health care provider. Wash your hands often with soap and water to reduce your exposure to germs. If soap and water are not available, use hand sanitizer. Do not smoke. Avoid being around people who are smoking (secondhand smoke). Keep all follow-up visits. This is important. Contact a health care provider if: You have a fever. Your symptoms get worse. Your symptoms do not improve within 10 days. Get help right away if: You have a severe headache. You have persistent  vomiting. You have severe pain or swelling around your face or eyes. You have vision problems. You develop confusion. Your neck is stiff. You have trouble breathing. These symptoms may be an emergency. Get help right away. Call 911. Do not wait to see if the symptoms will go away. Do not drive yourself to the hospital. Summary A sinus infection is soreness and inflammation of your sinuses. Sinuses are hollow spaces in the bones around your face. This condition is caused by nasal tissues that become inflamed or swollen. The swelling traps or blocks the flow of mucus. This allows bacteria, viruses, and fungi to grow, which leads to infection. If you were prescribed an antibiotic medicine, take it as told by your health care provider. Do not stop taking the antibiotic even if you start to feel better. Keep all follow-up visits. This is important. This information is not intended to replace advice given to you by your health care provider. Make sure you discuss any questions you have with your health care provider. Document Revised: 12/03/2020 Document Reviewed: 12/03/2020 Elsevier Patient Education  [place].    If you have been instructed to have an in-person evaluation today at a local Urgent Care facility, please use the link below. It will take you to a list of all of our available [facility], including address, phone number and hours of operation. Please do not delay care.  [facility]  If you or a family member do not have a primary care provider, use the link below to schedule a visit and establish care. When you choose a [facility] primary care physician or advanced practice provider, you gain a long-term partner in health. Find a Primary Care Provider  Learn more about [facility]'s in-office and virtual care options: [facility]

## 2022-03-03 NOTE — Progress Notes (Signed)
Virtual Visit Consent   Tracey Walters, you are scheduled for a virtual visit with a [facility] provider today. Just as with appointments in the office, your consent must be obtained to participate. Your consent will be active for this visit and any virtual visit you may have with one of our providers in the next 365 days. If you have a MyChart account, a copy of this consent can be sent to you electronically.  As this is a virtual visit, video technology does not allow for your provider to perform a traditional examination. This may limit your provider's ability to fully assess your condition. If your provider identifies any concerns that need to be evaluated in person or the need to arrange testing (such as labs, EKG, etc.), we will make arrangements to do so. Although advances in technology are sophisticated, we cannot ensure that it will always work on either your end or our end. If the connection with a video visit is poor, the visit may have to be switched to a telephone visit. With either a video or telephone visit, we are not always able to ensure that we have a secure connection.  By engaging in this virtual visit, you consent to the provision of healthcare and authorize for your insurance to be billed (if applicable) for the services provided during this visit. Depending on your insurance coverage, you may receive a charge related to this service.  I need to obtain your verbal consent now. Are you willing to proceed with your visit today? Tracey Walters has provided verbal consent on 03/03/2022 for a virtual visit (video or telephone). Mar Daring, PA-C  Date: 03/03/2022 9:13 AM  Virtual Visit via Video Note   I, Mar Daring, connected with  Tracey Walters  ([id number], [birth year]) on 03/03/22 at  9:15 AM EST by a video-enabled telemedicine application and verified that I am speaking with the correct person using two identifiers.  Location: Patient: Virtual Visit  Location Patient: Home Provider: Virtual Visit Location Provider: Home Office   I discussed the limitations of evaluation and management by telemedicine and the availability of in person appointments. The patient expressed understanding and agreed to proceed.    History of Present Illness: Tracey Walters is a 62 y.o. who identifies as a female who was assigned female at birth, and is being seen today for cough and congestion.  HPI: Sinusitis This is a new problem. The current episode started in the past 7 days. The problem has been gradually worsening since onset. There has been no fever. The pain is moderate. Associated symptoms include congestion, coughing, headaches and sinus pressure. Pertinent negatives include no chills, ear pain, hoarse voice, shortness of breath or sore throat. (Rhinorrhea, post nasal drainage) Treatments tried: nasonex, afrin. The treatment provided no relief.     Problems:  Patient Active Problem List   Diagnosis Date Noted   Subarachnoid hemorrhage following injury, no loss of consciousness ([place]) 07/31/2020   Traumatic subarachnoid hemorrhage with loss of consciousness of 30 minutes or less ([place]) 07/31/2020   Myalgia 02/12/2020   Primary insomnia 11/26/2019   Smoker 08/21/2019   Simple chronic bronchitis ([place]) 08/11/2019   Essential hypertension 08/11/2019   Raynaud's disease without gangrene 08/11/2019   History of colonic polyps 11/11/2016   Personal history of tobacco use, presenting hazards to health 06/26/2016   Annual physical exam 10/11/2013   Encounter for screening mammogram for high-risk patient 08/31/2012    Allergies:  Allergies  Allergen  Reactions   Penicillins Hives    DID THE REACTION INVOLVE: Swelling of the face/tongue/throat, SOB, or low BP? No Sudden or severe rash/hives, skin peeling, or the inside of the mouth or nose? No Did it require medical treatment? No When did it last happen?      unkn If all above answers are "NO", may  proceed with cephalosporin use.    Medications:  Current Outpatient Medications:    albuterol (VENTOLIN HFA) 108 (90 Base) MCG/ACT inhaler, Inhale 1-2 puffs into the lungs every 6 (six) hours as needed., Disp: 18 g, Rfl: 0   benzonatate (TESSALON) 100 MG capsule, Take 1 capsule (100 mg total) by mouth 3 (three) times daily as needed., Disp: 30 capsule, Rfl: 0   doxycycline (VIBRA-TABS) 100 MG tablet, Take 1 tablet (100 mg total) by mouth 2 (two) times daily., Disp: 20 tablet, Rfl: 0   amLODipine (NORVASC) 2.5 MG tablet, Take 1 tablet (2.5 mg total) by mouth daily., Disp: 90 tablet, Rfl: 3   clotrimazole-betamethasone (LOTRISONE) cream, Apply 1 Application topically 2 (two) times daily., Disp: 45 g, Rfl: 2   gabapentin (NEURONTIN) 300 MG capsule, Take 300 mg by mouth daily., Disp: , Rfl:    pantoprazole (PROTONIX) 40 MG tablet, Take 1 tablet (40 mg total) by mouth daily., Disp: 90 tablet, Rfl: 3   traMADol (ULTRAM) 50 MG tablet, Take 1 tablet (50 mg total) by mouth 3 (three) times daily., Disp: 90 tablet, Rfl: 0   traMADol (ULTRAM) 50 MG tablet, Take 1 tablet (50 mg total) by mouth 3 (three) times daily., Disp: 90 tablet, Rfl: 0  Observations/Objective: Patient is well-developed, well-nourished in no acute distress.  Resting comfortably at home.  Head is normocephalic, atraumatic.  No labored breathing.  Speech is clear and coherent with logical content.  Patient is alert and oriented at baseline.    Assessment and Plan: 1. Acute bacterial sinusitis - doxycycline (VIBRA-TABS) 100 MG tablet; Take 1 tablet (100 mg total) by mouth 2 (two) times daily.  Dispense: 20 tablet; Refill: 0 - benzonatate (TESSALON) 100 MG capsule; Take 1 capsule (100 mg total) by mouth 3 (three) times daily as needed.  Dispense: 30 capsule; Refill: 0 - albuterol (VENTOLIN HFA) 108 (90 Base) MCG/ACT inhaler; Inhale 1-2 puffs into the lungs every 6 (six) hours as needed.  Dispense: 18 g; Refill: 0  - Worsening  symptoms that have not responded to OTC medications.  - Will give Doxycycline - Tessalon perles for cough - Albuterol for chronic bronchitis - Continue allergy medications.  - Steam and humidifier can help - Stay well hydrated and get plenty of rest.  - Seek in person evaluation if no symptom improvement or if symptoms worsen  Did advise patient that Pulmonology office has been trying to reach her as well so she can follow up with them regarding scheduling PET scan   Follow Up Instructions: I discussed the assessment and treatment plan with the patient. The patient was provided an opportunity to ask questions and all were answered. The patient agreed with the plan and demonstrated an understanding of the instructions.  A copy of instructions were sent to the patient via MyChart unless otherwise noted below.    The patient was advised to call back or seek an in-person evaluation if the symptoms worsen or if the condition fails to improve as anticipated.  Time:  I spent 10 minutes with the patient via telehealth technology discussing the above problems/concerns.    Mar Daring, PA-C

## 2022-03-03 NOTE — Progress Notes (Signed)
I have called the patient with the results of her low-dose screening CT.  I explained that her scan was read as a lung RADS 2, however as she has had some growth in an inferior right upper lobe nodule to 10.8 mm from 9.8 mm.  I have spoken with Dr. Patsey Berthold who has biopsied a lymph node in this patient in the past.  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  Patient is in agreement with the plan, we will schedule a 62-monthow-dose screening CT follow-up at that time.  I also discussed the notation of age advanced coronary artery disease with the patient.  She does have a significant family history of cardiac disease and currently does not have a primary care doctor. She would like a cardiac referral which I will place today. Additionally she would like [facility] she would like to remain in the [facility] primary care group.  Her [facility].  Patient wants to be part of a bigger group so there is cross coverage when needed.  Denise please place order for 6-91-monthllow-up low-dose screening CT. I have placed the order for cardiology referral and I am in the process of placing an [facility]

## 2022-03-26 ENCOUNTER — Other Ambulatory Visit: Payer: Self-pay

## 2022-03-27 ENCOUNTER — Other Ambulatory Visit: Payer: Self-pay

## 2022-03-27 MED ORDER — TRAMADOL HCL 50 MG PO TABS
50.0000 mg | ORAL_TABLET | Freq: Three times a day (TID) | ORAL | 0 refills | Status: DC
  Filled 2022-03-27: qty 90, 30d supply, fill #0

## 2022-04-21 ENCOUNTER — Other Ambulatory Visit: Payer: Self-pay

## 2022-05-04 NOTE — Progress Notes (Unsigned)
Cardiology Office Note  Date:  05/05/2022   ID:  Tracey, Walters October 05, 1960, MRN [id number]  PCP:  Tracey Downs, MD   Chief Complaint  Patient presents with   New Patient (Initial Visit)    Recent CT showed calcified coronary lesion.      HPI:  [name] is a 62 year old woman with past medical history of Smoking/emphysema, 2 ppd Raynaud's Essential hypertension Hyperlipidemia Coronary calcification and aortic atherosclerosis Who presents by referral from Tracey Walters for consultation of her coronary artery calcification  CT scan January 30, 2022 with coronary artery calcification, aortic atherosclerosis, moderate emphysema Images pulled up and reviewed Mild coronary calcification, mild to moderate diffuse aortic atherosclerosis noted  getting over URI, acute on chronic respiratory distress Still Some SOB,  Denies chest pain concerning for angina  Take BC's daily for arthritis  Lipid panel April 2022 Total cholesterol 253 LDL 170 Currently not on cholesterol medication  Family hx Father with CAD, CABG,  Sister with stress cardiomyopathy  PMH:   has a past medical history of Anxiety, Carpal tunnel syndrome, Dyspnea, GERD (gastroesophageal reflux disease), Hyperlipidemia, Hypertension (20 yrs), Raynaud's phenomenon, Rheumatoid arteritis, and Ulcer.  PSH:    Past Surgical History:  Procedure Laterality Date   ABDOMINAL HYSTERECTOMY  1992   BREAST BIOPSY Right 2011   neg   BREAST BIOPSY Right 11/1996   Dense fibrosis,  benign epithelial hyperplasia.   CARPAL TUNNEL RELEASE Right 2012   COLONOSCOPY  2010   ENDOBRONCHIAL ULTRASOUND Right 01/25/2018   Procedure: ENDOBRONCHIAL ULTRASOUND;  Surgeon: Tracey Saner, MD;  Location: [facility] ORS;  Service: Cardiopulmonary;  Laterality: Right;   TONSILLECTOMY     TUBAL LIGATION     UPPER GI ENDOSCOPY      Current Outpatient Medications  Medication Sig Dispense Refill   amLODipine (NORVASC) 2.5 MG tablet  Take 1 tablet (2.5 mg total) by mouth daily. 90 tablet 3   gabapentin (NEURONTIN) 300 MG capsule Take 300 mg by mouth daily.     pantoprazole (PROTONIX) 40 MG tablet Take 1 tablet (40 mg total) by mouth daily. 90 tablet 3   rosuvastatin (CRESTOR) 20 MG tablet Take 1 tablet (20 mg total) by mouth daily. 90 tablet 3   traMADol (ULTRAM) 50 MG tablet Take 1 tablet (50 mg total) by mouth 3 (three) times daily. 90 tablet 0   traMADol (ULTRAM) 50 MG tablet Take 1 tablet (50 mg total) by mouth 3 (three) times daily. 90 tablet 0   albuterol (VENTOLIN HFA) 108 (90 Base) MCG/ACT inhaler Inhale 1-2 puffs into the lungs every 6 (six) hours as needed. 6.7 g 0   benzonatate (TESSALON) 100 MG capsule Take 1 capsule (100 mg total) by mouth 3 (three) times daily as needed. 30 capsule 0   clotrimazole-betamethasone (LOTRISONE) cream Apply 1 Application topically 2 (two) times daily. 45 g 2   doxycycline (VIBRA-TABS) 100 MG tablet Take 1 tablet (100 mg total) by mouth 2 (two) times daily. 20 tablet 0   No current facility-administered medications for this visit.    Allergies:   Penicillins   Social History:  The patient  reports that she has been smoking cigarettes. She has a 63.00 pack-year smoking history. She has never used smokeless tobacco. She reports that she does not drink alcohol and does not use drugs.   Family History:   family history includes Atrial fibrillation in her mother; Breast cancer in her maternal aunt; Breast cancer (age of onset: 77) in her  sister; Heart attack in her father; Heart disease in her father and mother.    Review of Systems: Review of Systems  Constitutional: Negative.   HENT: Negative.    Respiratory: Negative.    Cardiovascular: Negative.   Gastrointestinal: Negative.   Musculoskeletal: Negative.   Neurological: Negative.   Psychiatric/Behavioral: Negative.    All other systems reviewed and are negative.    PHYSICAL EXAM: VS:  BP 130/70 (BP Location: Right Arm,  Patient Position: Sitting, Cuff Size: Normal)   Pulse 74   Ht  (1.6 m)   Wt 113 lb 12.8 oz (51.6 kg)   SpO2 94%   BMI 20.16 kg/m  , BMI Body mass index is 20.16 kg/m. GEN: Well nourished, well developed, in no acute distress HEENT: normal Neck: no JVD, carotid bruits, or masses Cardiac: RRR; no murmurs, rubs, or gallops,no edema  Respiratory:  clear to auscultation bilaterally, normal work of breathing GI: soft, nontender, nondistended, + BS MS: no deformity or atrophy Skin: warm and dry, no rash Neuro:  Strength and sensation are intact Psych: euthymic mood, full affect  Recent Labs: No results found for requested labs within last 365 days.    Lipid Panel Lab Results  Component Value Date   CHOL 253 (H) 04/15/2020   HDL 60 04/15/2020   LDLCALC 170 (H) 04/15/2020   TRIG 108 04/15/2020      Wt Readings from Last 3 Encounters:  05/05/22 113 lb 12.8 oz (51.6 kg)  01/19/22 113 lb (51.3 kg)  12/18/21 113 lb 3.2 oz (51.3 kg)       ASSESSMENT AND PLAN:  Problem List Items Addressed This Visit       Cardiology Problems   Essential hypertension   Relevant Medications   rosuvastatin (CRESTOR) 20 MG tablet   Other Relevant Orders   EKG 12-Lead     Other   Smoker   Other Visit Diagnoses     Coronary artery disease of native artery of native heart with stable angina pectoris    -  Primary   Relevant Medications   rosuvastatin (CRESTOR) 20 MG tablet   Murmur       Relevant Orders   ECHOCARDIOGRAM COMPLETE   Mixed hyperlipidemia       Relevant Medications   rosuvastatin (CRESTOR) 20 MG tablet   Centrilobular emphysema          Coronary disease Calcification noted on CT scan Denies anginal symptoms Stressed the importance of smoking cessation, management of cholesterol Will hold off on stress testing given lack of symptoms  Heart murmur Concerning for mitral valve or tricuspid valve disease Echocardiogram ordered  Smoker We have encouraged her to  continue to work on weaning her cigarettes and smoking cessation. She will continue to work on this and does not want any assistance with chantix.    Hyperlipidemia Recommend she start rosuvastatin 20 mg daily Goal LDL less than 70 given coronary disease and aortic atherosclerosis Follow-up lab work with primary care Last lipid panel 2 years ago  COPD/emphysema Monitored by primary care, annual CT screening Smoking cessation recommended  Aortic atherosclerosis Mild to moderate diffuse disease Smoking cessation recommended, aggressive lipid management recommended   Total encounter time more than 60 minutes  Greater than 50% was spent in counseling and coordination of care with the patient  Patient was seen in consultation for Sarah groce and will be referred back to her office for ongoing care of the issues detailed above  Signed, Dossie Arbour, M.D.,  Ph.D. [facility] [place], [place] [phone]

## 2022-05-05 ENCOUNTER — Encounter: Payer: Self-pay | Admitting: Cardiovascular Disease

## 2022-05-05 ENCOUNTER — Ambulatory Visit: Payer: Commercial Managed Care - PPO | Attending: Cardiovascular Disease | Admitting: Cardiovascular Disease

## 2022-05-05 ENCOUNTER — Other Ambulatory Visit: Payer: Self-pay

## 2022-05-05 VITALS — BP 130/70 | HR 74 | Ht 63.0 in | Wt 113.8 lb

## 2022-05-05 DIAGNOSIS — E782 Mixed hyperlipidemia: Secondary | ICD-10-CM | POA: Diagnosis not present

## 2022-05-05 DIAGNOSIS — I10 Essential (primary) hypertension: Secondary | ICD-10-CM

## 2022-05-05 DIAGNOSIS — J432 Centrilobular emphysema: Secondary | ICD-10-CM | POA: Diagnosis not present

## 2022-05-05 DIAGNOSIS — R011 Cardiac murmur, unspecified: Secondary | ICD-10-CM | POA: Diagnosis not present

## 2022-05-05 DIAGNOSIS — F17200 Nicotine dependence, unspecified, uncomplicated: Secondary | ICD-10-CM

## 2022-05-05 DIAGNOSIS — I25118 Atherosclerotic heart disease of native coronary artery with other forms of angina pectoris: Secondary | ICD-10-CM | POA: Diagnosis not present

## 2022-05-05 MED ORDER — ROSUVASTATIN CALCIUM 20 MG PO TABS
20.0000 mg | ORAL_TABLET | Freq: Every day | ORAL | 3 refills | Status: DC
  Filled 2022-05-05: qty 90, 90d supply, fill #0
  Filled 2022-08-05: qty 90, 90d supply, fill #1
  Filled 2022-11-02: qty 90, 90d supply, fill #2
  Filled 2023-02-01: qty 90, 90d supply, fill #3

## 2022-05-05 NOTE — Patient Instructions (Addendum)
Medication Instructions:  Please start crestor 20 mg daily  If you need a refill on your cardiac medications before your next appointment, please call your pharmacy.   Lab work: No new labs needed  Testing/Procedures: Echo for murmur, CAD  Your physician has requested that you have an echocardiogram. Echocardiography is a painless test that uses sound waves to create images of your heart. It provides your doctor with information about the size and shape of your heart and how well your heart's chambers and valves are working.   You may receive an ultrasound enhancing agent through an IV if needed to better visualize your heart during the echo. This procedure takes approximately one hour.  There are no restrictions for this procedure.  This will take place at [address] [address], [place] [address]   Follow-Up: At [facility], you and your health needs are our priority.  As part of our continuing mission to provide you with exceptional heart care, we have created designated Provider Care Teams.  These Care Teams include your primary Cardiologist (physician) and Advanced Practice Providers (APPs -  Physician Assistants and Nurse Practitioners) who all work together to provide you with the care you need, when you need it.  You will need a follow up appointment in 12 months  Providers on your designated Care Team:   Nicolasa Ducking, NP Eula Listen, PA-C Cadence Fransico Michael, [place]  COVID-19 Vaccine Information can be found at: [email or web address] For questions related to vaccine distribution or appointments, please email vaccine@[email or web address] or call [phone].

## 2022-05-12 ENCOUNTER — Other Ambulatory Visit: Payer: Self-pay

## 2022-05-12 MED ORDER — TRAMADOL HCL 50 MG PO TABS
50.0000 mg | ORAL_TABLET | Freq: Three times a day (TID) | ORAL | 0 refills | Status: AC
  Filled 2022-05-12: qty 20, 6d supply, fill #0
  Filled 2022-05-12: qty 70, 24d supply, fill #0

## 2022-05-14 ENCOUNTER — Ambulatory Visit: Payer: Commercial Managed Care - PPO | Attending: Cardiovascular Disease

## 2022-05-14 DIAGNOSIS — I083 Combined rheumatic disorders of mitral, aortic and tricuspid valves: Secondary | ICD-10-CM | POA: Diagnosis not present

## 2022-05-14 DIAGNOSIS — I5030 Unspecified diastolic (congestive) heart failure: Secondary | ICD-10-CM

## 2022-05-14 DIAGNOSIS — R011 Cardiac murmur, unspecified: Secondary | ICD-10-CM | POA: Diagnosis not present

## 2022-05-14 LAB — ECHOCARDIOGRAM COMPLETE
AR max vel: 2.56 cm2
AV Area VTI: 2.61 cm2
AV Area mean vel: 2.43 cm2
AV Mean grad: 3 mmHg
AV Peak grad: 5.5 mmHg
Ao pk vel: 1.18 m/s
Area-P 1/2: 3.6 cm2
Calc EF: 58.7 %
P 1/2 time: 465 msec
S' Lateral: 2.5 cm
Single Plane A2C EF: 56 %
Single Plane A4C EF: 61 %

## 2022-05-25 ENCOUNTER — Ambulatory Visit (INDEPENDENT_AMBULATORY_CARE_PROVIDER_SITE_OTHER): Payer: Commercial Managed Care - PPO

## 2022-05-25 ENCOUNTER — Ambulatory Visit
Admission: EM | Admit: 2022-05-25 | Discharge: 2022-05-25 | Disposition: A | Discharge status: Home / Self Care | Payer: Commercial Managed Care - PPO | Attending: Emergency Medicine | Admitting: Emergency Medicine

## 2022-05-25 DIAGNOSIS — M25431 Effusion, right wrist: Secondary | ICD-10-CM | POA: Diagnosis not present

## 2022-05-25 DIAGNOSIS — R600 Localized edema: Secondary | ICD-10-CM | POA: Diagnosis not present

## 2022-05-25 DIAGNOSIS — M25531 Pain in right wrist: Secondary | ICD-10-CM

## 2022-05-25 NOTE — Discharge Instructions (Addendum)
Take Tylenol or ibuprofen as directed.  Rest and elevate your wrist.  Apply ice packs 2-3 times a day for up to 20 minutes each.  Wear the wrist splint as needed for comfort.    Follow up with an orthopedist such as the one listed below.

## 2022-05-25 NOTE — ED Triage Notes (Addendum)
Patient to Urgent Care with complaints of right sided wrist pain. Swelling present. Reports pain worse when moving her thumb. States at times when she picks up items she feels like she is going to drop them because she can't hold onto it.  Symptoms started four days ago. Denies any known injury. 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 Hx of carpal tunnel surgery.

## 2022-05-25 NOTE — ED Provider Notes (Signed)
Tracey Walters    CSN: [id number] Arrival date & time: 05/25/22  1629      History   Chief Complaint Chief Complaint  Patient presents with   Wrist Pain    HPI Tracey Walters is a 62 y.o. female.  Patient presents with right wrist pain and swelling x 4 days.  The pain is worse with thumb movement.  It is hard to pick up and hold objects.  No wounds, numbness, fever, or other symptoms.  Treating with BC powder.  No known injury.  She uses her hands and wrists frequently at work.  She has history of carpal tunnel surgery on this wrist in 2012.  The history is provided by the patient and medical records.    Past Medical History:  Diagnosis Date   Anxiety    Carpal tunnel syndrome    right wrist   Dyspnea    GERD (gastroesophageal reflux disease)    Hyperlipidemia    Hypertension 20 yrs   Raynaud's phenomenon    Rheumatoid arteritis (HCC)    Ulcer     Patient Active Problem List   Diagnosis Date Noted   Subarachnoid hemorrhage following injury, no loss of consciousness (HCC) 07/31/2020   Traumatic subarachnoid hemorrhage with loss of consciousness of 30 minutes or less (HCC) 07/31/2020   Myalgia 02/12/2020   Primary insomnia 11/26/2019   Smoker 08/21/2019   Simple chronic bronchitis (HCC) 08/11/2019   Essential hypertension 08/11/2019   Raynaud's disease without gangrene 08/11/2019   History of colonic polyps 11/11/2016   Personal history of tobacco use, presenting hazards to health 06/26/2016   Annual physical exam 10/11/2013   Encounter for screening mammogram for high-risk patient 08/31/2012    Past Surgical History:  Procedure Laterality Date   ABDOMINAL HYSTERECTOMY  1992   BREAST BIOPSY Right 2011   neg   BREAST BIOPSY Right 11/1996   Dense fibrosis,  benign epithelial hyperplasia.   CARPAL TUNNEL RELEASE Right 2012   COLONOSCOPY  2010   ENDOBRONCHIAL ULTRASOUND Right 01/25/2018   Procedure: ENDOBRONCHIAL ULTRASOUND;  Surgeon: Salena Saner, MD;  Location: [facility] ORS;  Service: Cardiopulmonary;  Laterality: Right;   TONSILLECTOMY     TUBAL LIGATION     UPPER GI ENDOSCOPY      OB History     Gravida  1   Para  1   Term      Preterm      AB      Living  1      SAB      IAB      Ectopic      Multiple      Live Births           Obstetric Comments  1st Menstrual Cycle:  11 1st Pregnancy: 24          Home Medications    Prior to Admission medications   Medication Sig Start Date End Date Taking? Authorizing Provider  albuterol (VENTOLIN HFA) 108 (90 Base) MCG/ACT inhaler Inhale 1-2 puffs into the lungs every 6 (six) hours as needed. 03/03/22   Margaretann Loveless, PA-C  amLODipine (NORVASC) 2.5 MG tablet Take 1 tablet (2.5 mg total) by mouth daily. 10/27/21   Corky Downs, MD  benzonatate (TESSALON) 100 MG capsule Take 1 capsule (100 mg total) by mouth 3 (three) times daily as needed. 03/03/22   Margaretann Loveless, PA-C  clotrimazole-betamethasone (LOTRISONE) cream Apply 1 Application topically 2 (two) times daily. 01/19/22  Corky Downs, MD  doxycycline (VIBRA-TABS) 100 MG tablet Take 1 tablet (100 mg total) by mouth 2 (two) times daily. 03/03/22   Margaretann Loveless, PA-C  gabapentin (NEURONTIN) 300 MG capsule Take 300 mg by mouth daily.    [provider]  pantoprazole (PROTONIX) 40 MG tablet Take 1 tablet (40 mg total) by mouth daily. 06/23/21   Corky Downs, MD  rosuvastatin (CRESTOR) 20 MG tablet Take 1 tablet (20 mg total) by mouth daily. 05/05/22   Antonieta Iba, MD  traMADol (ULTRAM) 50 MG tablet Take 1 tablet (50 mg total) by mouth 3 (three) times daily. 01/19/22   Corky Downs, MD  traMADol (ULTRAM) 50 MG tablet Take 1 tablet (50 mg total) by mouth 3 (three) times daily for 90 doses. 05/11/22 06/11/22      Family History Family History  Problem Relation Age of Onset   Heart disease Mother    Atrial fibrillation Mother    Heart disease Father    Heart attack Father     Breast cancer Sister 10   Breast cancer Maternal Aunt     Social History Social History   Tobacco Use   Smoking status: Every Day    Packs/day: 1.50    Years: 42.00    Additional pack years: 0.00    Total pack years: 63.00    Types: Cigarettes   Smokeless tobacco: Never   Tobacco comments:    currently 1.5 packs/day  Vaping Use   Vaping Use: Never used  Substance Use Topics   Alcohol use: No   Drug use: No     Allergies   Penicillins   Review of Systems Review of Systems  Constitutional:  Negative for chills and fever.  Musculoskeletal:  Positive for arthralgias and joint swelling.  Skin:  Negative for color change, rash and wound.  Neurological:  Positive for weakness. Negative for numbness.     Physical Exam Triage Vital Signs ED Triage Vitals  Enc Vitals Group     BP --      Pulse Rate 05/25/22 1710 80     Resp 05/25/22 1710 18     Temp 05/25/22 1710 97.9 F (36.6 C)     Temp src --      SpO2 05/25/22 1710 94 %     Weight --      Height --      Head Circumference --      Peak Flow --      Pain Score 05/25/22 1726 5     Pain Loc --      Pain Edu? --      Excl. in GC? --    No data found.  Updated Vital Signs BP 132/87   Pulse 80   Temp 97.9 F (36.6 C)   Resp 18   SpO2 94%   Visual Acuity Right Eye Distance:   Left Eye Distance:   Bilateral Distance:    Right Eye Near:   Left Eye Near:    Bilateral Near:     Physical Exam Vitals and nursing note reviewed.  Constitutional:      General: She is not in acute distress.    Appearance: She is well-developed. She is not ill-appearing.  HENT:     Mouth/Throat:     Mouth: Mucous membranes are moist.  Cardiovascular:     Rate and Rhythm: Normal rate and regular rhythm.  Pulmonary:     Effort: Pulmonary effort is normal. No respiratory distress.  Musculoskeletal:        General: Swelling and tenderness present. No deformity. Normal range of motion.       Hands:     Cervical back:  Neck supple.  Skin:    General: Skin is warm and dry.     Capillary Refill: Capillary refill takes less than 2 seconds.     Findings: No bruising, erythema, lesion or rash.  Neurological:     General: No focal deficit present.     Mental Status: She is alert and oriented to person, place, and time.     Sensory: No sensory deficit.     Motor: No weakness.  Psychiatric:        Mood and Affect: Mood normal.        Behavior: Behavior normal.      UC Treatments / Results  Labs (all labs ordered are listed, but only abnormal results are displayed) Labs Reviewed - No data to display  EKG   Radiology DG Wrist Complete Right  Result Date: 05/25/2022 CLINICAL DATA:  Right wrist pain and swelling. EXAM: RIGHT WRIST - COMPLETE 3+ VIEW COMPARISON:  None Available. FINDINGS: The bones appear under mineralized. There is no evidence of fracture or dislocation. Minor osteoarthritis of the thumb at the carpal metacarpal joint. No erosions or periostitis. No focal bone abnormality. Minor generalized soft tissue edema. IMPRESSION: 1. 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 2. Mild generalized soft tissue edema. Electronically Signed   By: Narda Rutherford M.D.   On: 05/25/2022 17:51    Procedures Procedures (including critical care time)  Medications Ordered in UC Medications - No data to display  Initial Impression / Assessment and Plan / UC Course  I have reviewed the triage vital signs and the nursing notes.  Pertinent labs & imaging results that were available during my care of the patient were reviewed by me and considered in my medical decision making (see chart for details).    Pain and swelling of right wrist.  Xray negative for acute bony abnormality.  Treating with wrist splint, rest, elevation, ice packs, Tylenol or ibuprofen.  Education provided on wrist pain.  Instructed patient to follow-up with orthopedics.  Contact information for on-call Ortho provided.   Patient agrees to plan of care.   Final Clinical Impressions(s) / UC Diagnoses   Final diagnoses:  Pain and swelling of right wrist     Discharge Instructions      Take Tylenol or ibuprofen as directed.  Rest and elevate your wrist.  Apply ice packs 2-3 times a day for up to 20 minutes each.  Wear the wrist splint as needed for comfort.    Follow up with an orthopedist such as the one listed below.        ED Prescriptions   None    PDMP not reviewed this encounter.   Mickie Bail, NP 05/25/22 [phone]

## 2022-06-16 ENCOUNTER — Other Ambulatory Visit: Payer: Self-pay

## 2022-06-16 MED ORDER — TRAMADOL HCL 50 MG PO TABS
50.0000 mg | ORAL_TABLET | Freq: Three times a day (TID) | ORAL | 0 refills | Status: DC
  Filled 2022-06-16: qty 90, 30d supply, fill #0

## 2022-06-21 ENCOUNTER — Other Ambulatory Visit: Payer: Self-pay

## 2022-06-22 ENCOUNTER — Other Ambulatory Visit: Payer: Self-pay

## 2022-06-22 MED ORDER — PANTOPRAZOLE SODIUM 40 MG PO TBEC
40.0000 mg | DELAYED_RELEASE_TABLET | Freq: Every day | ORAL | 1 refills | Status: DC
  Filled 2022-06-22: qty 90, 90d supply, fill #0
  Filled 2022-09-29: qty 90, 90d supply, fill #1

## 2022-07-13 ENCOUNTER — Other Ambulatory Visit: Payer: Self-pay

## 2022-07-13 DIAGNOSIS — M791 Myalgia, unspecified site: Secondary | ICD-10-CM | POA: Diagnosis not present

## 2022-07-13 DIAGNOSIS — I10 Essential (primary) hypertension: Secondary | ICD-10-CM | POA: Diagnosis not present

## 2022-07-13 DIAGNOSIS — M654 Radial styloid tenosynovitis [de Quervain]: Secondary | ICD-10-CM | POA: Diagnosis not present

## 2022-07-13 DIAGNOSIS — F17200 Nicotine dependence, unspecified, uncomplicated: Secondary | ICD-10-CM | POA: Diagnosis not present

## 2022-07-13 DIAGNOSIS — I7300 Raynaud's syndrome without gangrene: Secondary | ICD-10-CM | POA: Diagnosis not present

## 2022-07-13 MED ORDER — TRIAMCINOLONE ACETONIDE 0.1 % EX CREA
TOPICAL_CREAM | Freq: Two times a day (BID) | CUTANEOUS | 0 refills | Status: DC
  Filled 2022-07-13: qty 80, 14d supply, fill #0

## 2022-07-13 MED ORDER — TRIAMCINOLONE ACETONIDE 0.5 % EX CREA
1.0000 | TOPICAL_CREAM | Freq: Two times a day (BID) | CUTANEOUS | 0 refills | Status: DC
  Filled 2022-07-13: qty 15, 8d supply, fill #0

## 2022-07-14 ENCOUNTER — Other Ambulatory Visit: Payer: Self-pay

## 2022-07-14 DIAGNOSIS — M654 Radial styloid tenosynovitis [de Quervain]: Secondary | ICD-10-CM | POA: Diagnosis not present

## 2022-07-24 ENCOUNTER — Other Ambulatory Visit: Payer: Self-pay

## 2022-07-24 MED ORDER — TRAMADOL HCL 50 MG PO TABS
50.0000 mg | ORAL_TABLET | Freq: Three times a day (TID) | ORAL | 0 refills | Status: DC
  Filled 2022-07-24: qty 90, 30d supply, fill #0

## 2022-07-29 ENCOUNTER — Other Ambulatory Visit: Payer: Self-pay

## 2022-07-29 DIAGNOSIS — M654 Radial styloid tenosynovitis [de Quervain]: Secondary | ICD-10-CM | POA: Diagnosis not present

## 2022-07-29 MED ORDER — MELOXICAM 15 MG PO TABS
15.0000 mg | ORAL_TABLET | Freq: Every day | ORAL | 2 refills | Status: DC
  Filled 2022-07-29: qty 90, 90d supply, fill #0
  Filled 2022-11-02: qty 90, 90d supply, fill #1

## 2022-07-31 ENCOUNTER — Ambulatory Visit
Admission: RE | Admit: 2022-07-31 | Discharge: 2022-07-31 | Disposition: A | Discharge status: Home / Self Care | Payer: Commercial Managed Care - PPO | Source: Ambulatory Visit | Attending: Acute Care | Admitting: Acute Care

## 2022-07-31 DIAGNOSIS — Z87891 Personal history of nicotine dependence: Secondary | ICD-10-CM | POA: Insufficient documentation

## 2022-07-31 DIAGNOSIS — R911 Solitary pulmonary nodule: Secondary | ICD-10-CM | POA: Insufficient documentation

## 2022-07-31 DIAGNOSIS — F17210 Nicotine dependence, cigarettes, uncomplicated: Secondary | ICD-10-CM | POA: Diagnosis not present

## 2022-08-11 ENCOUNTER — Other Ambulatory Visit: Payer: Self-pay

## 2022-08-11 DIAGNOSIS — F17210 Nicotine dependence, cigarettes, uncomplicated: Secondary | ICD-10-CM

## 2022-08-11 DIAGNOSIS — Z122 Encounter for screening for malignant neoplasm of respiratory organs: Secondary | ICD-10-CM

## 2022-08-11 DIAGNOSIS — Z87891 Personal history of nicotine dependence: Secondary | ICD-10-CM

## 2022-09-07 DIAGNOSIS — I609 Nontraumatic subarachnoid hemorrhage, unspecified: Secondary | ICD-10-CM | POA: Diagnosis not present

## 2022-09-07 DIAGNOSIS — M654 Radial styloid tenosynovitis [de Quervain]: Secondary | ICD-10-CM | POA: Diagnosis not present

## 2022-09-07 DIAGNOSIS — F17200 Nicotine dependence, unspecified, uncomplicated: Secondary | ICD-10-CM | POA: Diagnosis not present

## 2022-09-07 DIAGNOSIS — Z0000 Encounter for general adult medical examination without abnormal findings: Secondary | ICD-10-CM | POA: Diagnosis not present

## 2022-09-07 DIAGNOSIS — G4700 Insomnia, unspecified: Secondary | ICD-10-CM | POA: Diagnosis not present

## 2022-09-07 DIAGNOSIS — R21 Rash and other nonspecific skin eruption: Secondary | ICD-10-CM | POA: Diagnosis not present

## 2022-09-07 DIAGNOSIS — J42 Unspecified chronic bronchitis: Secondary | ICD-10-CM | POA: Diagnosis not present

## 2022-09-07 DIAGNOSIS — M791 Myalgia, unspecified site: Secondary | ICD-10-CM | POA: Diagnosis not present

## 2022-09-07 DIAGNOSIS — K219 Gastro-esophageal reflux disease without esophagitis: Secondary | ICD-10-CM | POA: Diagnosis not present

## 2022-09-07 DIAGNOSIS — I7300 Raynaud's syndrome without gangrene: Secondary | ICD-10-CM | POA: Diagnosis not present

## 2022-09-07 DIAGNOSIS — Z86010 Personal history of colonic polyps: Secondary | ICD-10-CM | POA: Diagnosis not present

## 2022-09-08 ENCOUNTER — Other Ambulatory Visit: Payer: Self-pay

## 2022-09-08 ENCOUNTER — Other Ambulatory Visit
Admission: RE | Admit: 2022-09-08 | Discharge: 2022-09-08 | Disposition: A | Discharge status: Home / Self Care | Payer: Commercial Managed Care - PPO | Attending: Internal Medicine | Admitting: Internal Medicine

## 2022-09-08 DIAGNOSIS — Z0001 Encounter for general adult medical examination with abnormal findings: Secondary | ICD-10-CM | POA: Insufficient documentation

## 2022-09-08 LAB — CBC
HCT: 42.7 % (ref 36.0–46.0)
Hemoglobin: 14.1 g/dL (ref 12.0–15.0)
MCH: 32 pg (ref 26.0–34.0)
MCHC: 33 g/dL (ref 30.0–36.0)
MCV: 96.8 fL (ref 80.0–100.0)
Platelets: 335 10*3/uL (ref 150–400)
RBC: 4.41 MIL/uL (ref 3.87–5.11)
RDW: 15 % (ref 11.5–15.5)
WBC: 7.9 10*3/uL (ref 4.0–10.5)
nRBC: 0 % (ref 0.0–0.2)

## 2022-09-08 LAB — COMPREHENSIVE METABOLIC PANEL
ALT: 7 U/L (ref 0–44)
AST: 21 U/L (ref 15–41)
Albumin: 3.6 g/dL (ref 3.5–5.0)
Alkaline Phosphatase: 59 U/L (ref 38–126)
Anion gap: 9 (ref 5–15)
BUN: 13 mg/dL (ref 8–23)
CO2: 25 mmol/L (ref 22–32)
Calcium: 8.7 mg/dL — ABNORMAL LOW (ref 8.9–10.3)
Chloride: 107 mmol/L (ref 98–111)
Creatinine, Ser: 0.77 mg/dL (ref 0.44–1.00)
GFR, Estimated: >60 mL/min (ref >60)
Glucose, Bld: 104 mg/dL — ABNORMAL HIGH (ref 70–99)
Potassium: 3.5 mmol/L (ref 3.5–5.1)
Sodium: 141 mmol/L (ref 135–145)
Total Bilirubin: 0.2 mg/dL — ABNORMAL LOW (ref 0.3–1.2)
Total Protein: 6.9 g/dL (ref 6.5–8.1)

## 2022-09-08 LAB — LIPID PANEL
Cholesterol: 136 mg/dL (ref 0–200)
HDL: 55 mg/dL (ref >40)
LDL Cholesterol: 63 mg/dL (ref 0–99)
Total CHOL/HDL Ratio: 2.5 RATIO
Triglycerides: 88 mg/dL (ref <150)
VLDL: 18 mg/dL (ref 0–40)

## 2022-09-08 LAB — TSH: TSH: 4.095 u[IU]/mL (ref 0.350–4.500)

## 2022-09-08 MED ORDER — TRAMADOL HCL 50 MG PO TABS
50.0000 mg | ORAL_TABLET | Freq: Three times a day (TID) | ORAL | 0 refills | Status: DC
  Filled 2022-09-08: qty 90, 30d supply, fill #0

## 2022-11-02 DIAGNOSIS — I7300 Raynaud's syndrome without gangrene: Secondary | ICD-10-CM | POA: Diagnosis not present

## 2022-11-02 DIAGNOSIS — K219 Gastro-esophageal reflux disease without esophagitis: Secondary | ICD-10-CM | POA: Diagnosis not present

## 2022-11-02 DIAGNOSIS — G4700 Insomnia, unspecified: Secondary | ICD-10-CM | POA: Diagnosis not present

## 2022-11-03 ENCOUNTER — Other Ambulatory Visit: Payer: Self-pay | Admitting: Internal Medicine

## 2022-11-03 ENCOUNTER — Other Ambulatory Visit: Payer: Self-pay

## 2022-11-03 DIAGNOSIS — I10 Essential (primary) hypertension: Secondary | ICD-10-CM

## 2022-11-03 MED ORDER — TRAMADOL HCL 50 MG PO TABS
50.0000 mg | ORAL_TABLET | Freq: Three times a day (TID) | ORAL | 0 refills | Status: DC
Start: 2022-11-02 — End: 2022-12-15
  Filled 2022-11-03: qty 90, 30d supply, fill #0

## 2022-11-04 ENCOUNTER — Other Ambulatory Visit: Payer: Self-pay

## 2022-11-04 MED ORDER — AMLODIPINE BESYLATE 2.5 MG PO TABS
2.5000 mg | ORAL_TABLET | Freq: Every day | ORAL | 1 refills | Status: DC
  Filled 2022-11-04: qty 90, 90d supply, fill #0
  Filled 2023-02-01: qty 90, 90d supply, fill #1

## 2022-11-12 ENCOUNTER — Other Ambulatory Visit: Payer: Self-pay | Admitting: General Surgery

## 2022-11-12 DIAGNOSIS — Z1231 Encounter for screening mammogram for malignant neoplasm of breast: Secondary | ICD-10-CM

## 2022-12-07 DIAGNOSIS — K219 Gastro-esophageal reflux disease without esophagitis: Secondary | ICD-10-CM | POA: Diagnosis not present

## 2022-12-07 DIAGNOSIS — G4700 Insomnia, unspecified: Secondary | ICD-10-CM | POA: Diagnosis not present

## 2022-12-07 DIAGNOSIS — M654 Radial styloid tenosynovitis [de Quervain]: Secondary | ICD-10-CM | POA: Diagnosis not present

## 2022-12-07 DIAGNOSIS — I609 Nontraumatic subarachnoid hemorrhage, unspecified: Secondary | ICD-10-CM | POA: Diagnosis not present

## 2022-12-07 DIAGNOSIS — I7300 Raynaud's syndrome without gangrene: Secondary | ICD-10-CM | POA: Diagnosis not present

## 2022-12-15 ENCOUNTER — Other Ambulatory Visit: Payer: Self-pay

## 2022-12-15 MED ORDER — TRAMADOL HCL 50 MG PO TABS
50.0000 mg | ORAL_TABLET | Freq: Three times a day (TID) | ORAL | 0 refills | Status: DC
  Filled 2022-12-15: qty 90, 30d supply, fill #0

## 2022-12-28 ENCOUNTER — Other Ambulatory Visit: Payer: Self-pay

## 2022-12-28 DIAGNOSIS — F17200 Nicotine dependence, unspecified, uncomplicated: Secondary | ICD-10-CM | POA: Diagnosis not present

## 2022-12-28 DIAGNOSIS — G4700 Insomnia, unspecified: Secondary | ICD-10-CM | POA: Diagnosis not present

## 2022-12-28 DIAGNOSIS — K219 Gastro-esophageal reflux disease without esophagitis: Secondary | ICD-10-CM | POA: Diagnosis not present

## 2022-12-28 DIAGNOSIS — I609 Nontraumatic subarachnoid hemorrhage, unspecified: Secondary | ICD-10-CM | POA: Diagnosis not present

## 2022-12-28 DIAGNOSIS — I7300 Raynaud's syndrome without gangrene: Secondary | ICD-10-CM | POA: Diagnosis not present

## 2022-12-28 DIAGNOSIS — I10 Essential (primary) hypertension: Secondary | ICD-10-CM | POA: Diagnosis not present

## 2022-12-28 DIAGNOSIS — M654 Radial styloid tenosynovitis [de Quervain]: Secondary | ICD-10-CM | POA: Diagnosis not present

## 2022-12-28 MED ORDER — PANTOPRAZOLE SODIUM 40 MG PO TBEC
40.0000 mg | DELAYED_RELEASE_TABLET | Freq: Every day | ORAL | 3 refills | Status: DC
  Filled 2022-12-28: qty 90, 90d supply, fill #0
  Filled 2023-04-01: qty 90, 90d supply, fill #1
  Filled 2023-07-01: qty 90, 90d supply, fill #2

## 2023-01-19 ENCOUNTER — Ambulatory Visit
Admission: RE | Admit: 2023-01-19 | Discharge: 2023-01-19 | Disposition: A | Discharge status: Home / Self Care | Payer: Commercial Managed Care - PPO | Source: Ambulatory Visit | Attending: General Surgery | Admitting: General Surgery

## 2023-01-19 ENCOUNTER — Other Ambulatory Visit: Payer: Self-pay

## 2023-01-19 DIAGNOSIS — Z1231 Encounter for screening mammogram for malignant neoplasm of breast: Secondary | ICD-10-CM | POA: Diagnosis not present

## 2023-01-19 MED ORDER — TRAMADOL HCL 50 MG PO TABS
50.0000 mg | ORAL_TABLET | Freq: Three times a day (TID) | ORAL | 0 refills | Status: DC
Start: 2022-12-28 — End: 2023-03-08
  Filled 2023-01-19: qty 90, 30d supply, fill #0

## 2023-01-26 DIAGNOSIS — Z1231 Encounter for screening mammogram for malignant neoplasm of breast: Secondary | ICD-10-CM | POA: Diagnosis not present

## 2023-03-08 ENCOUNTER — Other Ambulatory Visit: Payer: Self-pay

## 2023-03-08 MED ORDER — TRAMADOL HCL 50 MG PO TABS
50.0000 mg | ORAL_TABLET | Freq: Three times a day (TID) | ORAL | 0 refills | Status: DC
  Filled 2023-03-08: qty 90, 30d supply, fill #0

## 2023-03-09 ENCOUNTER — Other Ambulatory Visit: Payer: Self-pay

## 2023-03-09 MED ORDER — TRAMADOL HCL 50 MG PO TABS
50.0000 mg | ORAL_TABLET | Freq: Three times a day (TID) | ORAL | 0 refills | Status: DC
  Filled 2023-03-09: qty 90, 30d supply, fill #0

## 2023-04-28 ENCOUNTER — Other Ambulatory Visit: Payer: Self-pay

## 2023-04-28 ENCOUNTER — Other Ambulatory Visit: Payer: Self-pay | Admitting: Cardiovascular Disease

## 2023-04-28 MED ORDER — ROSUVASTATIN CALCIUM 20 MG PO TABS
20.0000 mg | ORAL_TABLET | Freq: Every day | ORAL | 3 refills | Status: AC
  Filled 2023-04-28: qty 90, 90d supply, fill #0
  Filled 2023-08-02: qty 90, 90d supply, fill #1
  Filled 2023-11-03: qty 90, 90d supply, fill #2
  Filled 2024-01-29: qty 90, 90d supply, fill #3

## 2023-04-29 ENCOUNTER — Other Ambulatory Visit: Payer: Self-pay

## 2023-05-03 ENCOUNTER — Other Ambulatory Visit: Payer: Self-pay

## 2023-05-04 ENCOUNTER — Other Ambulatory Visit: Payer: Self-pay

## 2023-05-05 ENCOUNTER — Other Ambulatory Visit: Payer: Self-pay

## 2023-05-10 ENCOUNTER — Other Ambulatory Visit: Payer: Self-pay

## 2023-05-11 ENCOUNTER — Other Ambulatory Visit: Payer: Self-pay

## 2023-05-13 ENCOUNTER — Other Ambulatory Visit: Payer: Self-pay | Admitting: Cardiovascular Disease

## 2023-05-13 ENCOUNTER — Other Ambulatory Visit: Payer: Self-pay

## 2023-05-14 ENCOUNTER — Other Ambulatory Visit: Payer: Self-pay

## 2023-05-14 MED FILL — Amlodipine Besylate Tab 2.5 MG (Base Equivalent): ORAL | 90 days supply | Qty: 90 | Fill #0 | Status: AC

## 2023-07-06 ENCOUNTER — Encounter: Payer: Self-pay | Admitting: Nurse Practitioner

## 2023-07-06 ENCOUNTER — Other Ambulatory Visit: Payer: Self-pay

## 2023-07-06 ENCOUNTER — Ambulatory Visit: Admitting: Nurse Practitioner

## 2023-07-06 VITALS — BP 124/72 | HR 89 | Temp 97.6°F | Resp 16 | Ht 63.0 in | Wt 110.6 lb

## 2023-07-06 DIAGNOSIS — M79604 Pain in right leg: Secondary | ICD-10-CM | POA: Diagnosis not present

## 2023-07-06 DIAGNOSIS — K219 Gastro-esophageal reflux disease without esophagitis: Secondary | ICD-10-CM

## 2023-07-06 DIAGNOSIS — E782 Mixed hyperlipidemia: Secondary | ICD-10-CM | POA: Diagnosis not present

## 2023-07-06 DIAGNOSIS — M79605 Pain in left leg: Secondary | ICD-10-CM | POA: Diagnosis not present

## 2023-07-06 DIAGNOSIS — E559 Vitamin D deficiency, unspecified: Secondary | ICD-10-CM

## 2023-07-06 DIAGNOSIS — E538 Deficiency of other specified B group vitamins: Secondary | ICD-10-CM

## 2023-07-06 DIAGNOSIS — Z833 Family history of diabetes mellitus: Secondary | ICD-10-CM | POA: Diagnosis not present

## 2023-07-06 DIAGNOSIS — I10 Essential (primary) hypertension: Secondary | ICD-10-CM | POA: Diagnosis not present

## 2023-07-06 DIAGNOSIS — Z803 Family history of malignant neoplasm of breast: Secondary | ICD-10-CM

## 2023-07-06 MED ORDER — TRAMADOL HCL 50 MG PO TABS
50.0000 mg | ORAL_TABLET | Freq: Two times a day (BID) | ORAL | 2 refills | Status: DC | PRN
  Filled 2023-07-06: qty 60, 30d supply, fill #0
  Filled 2023-08-25: qty 60, 30d supply, fill #1
  Filled 2023-11-03: qty 60, 30d supply, fill #2

## 2023-07-06 MED ORDER — AMLODIPINE BESYLATE 2.5 MG PO TABS
2.5000 mg | ORAL_TABLET | Freq: Every day | ORAL | 3 refills | Status: AC
  Filled 2023-07-06 – 2023-08-02 (×2): qty 90, 90d supply, fill #0
  Filled 2023-11-03: qty 90, 90d supply, fill #1
  Filled 2024-02-01: qty 90, 90d supply, fill #2

## 2023-07-06 MED ORDER — PANTOPRAZOLE SODIUM 40 MG PO TBEC
40.0000 mg | DELAYED_RELEASE_TABLET | Freq: Every day | ORAL | 3 refills | Status: AC
  Filled 2023-07-06 – 2023-09-29 (×2): qty 90, 90d supply, fill #0
  Filled 2023-12-27: qty 90, 90d supply, fill #1

## 2023-07-06 NOTE — Progress Notes (Signed)
 [facility] [address] [place], [place] [address]  Internal MEDICINE  Office Visit Note  Patient Name: Tracey Walters  [id number]  [id number]  Date of Service: 07/06/2023   Complaints/HPI Pt is here for establishment of PCP. Chief Complaint  Patient presents with   New Patient (Initial Visit)    Med refilled     HPI Tracey Walters presents for a new patient visit to establish care.  Well-appearing 63 y.o. 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  Alcohol use: none  Illicit drug use: none  Routine CRC screening: cologuard done last year, was negative  Routine mammogram: routine mammogram done in January.  DEXA scan: unsure.  Pap smear: has had a vaginal hysterectomy.  Eye exam and/or foot exam: Labs:  New or worsening pain: chronic pain of legs and feet.  Routine yearly lung cancer screening with [facility] sarah groce NP, sees Dr. T[facility] -- Dr. Gollan    Current Medication: Outpatient Encounter Medications as of 07/06/2023  Medication Sig   albuterol  (VENTOLIN  HFA) 108 (90 Base) MCG/ACT inhaler Inhale 1-2 puffs into the lungs every 6 (six) hours as needed.   amLODipine  (NORVASC ) 2.5 MG tablet Take 1 tablet (2.5 mg total) by mouth daily.   meloxicam  (MOBIC ) 15 MG tablet Take 1 tablet (15 mg total) by mouth daily.   pantoprazole  (PROTONIX ) 40 MG tablet Take 1 tablet (40 mg total) by mouth daily.   rosuvastatin  (CRESTOR ) 20 MG tablet Take 1 tablet (20 mg total) by mouth daily.   traMADol  (ULTRAM ) 50 MG tablet Take 1 tablet (50 mg total) by mouth 2 (two) times daily as needed for moderate pain (pain score 4-6) or severe pain (pain score 7-10).   [DISCONTINUED] amLODipine  (NORVASC ) 2.5 MG tablet Take 1 tablet (2.5 mg total) by mouth  daily.   [DISCONTINUED] benzonatate  (TESSALON ) 100 MG capsule Take 1 capsule (100 mg total) by mouth 3 (three) times daily as needed.   [DISCONTINUED] clotrimazole -betamethasone  (LOTRISONE ) cream Apply 1 Application topically 2 (two) times daily.   [DISCONTINUED] doxycycline  (VIBRA -TABS) 100 MG tablet Take 1 tablet (100 mg total) by mouth 2 (two) times daily.   [DISCONTINUED] gabapentin (NEURONTIN) 300 MG capsule Take 300 mg by mouth daily.   [DISCONTINUED] pantoprazole  (PROTONIX ) 40 MG tablet Take 1 tablet (40 mg total) by mouth daily.   [DISCONTINUED] traMADol  (ULTRAM ) 50 MG tablet Take 1 tablet (50 mg total) by mouth 3 (three) times daily.   [DISCONTINUED] traMADol  (ULTRAM ) 50 MG tablet Take 1 tablet (50 mg total) by mouth 3 (three) times daily.   [DISCONTINUED] triamcinolone  cream (KENALOG ) 0.1 % Apply topically to affected areas 2 (two) times daily.   [DISCONTINUED] triamcinolone  cream (KENALOG ) 0.5 % Apply to both hands 2 (two) times daily.   No facility-administered encounter medications on file as of 07/06/2023.    Surgical History: Past Surgical History:  Procedure Laterality Date   ABDOMINAL HYSTERECTOMY  1992   BREAST BIOPSY Right 2011   neg   BREAST BIOPSY Right 11/1996   Dense fibrosis,  benign epithelial hyperplasia.   CARPAL TUNNEL RELEASE Right 2012   COLONOSCOPY  2010   ENDOBRONCHIAL ULTRASOUND Right 01/25/2018   Procedure: ENDOBRONCHIAL ULTRASOUND;  Surgeon: Tamea Dedra CROME, MD;  Location: [facility] ORS;  Service: Cardiopulmonary;  Laterality: Right;   TONSILLECTOMY     TUBAL LIGATION     UPPER GI ENDOSCOPY      Medical History: Past Medical History:  Diagnosis Date   Anxiety    Carpal tunnel syndrome    right wrist   Dyspnea    GERD (gastroesophageal reflux disease)    Hyperlipidemia    Hypertension 20 yrs   Raynaud's phenomenon    Rheumatoid arteritis (HCC)    Ulcer     Family History: Family History  Problem Relation Age of Onset   Heart disease  Mother    Atrial fibrillation Mother    Heart disease Father    Heart attack Father    Breast cancer Sister 40   Breast cancer Maternal Aunt     Social History   Socioeconomic History   Marital status: Divorced    Spouse name: Not on file   Number of children: Not on file   Years of education: Not on file   Highest education level: Not on file  Occupational History   Not on file  Tobacco Use   Smoking status: Every Day    Current packs/day: 1.50    Average packs/day: 1.5 packs/day for 42.0 years (63.0 ttl pk-yrs)    Types: Cigarettes   Smokeless tobacco: Never   Tobacco comments:    currently 1.5 packs/day  Vaping Use   Vaping status: Never Used  Substance and Sexual Activity   Alcohol use: No   Drug use: No   Sexual activity: Never  Other Topics Concern   Not on file  Social History Narrative   Not on file   Social Drivers of Health   Financial Resource Strain: Not on file  Food Insecurity: Not on file  Transportation Needs: No Transportation Needs (06/22/2020)   Received from [facility]   PRAPARE - Transportation    In the past 12 months, has lack of transportation kept you from medical appointments or from getting medications?: No    Lack of Transportation (Non-Medical): No  Physical Activity: Not on file  Stress: Not on file  Social Connections: Not on file  Intimate Partner Violence: Not on file     Review of Systems  Vital Signs: BP 124/72   Pulse 89   Temp 97.6 F (36.4 C)   Resp 16   Ht 5' 3 (1.6 m)   Wt 110 lb 9.6 oz (50.2 kg)   SpO2 95%   BMI 19.59 kg/m    Physical Exam    Assessment/Plan: There are no diagnoses linked to this encounter.   General Counseling: rashi granier understanding of the findings of todays visit and agrees with plan of treatment. I have discussed any further diagnostic evaluation that may be needed or ordered today. We also reviewed her medications today. she has been encouraged to call  the office with any questions or concerns that should arise related to todays visit.    Orders Placed This Encounter  Procedures   CBC with Differential/Platelet   CMP14+EGFR   Lipid Profile   Hgb A1C w/o eAG   Vitamin D  (25 hydroxy)   B12 and Folate Panel    Meds ordered this encounter  Medications   pantoprazole  (PROTONIX ) 40 MG tablet    Sig: Take 1 tablet (40 mg total) by mouth daily.    Dispense:  90 tablet    Refill:  3   amLODipine  (NORVASC ) 2.5 MG tablet    Sig: Take 1  tablet (2.5 mg total) by mouth daily.    Dispense:  90 tablet    Refill:  3   traMADol  (ULTRAM ) 50 MG tablet    Sig: Take 1 tablet (50 mg total) by mouth 2 (two) times daily as needed for moderate pain (pain score 4-6) or severe pain (pain score 7-10).    Dispense:  60 tablet    Refill:  2    Continued prescription from previous PCP. Fill script today.    Return in about 3 months (around 09/29/2023) for CPE, Zakhai Meisinger PCP and review labs, and tramadol  refill.  Time spent:*** Minutes Time spent with patient included reviewing progress notes, labs, imaging studies, and discussing plan for follow up.   [place] Controlled Substance Database was reviewed by me for overdose risk score (ORS)   This patient was seen by Mardy Maxin, FNP-C in collaboration with Dr. Sigrid Bathe as a part of collaborative care agreement.   Trey Bebee R. Maxin, MSN, FNP-C Internal Medicine

## 2023-07-07 ENCOUNTER — Encounter: Payer: Self-pay | Admitting: Nurse Practitioner

## 2023-07-07 DIAGNOSIS — Z803 Family history of malignant neoplasm of breast: Secondary | ICD-10-CM | POA: Insufficient documentation

## 2023-07-07 DIAGNOSIS — M79604 Pain in right leg: Secondary | ICD-10-CM | POA: Insufficient documentation

## 2023-07-07 DIAGNOSIS — E782 Mixed hyperlipidemia: Secondary | ICD-10-CM | POA: Insufficient documentation

## 2023-07-07 DIAGNOSIS — K219 Gastro-esophageal reflux disease without esophagitis: Secondary | ICD-10-CM | POA: Insufficient documentation

## 2023-07-07 DIAGNOSIS — Z833 Family history of diabetes mellitus: Secondary | ICD-10-CM | POA: Insufficient documentation

## 2023-07-18 NOTE — Progress Notes (Unsigned)
 Cardiology Office Note  Date:  07/19/2023   ID:  Tracey, Walters 1960-04-15, MRN [id number]  PCP:  Tracey Fish, NP   Chief Complaint  Patient presents with   12 month follow up     Patient c/o shortness of breath with over exertion.     HPI:  [name] is a 63 year old woman with past medical history of Smoking/emphysema, 2 ppd Raynaud's Essential hypertension Hyperlipidemia Coronary calcification and aortic atherosclerosis Who presents for f/u of her coronary artery calcification  Last seen by myself in clinic April 24 Some SOB with overexertion in the garden  Still smoking 2 ppd, has not tried to cut down Denies any recent bronchitis episodes Reports blood pressure well-controlled Denies significant chest pain on exertion  CT scan January 30, 2022 with coronary artery calcification, aortic atherosclerosis, moderate emphysema Mild coronary calcification, mild to moderate diffuse aortic atherosclerosis noted  Labs reviewed Total chol 136, LDL 63 Potassium 3.5 New labs pending this fall  EKG personally reviewed by myself on todays visit EKG Interpretation Date/Time:  Monday July 19 2023 08:12:56 EDT Ventricular Rate:  75 PR Interval:  138 QRS Duration:  84 QT Interval:  406 QTC Calculation: 453 R Axis:   -18  Text Interpretation: Normal sinus rhythm Normal ECG When compared with ECG of 20-Jan-2018 09:24, No significant change was found Confirmed by Tracey Walters [id number]) on 07/19/2023 8:22:33 AM    Family hx Father with CAD, CABG,  Sister with stress cardiomyopathy  PMH:   has a past medical history of Anxiety, Carpal tunnel syndrome, Dyspnea, GERD (gastroesophageal reflux disease), Hyperlipidemia, Hypertension (20 yrs), Raynaud's phenomenon, Rheumatoid arteritis (HCC), and Ulcer.  PSH:    Past Surgical History:  Procedure Laterality Date   ABDOMINAL HYSTERECTOMY  1992   BREAST BIOPSY Right 2011   neg   BREAST BIOPSY Right 11/1996    Dense fibrosis,  benign epithelial hyperplasia.   CARPAL TUNNEL RELEASE Right 2012   COLONOSCOPY  2010   ENDOBRONCHIAL ULTRASOUND Right 01/25/2018   Procedure: ENDOBRONCHIAL ULTRASOUND;  Surgeon: Tracey Dedra CROME, MD;  Location: [facility] ORS;  Service: Cardiopulmonary;  Laterality: Right;   TONSILLECTOMY     TUBAL LIGATION     UPPER GI ENDOSCOPY      Current Outpatient Medications  Medication Sig Dispense Refill   albuterol  (VENTOLIN  HFA) 108 (90 Base) MCG/ACT inhaler Inhale 1-2 puffs into the lungs every 6 (six) hours as needed. 6.7 g 0   amLODipine  (NORVASC ) 2.5 MG tablet Take 1 tablet (2.5 mg total) by mouth daily. 90 tablet 3   meloxicam  (MOBIC ) 15 MG tablet Take 1 tablet (15 mg total) by mouth daily. 90 tablet 2   pantoprazole  (PROTONIX ) 40 MG tablet Take 1 tablet (40 mg total) by mouth daily. 90 tablet 3   rosuvastatin  (CRESTOR ) 20 MG tablet Take 1 tablet (20 mg total) by mouth daily. 90 tablet 3   traMADol  (ULTRAM ) 50 MG tablet Take 1 tablet (50 mg total) by mouth 2 (two) times daily as needed for moderate pain (pain score 4-6) or severe pain (pain score 7-10). 60 tablet 2   No current facility-administered medications for this visit.    Allergies:   Penicillins   Social History:  The patient  reports that she has been smoking cigarettes. She has a 63 pack-year smoking history. She has never used smokeless tobacco. She reports that she does not drink alcohol and does not use drugs.   Family History:   family history includes Atrial  fibrillation in her mother; Breast cancer in her maternal aunt; Breast cancer (age of onset: 67) in her sister; Heart attack in her father; Heart disease in her father and mother.    Review of Systems: Review of Systems  Constitutional: Negative.   HENT: Negative.    Respiratory: Negative.    Cardiovascular: Negative.   Gastrointestinal: Negative.   Musculoskeletal: Negative.   Neurological: Negative.   Psychiatric/Behavioral: Negative.    All  other systems reviewed and are negative.   PHYSICAL EXAM: VS:  BP 120/60 (BP Location: Left Arm, Patient Position: Sitting, Cuff Size: Normal)   Pulse 75   Ht 5' 3 (1.6 m)   Wt 110 lb 8 oz (50.1 kg)   BMI 19.57 kg/m  , BMI Body mass index is 19.57 kg/m. Constitutional:  oriented to person, place, and time. No distress.  HENT:  Head: Grossly normal Eyes:  no discharge. No scleral icterus.  Neck: No JVD, no carotid bruits  Cardiovascular: Regular rate and rhythm, no murmurs appreciated Pulmonary/Chest: Clear to auscultation bilaterally, no wheezes or rails Abdominal: Soft.  no distension.  no tenderness.  Musculoskeletal: Normal range of motion Neurological:  normal muscle tone. Coordination normal. No atrophy Skin: Skin warm and dry Psychiatric: normal affect, pleasant  Recent Labs: 09/08/2022: ALT 7; BUN 13; Creatinine, Ser 0.77; Hemoglobin 14.1; Platelets 335; Potassium 3.5; Sodium 141; TSH 4.095    Lipid Panel Lab Results  Component Value Date   CHOL 136 09/08/2022   HDL 55 09/08/2022   LDLCALC 63 09/08/2022   TRIG 88 09/08/2022      Wt Readings from Last 3 Encounters:  07/19/23 110 lb 8 oz (50.1 kg)  07/06/23 110 lb 9.6 oz (50.2 kg)  05/05/22 113 lb 12.8 oz (51.6 kg)       ASSESSMENT AND PLAN:  Problem List Items Addressed This Visit       Cardiology Problems   Mixed hyperlipidemia   Essential hypertension   Relevant Orders   EKG 12-Lead (Completed)   Raynaud's disease without gangrene     Other   Gastroesophageal reflux disease without esophagitis   Smoker   Other Visit Diagnoses       Coronary artery disease of native artery of native heart with stable angina pectoris (HCC)    -  Primary   Relevant Orders   EKG 12-Lead (Completed)     Murmur       Relevant Orders   EKG 12-Lead (Completed)     Centrilobular emphysema (HCC)           Coronary disease Calcification and aortic atherosclerosis noted on CT scan 2024 Chest pain concerning for  angina Cholesterol at goal, recommended smoking cessation For chest pain symptoms on exertion could consider stress testing or cardiac CTA  Mild to moderate mitral valve regurgitation On echocardiogram May 2024  Smoker We have encouraged her to continue to work on weaning her cigarettes and smoking cessation. She will continue to work on this and does not want any assistance with chantix .    Hyperlipidemia Continue Crestor  at current dose  COPD/emphysema Monitored by primary care, annual CT screening No new nodules 2024 Smoking cessation recommended  Aortic atherosclerosis Mild to moderate diffuse disease Cholesterol at goal, smoking cessation recommended   Signed, Tim Chan Rosasco, M.D., Ph.D. [facility] [place], [place] [phone]

## 2023-07-19 ENCOUNTER — Encounter: Payer: Self-pay | Admitting: Cardiovascular Disease

## 2023-07-19 ENCOUNTER — Ambulatory Visit: Attending: Cardiovascular Disease | Admitting: Cardiovascular Disease

## 2023-07-19 VITALS — BP 120/60 | HR 75 | Ht 63.0 in | Wt 110.5 lb

## 2023-07-19 DIAGNOSIS — K219 Gastro-esophageal reflux disease without esophagitis: Secondary | ICD-10-CM | POA: Diagnosis not present

## 2023-07-19 DIAGNOSIS — F17200 Nicotine dependence, unspecified, uncomplicated: Secondary | ICD-10-CM | POA: Diagnosis not present

## 2023-07-19 DIAGNOSIS — I7300 Raynaud's syndrome without gangrene: Secondary | ICD-10-CM | POA: Diagnosis not present

## 2023-07-19 DIAGNOSIS — I25118 Atherosclerotic heart disease of native coronary artery with other forms of angina pectoris: Secondary | ICD-10-CM

## 2023-07-19 DIAGNOSIS — I10 Essential (primary) hypertension: Secondary | ICD-10-CM

## 2023-07-19 DIAGNOSIS — E782 Mixed hyperlipidemia: Secondary | ICD-10-CM

## 2023-07-19 DIAGNOSIS — R011 Cardiac murmur, unspecified: Secondary | ICD-10-CM

## 2023-07-19 DIAGNOSIS — J432 Centrilobular emphysema: Secondary | ICD-10-CM

## 2023-07-19 NOTE — Patient Instructions (Signed)

## 2023-07-20 ENCOUNTER — Encounter: Payer: Self-pay | Admitting: Podiatry

## 2023-07-20 ENCOUNTER — Ambulatory Visit (INDEPENDENT_AMBULATORY_CARE_PROVIDER_SITE_OTHER)

## 2023-07-20 ENCOUNTER — Ambulatory Visit: Admitting: Podiatry

## 2023-07-20 VITALS — Ht 63.0 in | Wt 110.5 lb

## 2023-07-20 DIAGNOSIS — M2042 Other hammer toe(s) (acquired), left foot: Secondary | ICD-10-CM | POA: Diagnosis not present

## 2023-07-20 DIAGNOSIS — M7752 Other enthesopathy of left foot: Secondary | ICD-10-CM

## 2023-07-20 NOTE — Progress Notes (Signed)
 Subjective:  Patient ID: Tracey Walters, female    DOB: 1960-11-16,  MRN: [id number]  Chief Complaint  Patient presents with   Toe Pain    Pt is here due to left 5th toe pain states the pain has been going on for a while starts at the top of the toe to the bottom.    63 y.o. female presents with the above complaint.  Patient presents with left fifth digit porokeratotic lesion painful to touch is progressing and worsens with ambulation and shoe pressure she would like to discuss treatment options for pain scale 7 out of 10 dull aching nature came out of nowhere.  Denies any other acute issues.   Review of Systems: Negative except as noted in the HPI. Denies N/V/F/Ch.  Past Medical History:  Diagnosis Date   Anxiety    Carpal tunnel syndrome    right wrist   Dyspnea    GERD (gastroesophageal reflux disease)    Hyperlipidemia    Hypertension 20 yrs   Raynaud's phenomenon    Rheumatoid arteritis (HCC)    Ulcer     Current Outpatient Medications:    albuterol  (VENTOLIN  HFA) 108 (90 Base) MCG/ACT inhaler, Inhale 1-2 puffs into the lungs every 6 (six) hours as needed., Disp: 6.7 g, Rfl: 0   amLODipine  (NORVASC ) 2.5 MG tablet, Take 1 tablet (2.5 mg total) by mouth daily., Disp: 90 tablet, Rfl: 3   meloxicam  (MOBIC ) 15 MG tablet, Take 1 tablet (15 mg total) by mouth daily., Disp: 90 tablet, Rfl: 2   pantoprazole  (PROTONIX ) 40 MG tablet, Take 1 tablet (40 mg total) by mouth daily., Disp: 90 tablet, Rfl: 3   rosuvastatin  (CRESTOR ) 20 MG tablet, Take 1 tablet (20 mg total) by mouth daily., Disp: 90 tablet, Rfl: 3   traMADol  (ULTRAM ) 50 MG tablet, Take 1 tablet (50 mg total) by mouth 2 (two) times daily as needed for moderate pain (pain score 4-6) or severe pain (pain score 7-10)., Disp: 60 tablet, Rfl: 2  Social History   Tobacco Use  Smoking Status Every Day   Current packs/day: 1.50   Average packs/day: 1.5 packs/day for 42.0 years (63.0 ttl pk-yrs)   Types: Cigarettes  Smokeless  Tobacco Never  Tobacco Comments   currently 1.5 packs/day    Allergies  Allergen Reactions   Penicillins Hives and Other (See Comments)    DID THE REACTION INVOLVE: Swelling of the face/tongue/throat, SOB, or low BP? No  Sudden or severe rash/hives, skin peeling, or the inside of the mouth or nose? No  Did it require medical treatment? No  When did it last happen?      unkn  If all above answers are "NO", may proceed with cephalosporin use.   Objective:  There were no vitals filed for this visit. Body mass index is 19.57 kg/m. Constitutional Well developed. Well nourished.  Vascular Dorsalis pedis pulses palpable bilaterally. Posterior tibial pulses palpable bilaterally. Capillary refill normal to all digits.  No cyanosis or clubbing noted. Pedal hair growth normal.  Neurologic Normal speech. Oriented to person, place, and time. Epicritic sensation to light touch grossly present bilaterally.  Dermatologic Nails well groomed and normal in appearance. No open wounds. No skin lesions.  Orthopedic: Pain on palpation left fifth digit pain with ambulation pain on palpation adductovarus rotation hammertoe contracture of fifth digit noted.   Radiographs: 3 views of skeletally mature left foot: Adductovarus and fifth digit hammertoe contracture noted.  No arthritis noted no other bony abnormalities identified  Assessment:   1. Hammertoe of left foot    Plan:  Patient was evaluated and treated and all questions answered.  Left fifth digit hammertoe contracture/porokeratotic lesion - Questions or concerns were discussed with the patient excessive detail given the amount of pain that she is having she would benefit from debridement of the lesion.  Using chisel blade handle as a courtesy lesion was debrided down to healthy dry tissue no complication or no pinpoint bleeding noted - Sugar modification discussed - Toe protectors were dispensed  No follow-ups on file.

## 2023-08-02 ENCOUNTER — Other Ambulatory Visit: Payer: Self-pay

## 2023-08-05 ENCOUNTER — Other Ambulatory Visit: Payer: Self-pay | Admitting: Acute Care

## 2023-08-05 DIAGNOSIS — Z122 Encounter for screening for malignant neoplasm of respiratory organs: Secondary | ICD-10-CM

## 2023-08-05 DIAGNOSIS — Z87891 Personal history of nicotine dependence: Secondary | ICD-10-CM

## 2023-08-05 DIAGNOSIS — F17210 Nicotine dependence, cigarettes, uncomplicated: Secondary | ICD-10-CM

## 2023-08-16 ENCOUNTER — Ambulatory Visit
Admission: RE | Admit: 2023-08-16 | Discharge: 2023-08-16 | Disposition: A | Discharge status: Home / Self Care | Source: Ambulatory Visit | Attending: Acute Care | Admitting: Acute Care

## 2023-08-16 DIAGNOSIS — Z87891 Personal history of nicotine dependence: Secondary | ICD-10-CM | POA: Insufficient documentation

## 2023-08-16 DIAGNOSIS — F17210 Nicotine dependence, cigarettes, uncomplicated: Secondary | ICD-10-CM | POA: Diagnosis not present

## 2023-08-16 DIAGNOSIS — Z122 Encounter for screening for malignant neoplasm of respiratory organs: Secondary | ICD-10-CM | POA: Diagnosis not present

## 2023-08-25 ENCOUNTER — Other Ambulatory Visit: Payer: Self-pay

## 2023-08-30 ENCOUNTER — Telehealth: Payer: Self-pay | Admitting: Acute Care

## 2023-08-30 DIAGNOSIS — R918 Other nonspecific abnormal finding of lung field: Secondary | ICD-10-CM

## 2023-08-30 NOTE — Telephone Encounter (Signed)
 Call report LDCT:  IMPRESSION: 1. Slowly growing spiculated right upper lobe nodule, worrisome for indolent adenocarcinoma. Lung-RADS 4B, suspicious. Additional imaging evaluation or consultation with Pulmonology or Thoracic Surgery recommended. These results will be called to the ordering clinician or representative by the Radiologist Assistant, and communication documented in the PACS or [employer]. 2. Aortic atherosclerosis (ICD10-I70.0). Coronary artery calcification. 3.  Emphysema (ICD10-J43.9).

## 2023-08-30 NOTE — Telephone Encounter (Signed)
 I have attempted to call the patient with the results of their  Low Dose CT Chest Lung cancer screening scan. There was no answer. I have left a HIPPA compliant VM requesting the patient call the office for the scan results. I included the office contact information in the message. We will await his return call. If no return call we will continue to call until patient is contacted.    Pt. Has a slowly growing spiculated right upper lobe pulmonary nodule worrisome for an indolent adenocarcinoma. She needs a PET scan , then follow up with Dr. Tamea in [place]. ( She has seen her in the past) . Please place the PET order for [place] as soon as you have spoken with her. If you would prefer speak with her, let me know when she returns the call.  Dr. KANDICE, just FYI. She will need follow up with you after the PET scan.

## 2023-08-31 NOTE — Telephone Encounter (Signed)
 Patient called in. Informed her of the results of her LDCT and the concerning growth of a spiculated nodule. Patient aware the nodule is suspicious of malignancy and the need for a PET scan. Patient is agreeable to this, order placed. Will need to schedule OV once PET has been scheduled. Patient denied any questions at this time but has our direct line in case she needs to call back.

## 2023-08-31 NOTE — Telephone Encounter (Signed)
 Spoke with patient. Attempted to review results. She was at work at time of call. Pt requested that she call us  back at noon today. Contact information provided.

## 2023-09-02 ENCOUNTER — Other Ambulatory Visit
Admission: RE | Admit: 2023-09-02 | Discharge: 2023-09-02 | Disposition: A | Discharge status: Home / Self Care | Source: Ambulatory Visit | Attending: Nurse Practitioner | Admitting: Nurse Practitioner

## 2023-09-02 DIAGNOSIS — E559 Vitamin D deficiency, unspecified: Secondary | ICD-10-CM | POA: Diagnosis not present

## 2023-09-02 DIAGNOSIS — Z833 Family history of diabetes mellitus: Secondary | ICD-10-CM | POA: Insufficient documentation

## 2023-09-02 DIAGNOSIS — E782 Mixed hyperlipidemia: Secondary | ICD-10-CM | POA: Diagnosis not present

## 2023-09-02 DIAGNOSIS — Z803 Family history of malignant neoplasm of breast: Secondary | ICD-10-CM | POA: Diagnosis not present

## 2023-09-02 DIAGNOSIS — I10 Essential (primary) hypertension: Secondary | ICD-10-CM | POA: Diagnosis not present

## 2023-09-02 DIAGNOSIS — E538 Deficiency of other specified B group vitamins: Secondary | ICD-10-CM | POA: Insufficient documentation

## 2023-09-02 LAB — CBC WITH DIFFERENTIAL/PLATELET
Abs Immature Granulocytes: 0.03 K/uL (ref 0.00–0.07)
Basophils Absolute: 0.1 K/uL (ref 0.0–0.1)
Basophils Relative: 1 %
Eosinophils Absolute: 0.2 K/uL (ref 0.0–0.5)
Eosinophils Relative: 3 %
HCT: 46.2 % — ABNORMAL HIGH (ref 36.0–46.0)
Hemoglobin: 15.2 g/dL — ABNORMAL HIGH (ref 12.0–15.0)
Immature Granulocytes: 0 %
Lymphocytes Relative: 23 %
Lymphs Abs: 1.9 K/uL (ref 0.7–4.0)
MCH: 32.6 pg (ref 26.0–34.0)
MCHC: 32.9 g/dL (ref 30.0–36.0)
MCV: 99.1 fL (ref 80.0–100.0)
Monocytes Absolute: 0.5 K/uL (ref 0.1–1.0)
Monocytes Relative: 6 %
Neutro Abs: 5.4 K/uL (ref 1.7–7.7)
Neutrophils Relative %: 67 %
Platelets: 329 K/uL (ref 150–400)
RBC: 4.66 MIL/uL (ref 3.87–5.11)
RDW: 12.5 % (ref 11.5–15.5)
WBC: 8 K/uL (ref 4.0–10.5)
nRBC: 0 % (ref 0.0–0.2)

## 2023-09-02 LAB — VITAMIN B12: Vitamin B-12: 152 pg/mL — ABNORMAL LOW (ref 180–914)

## 2023-09-02 LAB — COMPREHENSIVE METABOLIC PANEL WITH GFR
ALT: 6 U/L (ref 0–44)
AST: 21 U/L (ref 15–41)
Albumin: 3.8 g/dL (ref 3.5–5.0)
Alkaline Phosphatase: 63 U/L (ref 38–126)
Anion gap: 12 (ref 5–15)
BUN: 11 mg/dL (ref 8–23)
CO2: 27 mmol/L (ref 22–32)
Calcium: 9 mg/dL (ref 8.9–10.3)
Chloride: 104 mmol/L (ref 98–111)
Creatinine, Ser: 0.77 mg/dL (ref 0.44–1.00)
GFR, Estimated: >60 mL/min (ref >60)
Glucose, Bld: 107 mg/dL — ABNORMAL HIGH (ref 70–99)
Potassium: 3.5 mmol/L (ref 3.5–5.1)
Sodium: 143 mmol/L (ref 135–145)
Total Bilirubin: 0.3 mg/dL (ref 0.0–1.2)
Total Protein: 7.5 g/dL (ref 6.5–8.1)

## 2023-09-02 LAB — LIPID PANEL
Cholesterol: 153 mg/dL (ref 0–200)
HDL: 58 mg/dL (ref >40)
LDL Cholesterol: 77 mg/dL (ref 0–99)
Total CHOL/HDL Ratio: 2.6 ratio
Triglycerides: 91 mg/dL (ref <150)
VLDL: 18 mg/dL (ref 0–40)

## 2023-09-02 LAB — HEMOGLOBIN A1C
Hgb A1c MFr Bld: 6.6 % — ABNORMAL HIGH (ref 4.8–5.6)
Mean Plasma Glucose: 142.72 mg/dL

## 2023-09-02 LAB — FOLATE: Folate: 11.7 ng/mL (ref >5.9)

## 2023-09-02 LAB — VITAMIN D 25 HYDROXY (VIT D DEFICIENCY, FRACTURES): Vit D, 25-Hydroxy: 8.38 ng/mL — ABNORMAL LOW (ref 30–100)

## 2023-09-06 ENCOUNTER — Ambulatory Visit
Admission: RE | Admit: 2023-09-06 | Discharge: 2023-09-06 | Disposition: A | Discharge status: Home / Self Care | Source: Ambulatory Visit | Attending: Pulmonary Disease | Admitting: Pulmonary Disease

## 2023-09-06 DIAGNOSIS — Z122 Encounter for screening for malignant neoplasm of respiratory organs: Secondary | ICD-10-CM | POA: Diagnosis not present

## 2023-09-06 DIAGNOSIS — F17200 Nicotine dependence, unspecified, uncomplicated: Secondary | ICD-10-CM | POA: Insufficient documentation

## 2023-09-06 DIAGNOSIS — R918 Other nonspecific abnormal finding of lung field: Secondary | ICD-10-CM | POA: Insufficient documentation

## 2023-09-06 DIAGNOSIS — N200 Calculus of kidney: Secondary | ICD-10-CM | POA: Diagnosis not present

## 2023-09-06 DIAGNOSIS — R911 Solitary pulmonary nodule: Secondary | ICD-10-CM | POA: Diagnosis not present

## 2023-09-06 DIAGNOSIS — E119 Type 2 diabetes mellitus without complications: Secondary | ICD-10-CM | POA: Insufficient documentation

## 2023-09-06 LAB — GLUCOSE, CAPILLARY: Glucose-Capillary: 91 mg/dL (ref 70–99)

## 2023-09-06 MED ORDER — FLUDEOXYGLUCOSE F - 18 (FDG) INJECTION
6.1000 | Freq: Once | INTRAVENOUS | Status: AC | PRN
  Administered 2023-09-06: 6.1 via INTRAVENOUS

## 2023-09-08 ENCOUNTER — Encounter: Payer: Self-pay | Admitting: Nurse Practitioner

## 2023-09-08 ENCOUNTER — Other Ambulatory Visit: Payer: Self-pay

## 2023-09-08 ENCOUNTER — Ambulatory Visit (INDEPENDENT_AMBULATORY_CARE_PROVIDER_SITE_OTHER): Admitting: Nurse Practitioner

## 2023-09-08 VITALS — BP 137/78 | HR 83 | Temp 98.4°F | Resp 16 | Ht 63.0 in | Wt 104.0 lb

## 2023-09-08 DIAGNOSIS — E559 Vitamin D deficiency, unspecified: Secondary | ICD-10-CM | POA: Diagnosis not present

## 2023-09-08 DIAGNOSIS — E538 Deficiency of other specified B group vitamins: Secondary | ICD-10-CM | POA: Insufficient documentation

## 2023-09-08 DIAGNOSIS — R0602 Shortness of breath: Secondary | ICD-10-CM

## 2023-09-08 DIAGNOSIS — Z0001 Encounter for general adult medical examination with abnormal findings: Secondary | ICD-10-CM | POA: Diagnosis not present

## 2023-09-08 DIAGNOSIS — E1165 Type 2 diabetes mellitus with hyperglycemia: Secondary | ICD-10-CM | POA: Diagnosis not present

## 2023-09-08 MED ORDER — ALBUTEROL SULFATE HFA 108 (90 BASE) MCG/ACT IN AERS
1.0000 | INHALATION_SPRAY | Freq: Four times a day (QID) | RESPIRATORY_TRACT | 5 refills | Status: AC | PRN
  Filled 2023-09-08: qty 6.7, 25d supply, fill #0

## 2023-09-08 MED ORDER — SYRINGE 25G X 5/8" 3 ML MISC
3 refills | Status: AC
Start: 2023-09-08 — End: ?
  Filled 2023-09-08: qty 6, 84d supply, fill #0
  Filled 2023-12-05: qty 6, 84d supply, fill #1

## 2023-09-08 MED ORDER — VITAMIN D (ERGOCALCIFEROL) 1.25 MG (50000 UNIT) PO CAPS
50000.0000 [IU] | ORAL_CAPSULE | ORAL | 1 refills | Status: AC
  Filled 2023-09-08: qty 12, 84d supply, fill #0
  Filled 2023-12-05: qty 12, 84d supply, fill #1

## 2023-09-08 MED ORDER — CYANOCOBALAMIN 1000 MCG/ML IJ SOLN
1000.0000 ug | Freq: Once | INTRAMUSCULAR | Status: AC
  Administered 2023-09-08: 1000 ug via INTRAMUSCULAR

## 2023-09-08 MED ORDER — CYANOCOBALAMIN 1000 MCG/ML IJ SOLN
INTRAMUSCULAR | 3 refills | Status: AC
  Filled 2023-09-08: qty 3, 44d supply, fill #0
  Filled 2023-10-25: qty 3, 44d supply, fill #1
  Filled 2024-01-11: qty 3, 90d supply, fill #2

## 2023-09-08 NOTE — Progress Notes (Signed)
 [facility] [address] [place], [place] [address]  Internal MEDICINE  Office Visit Note  Patient Name: Tracey Walters  [id number]  [id number]  Date of Service: 09/08/2023  Chief Complaint  Patient presents with   Annual Exam    PET scan and lab review   Gastroesophageal Reflux   Hypertension   Hyperlipidemia   Medication Refill    Albuterol     HPI Tracey Walters presents for an annual well visit and physical exam.  Well-appearing 63 y.o. female with new onset diabetes, hypertension and high cholesterol and GERD  Routine CRC screening: due in 2034, last done in December 2024 Routine mammogram: done in January this year  DEXA scan: due next year  Pap smear: discontinued, hysterectomy.  Eye exam and/or foot exam: Labs: lab results reviewed with patient. A1c is elevated at 6.6 -- new onset type 2 diabetes.  Severely low B12 152 Very low vitamin D  8.3 Cholesterol panel is normal New or worsening pain: none  Other concerns: none  Leaving for a short trip to the beach today.     Current Medication: Outpatient Encounter Medications as of 09/08/2023  Medication Sig   amLODipine  (NORVASC ) 2.5 MG tablet Take 1 tablet (2.5 mg total) by mouth daily.   cyanocobalamin  (VITAMIN B12) 1000 MCG/ML injection Inject 1 ml intramuscularly every 7 days x2 doses then decrease to every 30 days x5 doses.   meloxicam  (MOBIC ) 15 MG tablet Take 1 tablet (15 mg total) by mouth daily.   pantoprazole  (PROTONIX ) 40 MG tablet Take 1 tablet (40 mg total) by mouth daily.   rosuvastatin  (CRESTOR ) 20 MG tablet Take 1 tablet (20 mg total) by mouth daily.   Syringe/Needle, Disp, (SYRINGE 3CC/25GX5/8) 25G X 5/8 3 ML MISC Use 1 needle and syringe to draw up medication out of the vial, changing needle prior to injecting medication into skin, use with injectable B12.   traMADol  (ULTRAM ) 50 MG tablet Take 1 tablet (50 mg total) by mouth 2 (two) times daily as needed for moderate pain (pain score 4-6) or  severe pain (pain score 7-10).   Vitamin D , Ergocalciferol , (DRISDOL ) 1.25 MG (50000 UNIT) CAPS capsule Take 1 capsule (50,000 Units total) by mouth every 7 (seven) days.   [DISCONTINUED] albuterol  (VENTOLIN  HFA) 108 (90 Base) MCG/ACT inhaler Inhale 1-2 puffs into the lungs every 6 (six) hours as needed.   albuterol  (VENTOLIN  HFA) 108 (90 Base) MCG/ACT inhaler Inhale 1-2 puffs into the lungs every 6 (six) hours as needed.   Facility-Administered Encounter Medications as of 09/08/2023  Medication   cyanocobalamin  (VITAMIN B12) injection 1,000 mcg    Surgical History: Past Surgical History:  Procedure Laterality Date   ABDOMINAL HYSTERECTOMY  1992   BREAST BIOPSY Right 2011   neg   BREAST BIOPSY Right 11/1996   Dense fibrosis,  benign epithelial hyperplasia.   CARPAL TUNNEL RELEASE Right 2012   COLONOSCOPY  2010   ENDOBRONCHIAL ULTRASOUND Right 01/25/2018   Procedure: ENDOBRONCHIAL ULTRASOUND;  Surgeon: Tamea Dedra CROME, MD;  Location: [facility] ORS;  Service: Cardiopulmonary;  Laterality: Right;   TONSILLECTOMY     TUBAL LIGATION     UPPER GI ENDOSCOPY      Medical History: Past Medical History:  Diagnosis Date   Anxiety    Carpal tunnel syndrome    right wrist   Dyspnea    GERD (gastroesophageal reflux disease)    Hyperlipidemia    Hypertension 20 yrs   Raynaud's phenomenon    Rheumatoid arteritis (HCC)  Ulcer     Family History: Family History  Problem Relation Age of Onset   Heart disease Mother    Atrial fibrillation Mother    Heart disease Father    Heart attack Father    Breast cancer Sister 100   Breast cancer Maternal Aunt     Social History   Socioeconomic History   Marital status: Divorced    Spouse name: Not on file   Number of children: Not on file   Years of education: Not on file   Highest education level: Not on file  Occupational History   Not on file  Tobacco Use   Smoking status: Every Day    Current packs/day: 1.50    Average packs/day:  1.5 packs/day for 42.0 years (63.0 ttl pk-yrs)    Types: Cigarettes   Smokeless tobacco: Never   Tobacco comments:    2 PPD  Vaping Use   Vaping status: Never Used  Substance and Sexual Activity   Alcohol use: No   Drug use: No   Sexual activity: Never  Other Topics Concern   Not on file  Social History Narrative   Not on file   Social Drivers of Health   Financial Resource Strain: Not on file  Food Insecurity: Not on file  Transportation Needs: No Transportation Needs (06/22/2020)   Received from [facility]   PRAPARE - Transportation    In the past 12 months, has lack of transportation kept you from medical appointments or from getting medications?: No    Lack of Transportation (Non-Medical): No  Physical Activity: Not on file  Stress: Not on file  Social Connections: Not on file  Intimate Partner Violence: Not on file      Review of Systems  Constitutional:  Negative for activity change, appetite change, chills, fatigue, fever and unexpected weight change.  HENT: Negative.  Negative for congestion, ear pain, rhinorrhea, sore throat and trouble swallowing.   Eyes: Negative.   Respiratory: Negative.  Negative for cough, chest tightness, shortness of breath and wheezing.   Cardiovascular: Negative.  Negative for chest pain.  Gastrointestinal: Negative.  Negative for abdominal pain, blood in stool, constipation, diarrhea, nausea and vomiting.  Endocrine: Negative.   Genitourinary: Negative.  Negative for difficulty urinating, dysuria, frequency, hematuria and urgency.  Musculoskeletal: Negative.  Negative for arthralgias, back pain, joint swelling, myalgias and neck pain.  Skin: Negative.  Negative for rash and wound.  Allergic/Immunologic: Negative.  Negative for immunocompromised state.  Neurological: Negative.  Negative for dizziness, seizures, numbness and headaches.  Hematological: Negative.   Psychiatric/Behavioral: Negative.  Negative for  behavioral problems, self-injury and suicidal ideas. The patient is not nervous/anxious.     Vital Signs: BP 137/78   Pulse 83   Temp 98.4 F (36.9 C)   Resp 16   Ht 5' 3 (1.6 m)   Wt 104 lb (47.2 kg)   SpO2 95%   BMI 18.42 kg/m    Physical Exam Vitals reviewed.  Constitutional:      General: She is not in acute distress.    Appearance: Normal appearance. She is well-developed. She is not ill-appearing or diaphoretic.  HENT:     Head: Normocephalic and atraumatic.     Right Ear: Tympanic membrane, ear canal and external ear normal.     Left Ear: Tympanic membrane, ear canal and external ear normal.     Nose: Nose normal. No congestion or rhinorrhea.     Mouth/Throat:  Mouth: Mucous membranes are moist.     Pharynx: Oropharynx is clear. No oropharyngeal exudate or posterior oropharyngeal erythema.  Eyes:     General: No scleral icterus.       Right eye: No discharge.        Left eye: No discharge.     Extraocular Movements: Extraocular movements intact.     Conjunctiva/sclera: Conjunctivae normal.     Pupils: Pupils are equal, round, and reactive to light.  Neck:     Thyroid : No thyromegaly.     Vascular: No JVD.     Trachea: No tracheal deviation.  Cardiovascular:     Rate and Rhythm: Normal rate and regular rhythm.     Pulses: Normal pulses.     Heart sounds: Normal heart sounds. No murmur heard.    No friction rub. No gallop.  Pulmonary:     Effort: Pulmonary effort is normal. No respiratory distress.     Breath sounds: Normal breath sounds. No stridor. No wheezing or rales.  Chest:     Chest wall: No tenderness.  Abdominal:     General: Bowel sounds are normal. There is no distension.     Palpations: Abdomen is soft. There is no mass.     Tenderness: There is no abdominal tenderness. There is no guarding or rebound.  Musculoskeletal:        General: No tenderness or deformity. Normal range of motion.     Cervical back: Normal range of motion and neck  supple.  Lymphadenopathy:     Cervical: No cervical adenopathy.  Skin:    General: Skin is warm and dry.     Capillary Refill: Capillary refill takes less than 2 seconds.     Coloration: Skin is not pale.     Findings: No erythema or rash.  Neurological:     Mental Status: She is alert and oriented to person, place, and time.     Cranial Nerves: No cranial nerve deficit.     Motor: No abnormal muscle tone.     Coordination: Coordination normal.     Deep Tendon Reflexes: Reflexes are normal and symmetric.  Psychiatric:        Mood and Affect: Mood normal.        Behavior: Behavior normal.        Thought Content: Thought content normal.        Judgment: Judgment normal.        Assessment/Plan: 1. Encounter for routine adult health examination with abnormal findings (Primary) Age-appropriate preventive screenings and vaccinations discussed, annual physical exam completed. Routine labs for health maintenance ordered, see below. PHM updated.    2. Type 2 diabetes mellitus with hyperglycemia, without long-term current use of insulin (HCC) New onset, wants to work on diet first. Will repeat A1c in 4 months  3. SOB (shortness of breath) Continue prn albuterol  inhaler  - albuterol  (VENTOLIN  HFA) 108 (90 Base) MCG/ACT inhaler; Inhale 1-2 puffs into the lungs every 6 (six) hours as needed.  Dispense: 6.7 g; Refill: 5  4. B12 deficiency B12 injection administered today and then continue B12 injections at home as prescribed.  - cyanocobalamin  (VITAMIN B12) injection 1,000 mcg - cyanocobalamin  (VITAMIN B12) 1000 MCG/ML injection; Inject 1 ml intramuscularly every 7 days x2 doses then decrease to every 30 days x5 doses.  Dispense: 3 mL; Refill: 3 - Syringe/Needle, Disp, (SYRINGE 3CC/25GX5/8) 25G X 5/8 3 ML MISC; Use 1 needle and syringe to draw up medication out of the vial,  changing needle prior to injecting medication into skin, use with injectable B12.  Dispense: 6 each; Refill: 3  5.  Vitamin D  deficiency Start vitamin D  weekly supplement as prescribed, will repeat labs in 6 months  - Vitamin D , Ergocalciferol , (DRISDOL ) 1.25 MG (50000 UNIT) CAPS capsule; Take 1 capsule (50,000 Units total) by mouth every 7 (seven) days.  Dispense: 12 capsule; Refill: 1      General Counseling: rashawna scoles understanding of the findings of todays visit and agrees with plan of treatment. I have discussed any further diagnostic evaluation that may be needed or ordered today. We also reviewed her medications today. she has been encouraged to call the office with any questions or concerns that should arise related to todays visit.    No orders of the defined types were placed in this encounter.   Meds ordered this encounter  Medications   Vitamin D , Ergocalciferol , (DRISDOL ) 1.25 MG (50000 UNIT) CAPS capsule    Sig: Take 1 capsule (50,000 Units total) by mouth every 7 (seven) days.    Dispense:  12 capsule    Refill:  1   cyanocobalamin  (VITAMIN B12) injection 1,000 mcg   cyanocobalamin  (VITAMIN B12) 1000 MCG/ML injection    Sig: Inject 1 ml intramuscularly every 7 days x2 doses then decrease to every 30 days x5 doses.    Dispense:  3 mL    Refill:  3    Fill new script today   Syringe/Needle, Disp, (SYRINGE 3CC/25GX5/8) 25G X 5/8 3 ML MISC    Sig: Use 1 needle and syringe to draw up medication out of the vial, changing needle prior to injecting medication into skin, use with injectable B12.    Dispense:  6 each    Refill:  3    Fill new script today.   albuterol  (VENTOLIN  HFA) 108 (90 Base) MCG/ACT inhaler    Sig: Inhale 1-2 puffs into the lungs every 6 (six) hours as needed.    Dispense:  6.7 g    Refill:  5    Fill new script today    Return in about 6 months (around 03/10/2024) for F/U, Matias Thurman PCP.   Total time spent:30 Minutes Time spent includes review of chart, medications, test results, and follow up plan with the patient.   [place] Controlled Substance Database  was reviewed by me.  This patient was seen by Mardy Maxin, FNP-C in collaboration with Dr. Sigrid Bathe as a part of collaborative care agreement.  Felix Meras R. Maxin, MSN, FNP-C Internal medicine

## 2023-09-10 ENCOUNTER — Encounter: Payer: Self-pay | Admitting: Nurse Practitioner

## 2023-09-14 ENCOUNTER — Ambulatory Visit: Payer: Self-pay | Admitting: Pulmonary Disease

## 2023-09-14 ENCOUNTER — Ambulatory Visit: Admitting: Pulmonary Disease

## 2023-09-14 ENCOUNTER — Encounter: Payer: Self-pay | Admitting: Pulmonary Disease

## 2023-09-14 ENCOUNTER — Telehealth: Payer: Self-pay

## 2023-09-14 VITALS — BP 130/64 | HR 92 | Temp 97.6°F | Ht 63.0 in | Wt 108.6 lb

## 2023-09-14 DIAGNOSIS — R590 Localized enlarged lymph nodes: Secondary | ICD-10-CM | POA: Diagnosis not present

## 2023-09-14 DIAGNOSIS — F17210 Nicotine dependence, cigarettes, uncomplicated: Secondary | ICD-10-CM | POA: Diagnosis not present

## 2023-09-14 DIAGNOSIS — R911 Solitary pulmonary nodule: Secondary | ICD-10-CM

## 2023-09-14 NOTE — Telephone Encounter (Signed)
 Bronchoscopy Robotic  09/20/2023 12:30 pm Dx: R91.1 CPT Code: 68372, 31652, 68346  [place] please see Bronch info.

## 2023-09-14 NOTE — Progress Notes (Signed)
 Subjective:    Patient ID: Tracey Walters, female    DOB: 06/23/1960, 63 y.o.   MRN: [id number]  Patient Care Team: Liana Fish, NP as PCP - General (Nurse Practitioner) Dessa Reyes ORN, MD (General Surgery)  Chief Complaint  Patient presents with   Consult    BACKGROUND: Patient is a 63 year old current smoker with a history as noted below, who presents for evaluation of a lung nodule and mediastinal adenopathy.  Previously evaluated in 2019 with bronchoscopy with endobronchial ultrasound on 25 January 2018 with sampling of station 10R which was negative for malignancy.  The patient continued to follow with lung cancer screening CTs and was lost to follow-up pulmonary.  She presents today for evaluation of an enlarging right lower lobe nodule and mediastinal adenopathy (right hilar).   HPI Discussed the use of AI scribe software for clinical note transcription with the patient, who gave verbal consent to proceed.  History of Present Illness   Tracey Walters is a 63 year old female who presents for evaluation of mediastinal adenopathy and right lower lobe nodule.  She is kindly referred by Fish Liana, NP.  The lung nodule, previously stable, has changed over two years. Recent CT and PET scans have shown new activity in the nodule and hilar region, prompting further investigation. A prior biopsy of the hilar region (station 10R) was negative, but the new activity necessitates another biopsy.  On her imaging in 2019 the nodule was only 9 mm in size and not amenable to biopsy by the available technology at the time.  PET/CT at the time showed weak uptake on the nodule but higher uptake on the right hilar region.  After EBUS the patient was advised to continue following closely however she initially declined further workup and then was eventually lost to follow-up.  She experiences seasonal allergies, particularly during the fall due to ragweed, which exacerbates her  respiratory symptoms. This time of year is particularly challenging for her respiratory health, as she has had pneumonia in the past during this season.  She continues to smoke, she smokes anywhere between 1-1/2 to 2 packs a day. She experiences shortness of breath when walking quickly, attributing it partly to anxiety about her current health situation.  She recently saw her primary care practitioner, who noted extremely low B12 levels and prescribed B12 injections and vitamin D  supplementation. She was also given an albuterol  inhaler, although she does not feel she needs it regularly.   She is employed at the [employer].  Available imaging was shown to the patient.   Review of Systems A 10 point review of systems was performed and it is as noted above otherwise negative.   Past Medical History:  Diagnosis Date   Anxiety    Carpal tunnel syndrome    right wrist   Dyspnea    GERD (gastroesophageal reflux disease)    Hyperlipidemia    Hypertension 20 yrs   Raynaud's phenomenon    Rheumatoid arteritis (HCC)    Ulcer     Past Surgical History:  Procedure Laterality Date   ABDOMINAL HYSTERECTOMY  1992   BREAST BIOPSY Right 2011   neg   BREAST BIOPSY Right 11/1996   Dense fibrosis,  benign epithelial hyperplasia.   CARPAL TUNNEL RELEASE Right 2012   COLONOSCOPY  2010   ENDOBRONCHIAL ULTRASOUND Right 01/25/2018   Procedure: ENDOBRONCHIAL ULTRASOUND;  Surgeon: Tamea Dedra CROME, MD;  Location: [facility] ORS;  Service: Cardiopulmonary;  Laterality: Right;  TONSILLECTOMY     TUBAL LIGATION     UPPER GI ENDOSCOPY      Patient Active Problem List   Diagnosis Date Noted   Type 2 diabetes mellitus with hyperglycemia, without long-term current use of insulin (HCC) 09/08/2023   B12 deficiency 09/08/2023   Vitamin D  deficiency 09/08/2023   Mixed hyperlipidemia 07/07/2023   Gastroesophageal reflux disease without esophagitis 07/07/2023   Bilateral leg pain 07/07/2023   Family  history of diabetes mellitus (DM) 07/07/2023   Family history of breast cancer in first degree relative 07/07/2023   Subarachnoid hemorrhage following injury, no loss of consciousness (HCC) 07/31/2020   Traumatic subarachnoid hemorrhage with loss of consciousness of 30 minutes or less (HCC) 07/31/2020   Myalgia 02/12/2020   Primary insomnia 11/26/2019   Smoker 08/21/2019   Simple chronic bronchitis (HCC) 08/11/2019   Essential hypertension 08/11/2019   Raynaud's disease without gangrene 08/11/2019   History of colonic polyps 11/11/2016   Personal history of tobacco use, presenting hazards to health 06/26/2016   Annual physical exam 10/11/2013   Encounter for screening mammogram for high-risk patient 08/31/2012    Family History  Problem Relation Age of Onset   Heart disease Mother    Atrial fibrillation Mother    Heart disease Father    Heart attack Father    Breast cancer Sister 58   Breast cancer Maternal Aunt     Social History   Tobacco Use   Smoking status: Every Day    Current packs/day: 2.00    Average packs/day: 2.0 packs/day for 49.7 years (99.3 ttl pk-yrs)    Types: Cigarettes    Start date: 1976   Smokeless tobacco: Never   Tobacco comments:    2 PPD  Substance Use Topics   Alcohol use: No    Allergies  Allergen Reactions   Penicillins Hives and Other (See Comments)    DID THE REACTION INVOLVE: Swelling of the face/tongue/throat, SOB, or low BP? No  Sudden or severe rash/hives, skin peeling, or the inside of the mouth or nose? No  Did it require medical treatment? No  When did it last happen?      unkn  If all above answers are "NO", may proceed with cephalosporin use.    Current Meds  Medication Sig   albuterol  (VENTOLIN  HFA) 108 (90 Base) MCG/ACT inhaler Inhale 1-2 puffs into the lungs every 6 (six) hours as needed.   amLODipine  (NORVASC ) 2.5 MG tablet Take 1 tablet (2.5 mg total) by mouth daily.   cyanocobalamin  (VITAMIN B12) 1000 MCG/ML  injection Inject 1 ml intramuscularly every 7 days x2 doses then decrease to every 30 days x5 doses.   pantoprazole  (PROTONIX ) 40 MG tablet Take 1 tablet (40 mg total) by mouth daily.   rosuvastatin  (CRESTOR ) 20 MG tablet Take 1 tablet (20 mg total) by mouth daily.   Syringe/Needle, Disp, (SYRINGE 3CC/25GX5/8) 25G X 5/8 3 ML MISC Use 1 needle and syringe to draw up medication out of the vial, changing needle prior to injecting medication into skin, use with injectable B12.   traMADol  (ULTRAM ) 50 MG tablet Take 1 tablet (50 mg total) by mouth 2 (two) times daily as needed for moderate pain (pain score 4-6) or severe pain (pain score 7-10).   Vitamin D , Ergocalciferol , (DRISDOL ) 1.25 MG (50000 UNIT) CAPS capsule Take 1 capsule (50,000 Units total) by mouth every 7 (seven) days.    Immunization History  Administered Date(s) Administered   Influenza,inj,Quad PF,6+ Mos 10/08/2017  Influenza-Unspecified 09/30/2018, 10/24/2019, 10/16/2021   PFIZER(Purple Top)SARS-COV-2 Vaccination 01/24/2019, 02/14/2019, 11/17/2019   Tdap 10/28/2020        Objective:     BP 130/64   Pulse 92   Temp 97.6 F (36.4 C) (Temporal)   Ht 5' 3 (1.6 m)   Wt 108 lb 9.6 oz (49.3 kg)   SpO2 95%   BMI 19.24 kg/m   SpO2: 95 %  GENERAL: Well-developed, well-nourished woman, no acute distress. HEAD: Normocephalic, atraumatic.  EYES: Pupils equal, round, reactive to light.  No scleral icterus.  MOUTH: Poor dentition, some missing teeth, some chipped teeth.  Oral mucosa moist. NECK: Supple. No thyromegaly. Trachea midline. No JVD.  No adenopathy. PULMONARY: Good air entry bilaterally.  Coarse, otherwise no adventitious sounds. CARDIOVASCULAR: S1 and S2. Regular rate and rhythm.  No rubs, murmurs or gallops heard. ABDOMEN: Benign. MUSCULOSKELETAL: No joint deformity, no clubbing, no edema.  NEUROLOGIC: No overt focal deficit, no gait disturbance, speech is fluent. SKIN: Intact,warm,dry. PSYCH: Mood and behavior  normal.  Representative images from CT performed for August 2025 showing the right upper lobe lung nodule (arrow):   Soft tissue windows, query calcifications within the nodule:    Representative image from PET/CT performed 06 September 2023 showing metabolic activity on the right hilum (arrow):   Representative image from PET/CT performed 06 September 2023 showing low-level metabolic activity on the right lower lobe lung nodule (arrow):   Assessment & Plan:     ICD-10-CM   1. Lung nodule  R91.1 CT SUPER D CHEST WO [facility] PILOT    2. Mediastinal adenopathy  R59.0     3. Tobacco dependence due to cigarettes  F17.210       Orders Placed This Encounter  Procedures   CT SUPER D CHEST WO [facility] PILOT    Standing Status:   Future    Expiration Date:   09/13/2024    Scheduling Instructions:     Please do before 20 September 2023.  Patient has robotic assisted navigational bronchoscopy on 20 September 2023.    Preferred imaging location?:   [facility]   Discussion:    Right upper lobe pulmonary nodule with increasing size and enlarged right hilar lymph node with metabolic activity The right pulmonary nodule has increased in size from 9 mm to 11 mm over two years and now shows increased activity (weak but present). The right hilar lymph node, previously biopsied as negative, also exhibits increased activity. This change necessitates further investigation to rule out malignancy. Differential includes hamartoma versus slow-growing tumor for the right upper lobe nodule, and malignancy versus reactive inflammatory change on right hilar lymphadenopathy. - Order CT scan for airway mapping prior to biopsy ([facility] protocol CT). - Schedule robotic-assisted bronchoscopy and endobronchial ultrasound with biopsy for 8 September. - Biopsy both the right pulmonary nodule and the right hilar lymph node. - She understands procedure will be done under general anesthesia. - Coordinate with scheduler  to arrange CT scan for Friday, late afternoon, per patient preference. - Discussed potential risks of procedure to include pneumothorax, though risk is low (1-3%).  Tobacco use disorder Continues tobacco use despite known risks.    Advised if symptoms do not improve or worsen, to please contact office for sooner follow up or seek emergency care.    I spent 60 minutes of dedicated to the care of this patient on the date of this encounter to include pre-visit review of records, face-to-face time with the patient discussing conditions above, post  visit ordering of testing, clinical documentation with the electronic health record, making appropriate referrals as documented, and communicating necessary findings to members of the patients care team.   C. Leita Sanders, MD Advanced Bronchoscopy PCCM [facility]    *This note was dictated using voice recognition software/Dragon.  Despite best efforts to proofread, errors can occur which can change the meaning. Any transcriptional errors that result from this process are unintentional and may not be fully corrected at the time of dictation.

## 2023-09-14 NOTE — Patient Instructions (Signed)
 VISIT SUMMARY:  Today, we discussed the changes in your lung nodule and hilar lymph node, your seasonal allergies, and your tobacco use. We also reviewed your recent low B12 levels and the treatments prescribed by your primary care doctor.  YOUR PLAN:  -RIGHT PULMONARY NODULE AND ENLARGED RIGHT HILAR LYMPH NODE: Your lung nodule has increased in size and activity, and the hilar lymph node also shows increased activity. This requires further investigation to rule out cancer. We will perform a CT scan for airway mapping before a robotic-assisted bronchoscopy and biopsy next Monday. Both the nodule and lymph node will be biopsied under general anesthesia. The risk of lung collapse (pneumothorax) is low, but we discussed it as a potential risk.  -TOBACCO USE DISORDER: You continue to smoke despite the known health risks. Quitting smoking is highly recommended to improve your overall health and reduce respiratory symptoms.  -ALLERGIC RHINITIS DUE TO RAGWEED: You experience worsening allergy symptoms during the fall due to ragweed, which affects your breathing. Managing your allergies can help improve your respiratory health.  INSTRUCTIONS:  Please ensure you have your CT scan on Friday afternoon and attend the scheduled biopsy next Monday. Continue with your B12 injections and vitamin D  supplementation as prescribed by your primary care doctor. Use the albuterol  inhaler as needed for shortness of breath.  We discussed that the procedure would have to be done under general anesthesia. The anesthesia team will discuss his part of the process with you.  Complications from the procedure itself are usually minor. One potential complication would be collapse of the lung which can occur in 1-3% of the cases. If  this happens we would have to put a small tube to relieve the collapse and you would have to spend the night in the hospital in that event.  Another possibility would be that of bleeding, this is usually  taken care of during the procedure.  In the situations you may need to be observed overnight.  For the most part though, should be able to go home the same day.  Other possibilities could include that the procedure would be nondiagnostic meaning that no definitive diagnosis could be attained.  We strive to try to decrease these potential issues.

## 2023-09-14 NOTE — H&P (View-Only) (Signed)
 Subjective:    Patient ID: Tracey Walters, female    DOB: 06/23/1960, 63 y.o.   MRN: [id number]  Patient Care Team: Liana Fish, NP as PCP - General (Nurse Practitioner) Dessa Reyes ORN, MD (General Surgery)  Chief Complaint  Patient presents with   Consult    BACKGROUND: Patient is a 63 year old current smoker with a history as noted below, who presents for evaluation of a lung nodule and mediastinal adenopathy.  Previously evaluated in 2019 with bronchoscopy with endobronchial ultrasound on 25 January 2018 with sampling of station 10R which was negative for malignancy.  The patient continued to follow with lung cancer screening CTs and was lost to follow-up pulmonary.  She presents today for evaluation of an enlarging right lower lobe nodule and mediastinal adenopathy (right hilar).   HPI Discussed the use of AI scribe software for clinical note transcription with the patient, who gave verbal consent to proceed.  History of Present Illness   Tracey Walters is a 63 year old female who presents for evaluation of mediastinal adenopathy and right lower lobe nodule.  She is kindly referred by Fish Liana, NP.  The lung nodule, previously stable, has changed over two years. Recent CT and PET scans have shown new activity in the nodule and hilar region, prompting further investigation. A prior biopsy of the hilar region (station 10R) was negative, but the new activity necessitates another biopsy.  On her imaging in 2019 the nodule was only 9 mm in size and not amenable to biopsy by the available technology at the time.  PET/CT at the time showed weak uptake on the nodule but higher uptake on the right hilar region.  After EBUS the patient was advised to continue following closely however she initially declined further workup and then was eventually lost to follow-up.  She experiences seasonal allergies, particularly during the fall due to ragweed, which exacerbates her  respiratory symptoms. This time of year is particularly challenging for her respiratory health, as she has had pneumonia in the past during this season.  She continues to smoke, she smokes anywhere between 1-1/2 to 2 packs a day. She experiences shortness of breath when walking quickly, attributing it partly to anxiety about her current health situation.  She recently saw her primary care practitioner, who noted extremely low B12 levels and prescribed B12 injections and vitamin D  supplementation. She was also given an albuterol  inhaler, although she does not feel she needs it regularly.   She is employed at the [employer].  Available imaging was shown to the patient.   Review of Systems A 10 point review of systems was performed and it is as noted above otherwise negative.   Past Medical History:  Diagnosis Date   Anxiety    Carpal tunnel syndrome    right wrist   Dyspnea    GERD (gastroesophageal reflux disease)    Hyperlipidemia    Hypertension 20 yrs   Raynaud's phenomenon    Rheumatoid arteritis (HCC)    Ulcer     Past Surgical History:  Procedure Laterality Date   ABDOMINAL HYSTERECTOMY  1992   BREAST BIOPSY Right 2011   neg   BREAST BIOPSY Right 11/1996   Dense fibrosis,  benign epithelial hyperplasia.   CARPAL TUNNEL RELEASE Right 2012   COLONOSCOPY  2010   ENDOBRONCHIAL ULTRASOUND Right 01/25/2018   Procedure: ENDOBRONCHIAL ULTRASOUND;  Surgeon: Tamea Dedra CROME, MD;  Location: [facility] ORS;  Service: Cardiopulmonary;  Laterality: Right;  TONSILLECTOMY     TUBAL LIGATION     UPPER GI ENDOSCOPY      Patient Active Problem List   Diagnosis Date Noted   Type 2 diabetes mellitus with hyperglycemia, without long-term current use of insulin (HCC) 09/08/2023   B12 deficiency 09/08/2023   Vitamin D  deficiency 09/08/2023   Mixed hyperlipidemia 07/07/2023   Gastroesophageal reflux disease without esophagitis 07/07/2023   Bilateral leg pain 07/07/2023   Family  history of diabetes mellitus (DM) 07/07/2023   Family history of breast cancer in first degree relative 07/07/2023   Subarachnoid hemorrhage following injury, no loss of consciousness (HCC) 07/31/2020   Traumatic subarachnoid hemorrhage with loss of consciousness of 30 minutes or less (HCC) 07/31/2020   Myalgia 02/12/2020   Primary insomnia 11/26/2019   Smoker 08/21/2019   Simple chronic bronchitis (HCC) 08/11/2019   Essential hypertension 08/11/2019   Raynaud's disease without gangrene 08/11/2019   History of colonic polyps 11/11/2016   Personal history of tobacco use, presenting hazards to health 06/26/2016   Annual physical exam 10/11/2013   Encounter for screening mammogram for high-risk patient 08/31/2012    Family History  Problem Relation Age of Onset   Heart disease Mother    Atrial fibrillation Mother    Heart disease Father    Heart attack Father    Breast cancer Sister 58   Breast cancer Maternal Aunt     Social History   Tobacco Use   Smoking status: Every Day    Current packs/day: 2.00    Average packs/day: 2.0 packs/day for 49.7 years (99.3 ttl pk-yrs)    Types: Cigarettes    Start date: 1976   Smokeless tobacco: Never   Tobacco comments:    2 PPD  Substance Use Topics   Alcohol use: No    Allergies  Allergen Reactions   Penicillins Hives and Other (See Comments)    DID THE REACTION INVOLVE: Swelling of the face/tongue/throat, SOB, or low BP? No  Sudden or severe rash/hives, skin peeling, or the inside of the mouth or nose? No  Did it require medical treatment? No  When did it last happen?      unkn  If all above answers are "NO", may proceed with cephalosporin use.    Current Meds  Medication Sig   albuterol  (VENTOLIN  HFA) 108 (90 Base) MCG/ACT inhaler Inhale 1-2 puffs into the lungs every 6 (six) hours as needed.   amLODipine  (NORVASC ) 2.5 MG tablet Take 1 tablet (2.5 mg total) by mouth daily.   cyanocobalamin  (VITAMIN B12) 1000 MCG/ML  injection Inject 1 ml intramuscularly every 7 days x2 doses then decrease to every 30 days x5 doses.   pantoprazole  (PROTONIX ) 40 MG tablet Take 1 tablet (40 mg total) by mouth daily.   rosuvastatin  (CRESTOR ) 20 MG tablet Take 1 tablet (20 mg total) by mouth daily.   Syringe/Needle, Disp, (SYRINGE 3CC/25GX5/8) 25G X 5/8 3 ML MISC Use 1 needle and syringe to draw up medication out of the vial, changing needle prior to injecting medication into skin, use with injectable B12.   traMADol  (ULTRAM ) 50 MG tablet Take 1 tablet (50 mg total) by mouth 2 (two) times daily as needed for moderate pain (pain score 4-6) or severe pain (pain score 7-10).   Vitamin D , Ergocalciferol , (DRISDOL ) 1.25 MG (50000 UNIT) CAPS capsule Take 1 capsule (50,000 Units total) by mouth every 7 (seven) days.    Immunization History  Administered Date(s) Administered   Influenza,inj,Quad PF,6+ Mos 10/08/2017  Influenza-Unspecified 09/30/2018, 10/24/2019, 10/16/2021   PFIZER(Purple Top)SARS-COV-2 Vaccination 01/24/2019, 02/14/2019, 11/17/2019   Tdap 10/28/2020        Objective:     BP 130/64   Pulse 92   Temp 97.6 F (36.4 C) (Temporal)   Ht 5' 3 (1.6 m)   Wt 108 lb 9.6 oz (49.3 kg)   SpO2 95%   BMI 19.24 kg/m   SpO2: 95 %  GENERAL: Well-developed, well-nourished woman, no acute distress. HEAD: Normocephalic, atraumatic.  EYES: Pupils equal, round, reactive to light.  No scleral icterus.  MOUTH: Poor dentition, some missing teeth, some chipped teeth.  Oral mucosa moist. NECK: Supple. No thyromegaly. Trachea midline. No JVD.  No adenopathy. PULMONARY: Good air entry bilaterally.  Coarse, otherwise no adventitious sounds. CARDIOVASCULAR: S1 and S2. Regular rate and rhythm.  No rubs, murmurs or gallops heard. ABDOMEN: Benign. MUSCULOSKELETAL: No joint deformity, no clubbing, no edema.  NEUROLOGIC: No overt focal deficit, no gait disturbance, speech is fluent. SKIN: Intact,warm,dry. PSYCH: Mood and behavior  normal.  Representative images from CT performed for August 2025 showing the right upper lobe lung nodule (arrow):   Soft tissue windows, query calcifications within the nodule:    Representative image from PET/CT performed 06 September 2023 showing metabolic activity on the right hilum (arrow):   Representative image from PET/CT performed 06 September 2023 showing low-level metabolic activity on the right lower lobe lung nodule (arrow):   Assessment & Plan:     ICD-10-CM   1. Lung nodule  R91.1 CT SUPER D CHEST WO [facility] PILOT    2. Mediastinal adenopathy  R59.0     3. Tobacco dependence due to cigarettes  F17.210       Orders Placed This Encounter  Procedures   CT SUPER D CHEST WO [facility] PILOT    Standing Status:   Future    Expiration Date:   09/13/2024    Scheduling Instructions:     Please do before 20 September 2023.  Patient has robotic assisted navigational bronchoscopy on 20 September 2023.    Preferred imaging location?:   [facility]   Discussion:    Right upper lobe pulmonary nodule with increasing size and enlarged right hilar lymph node with metabolic activity The right pulmonary nodule has increased in size from 9 mm to 11 mm over two years and now shows increased activity (weak but present). The right hilar lymph node, previously biopsied as negative, also exhibits increased activity. This change necessitates further investigation to rule out malignancy. Differential includes hamartoma versus slow-growing tumor for the right upper lobe nodule, and malignancy versus reactive inflammatory change on right hilar lymphadenopathy. - Order CT scan for airway mapping prior to biopsy ([facility] protocol CT). - Schedule robotic-assisted bronchoscopy and endobronchial ultrasound with biopsy for 8 September. - Biopsy both the right pulmonary nodule and the right hilar lymph node. - She understands procedure will be done under general anesthesia. - Coordinate with scheduler  to arrange CT scan for Friday, late afternoon, per patient preference. - Discussed potential risks of procedure to include pneumothorax, though risk is low (1-3%).  Tobacco use disorder Continues tobacco use despite known risks.    Advised if symptoms do not improve or worsen, to please contact office for sooner follow up or seek emergency care.    I spent 60 minutes of dedicated to the care of this patient on the date of this encounter to include pre-visit review of records, face-to-face time with the patient discussing conditions above, post  visit ordering of testing, clinical documentation with the electronic health record, making appropriate referrals as documented, and communicating necessary findings to members of the patients care team.   C. Leita Sanders, MD Advanced Bronchoscopy PCCM [facility]    *This note was dictated using voice recognition software/Dragon.  Despite best efforts to proofread, errors can occur which can change the meaning. Any transcriptional errors that result from this process are unintentional and may not be fully corrected at the time of dictation.

## 2023-09-15 NOTE — Telephone Encounter (Signed)
 For the codes 68372, [id number], [phone] Prior Auth Not Required Refer # [id number]

## 2023-09-15 NOTE — Telephone Encounter (Signed)
 Noted. Nothing further needed.

## 2023-09-16 ENCOUNTER — Encounter
Admission: RE | Admit: 2023-09-16 | Discharge: 2023-09-16 | Disposition: A | Discharge status: Home / Self Care | Source: Ambulatory Visit | Attending: Pulmonary Disease | Admitting: Pulmonary Disease

## 2023-09-16 ENCOUNTER — Other Ambulatory Visit: Payer: Self-pay

## 2023-09-16 DIAGNOSIS — E1165 Type 2 diabetes mellitus with hyperglycemia: Secondary | ICD-10-CM

## 2023-09-16 HISTORY — DX: Type 2 diabetes mellitus without complications: E11.9

## 2023-09-16 HISTORY — DX: Cardiac murmur, unspecified: R01.1

## 2023-09-16 HISTORY — DX: Pneumonia, unspecified organism: J18.9

## 2023-09-16 HISTORY — DX: Atherosclerotic heart disease of native coronary artery without angina pectoris: I25.10

## 2023-09-16 HISTORY — DX: Solitary pulmonary nodule: R91.1

## 2023-09-16 HISTORY — DX: Localized enlarged lymph nodes: R59.0

## 2023-09-16 NOTE — Telephone Encounter (Signed)
 Reminder set to monitor for results.

## 2023-09-16 NOTE — Patient Instructions (Addendum)
 Your procedure is scheduled on: 09/20/23 - Monday Report to the Registration Desk on the 1st floor of the [facility]. To find out your arrival time, please call [phone] between 1PM - 3PM on: 09/17/23 - Friday If your arrival time is 6:00 am, do not arrive before that time as the [facility] entrance doors do not open until 6:00 am.  REMEMBER: Instructions that are not followed completely may result in serious medical risk, up to and including death; or upon the discretion of your surgeon and anesthesiologist your surgery may need to be rescheduled.  Do not eat food or drink any liquids after midnight the night before surgery.  No gum chewing or hard candies.  One week prior to surgery: Stop Anti-inflammatories (NSAIDS) such as Advil , Aleve , Ibuprofen , Motrin , Naproxen , Naprosyn  and Aspirin based products such as Excedrin, Goody's Powder, BC Powder. You may take Tylenol  if needed for pain up until the day of surgery.  Stop ANY OVER THE COUNTER supplements until after surgery.  ON THE DAY OF SURGERY ONLY TAKE THESE MEDICATIONS WITH SIPS OF WATER:  amLODipine  (NORVASC ) pantoprazole  (PROTONIX )   traMADol  (ULTRAM ) if needed  Use inhalers on the day of surgery and bring to the hospital.  No Alcohol for 24 hours before or after surgery.  No Smoking including e-cigarettes for 24 hours before surgery.  No chewable tobacco products for at least 6 hours before surgery.  No nicotine patches on the day of surgery.  Do not use any recreational drugs for at least a week (preferably 2 weeks) before your surgery.  Please be advised that the combination of cocaine and anesthesia may have negative outcomes, up to and including death. If you test positive for cocaine, your surgery will be cancelled.  On the morning of surgery brush your teeth with toothpaste and water, you may rinse your mouth with mouthwash if you wish. Do not swallow any toothpaste or mouthwash.  Do not wear jewelry,  make-up, hairpins, clips or nail polish.  For welded (permanent) jewelry: bracelets, anklets, waist bands, etc.  Please have this removed prior to surgery.  If it is not removed, there is a chance that hospital personnel will need to cut it off on the day of surgery.  Do not wear lotions, powders, or perfumes.   Do not shave body hair from the neck down 48 hours before surgery.  Contact lenses, hearing aids and dentures may not be worn into surgery.  Do not bring valuables to the hospital. [facility] is not responsible for any missing/lost belongings or valuables.   Notify your doctor if there is any change in your medical condition (cold, fever, infection).  Wear comfortable clothing (specific to your surgery type) to the hospital.  After surgery, you can help prevent lung complications by doing breathing exercises.  Take deep breaths and cough every 1-2 hours. Your doctor may order a device called an Incentive Spirometer to help you take deep breaths. When coughing or sneezing, hold a pillow firmly against your incision with both hands. This is called "splinting." Doing this helps protect your incision. It also decreases belly discomfort.  If you are being admitted to the hospital overnight, leave your suitcase in the car. After surgery it may be brought to your room.  In case of increased patient census, it may be necessary for you, the patient, to continue your postoperative care in the Same Day Surgery department.  If you are being discharged the day of surgery, you will not  be allowed to drive home. You will need a responsible individual to drive you home and stay with you for 24 hours after surgery.   If you are taking public transportation, you will need to have a responsible individual with you.  Please call the Pre-admissions Testing Dept. at [phone] if you have any questions about these instructions.  Surgery Visitation Policy:  Patients having surgery or a  procedure may have two visitors.  Children under the age of 44 must have an adult with them who is not the patient.  Inpatient Visitation:    Visiting hours are 7 a.m. to 8 p.m. Up to four visitors are allowed at one time in a patient room. The visitors may rotate out with other people during the day.  One visitor age 69 or older may stay with the patient overnight and must be in the room by 8 p.m.   Merchandiser, retail to address health-related social needs:  [email or web address]

## 2023-09-17 ENCOUNTER — Ambulatory Visit
Admission: RE | Admit: 2023-09-17 | Discharge: 2023-09-17 | Disposition: A | Discharge status: Home / Self Care | Source: Ambulatory Visit | Attending: Pulmonary Disease | Admitting: Pulmonary Disease

## 2023-09-17 DIAGNOSIS — J432 Centrilobular emphysema: Secondary | ICD-10-CM | POA: Diagnosis not present

## 2023-09-17 DIAGNOSIS — R911 Solitary pulmonary nodule: Secondary | ICD-10-CM | POA: Diagnosis not present

## 2023-09-17 DIAGNOSIS — R590 Localized enlarged lymph nodes: Secondary | ICD-10-CM | POA: Diagnosis not present

## 2023-09-20 ENCOUNTER — Ambulatory Visit

## 2023-09-20 ENCOUNTER — Other Ambulatory Visit: Payer: Self-pay

## 2023-09-20 ENCOUNTER — Ambulatory Visit
Admission: RE | Admit: 2023-09-20 | Discharge: 2023-09-20 | Disposition: A | Discharge status: Home / Self Care | Attending: Pulmonary Disease | Admitting: Pulmonary Disease

## 2023-09-20 ENCOUNTER — Encounter
Admission: RE | Disposition: A | Discharge status: Home / Self Care | Payer: Self-pay | Source: Home / Self Care | Attending: Pulmonary Disease

## 2023-09-20 ENCOUNTER — Encounter: Payer: Self-pay | Admitting: Pulmonary Disease

## 2023-09-20 DIAGNOSIS — Z8701 Personal history of pneumonia (recurrent): Secondary | ICD-10-CM | POA: Diagnosis not present

## 2023-09-20 DIAGNOSIS — Z8673 Personal history of transient ischemic attack (TIA), and cerebral infarction without residual deficits: Secondary | ICD-10-CM | POA: Diagnosis not present

## 2023-09-20 DIAGNOSIS — R590 Localized enlarged lymph nodes: Secondary | ICD-10-CM | POA: Diagnosis not present

## 2023-09-20 DIAGNOSIS — J984 Other disorders of lung: Secondary | ICD-10-CM | POA: Diagnosis not present

## 2023-09-20 DIAGNOSIS — F17210 Nicotine dependence, cigarettes, uncomplicated: Secondary | ICD-10-CM | POA: Diagnosis not present

## 2023-09-20 DIAGNOSIS — Z79899 Other long term (current) drug therapy: Secondary | ICD-10-CM | POA: Insufficient documentation

## 2023-09-20 DIAGNOSIS — E538 Deficiency of other specified B group vitamins: Secondary | ICD-10-CM | POA: Insufficient documentation

## 2023-09-20 DIAGNOSIS — I2510 Atherosclerotic heart disease of native coronary artery without angina pectoris: Secondary | ICD-10-CM | POA: Insufficient documentation

## 2023-09-20 DIAGNOSIS — R896 Abnormal cytological findings in specimens from other organs, systems and tissues: Secondary | ICD-10-CM | POA: Diagnosis not present

## 2023-09-20 DIAGNOSIS — J189 Pneumonia, unspecified organism: Secondary | ICD-10-CM | POA: Diagnosis not present

## 2023-09-20 DIAGNOSIS — J302 Other seasonal allergic rhinitis: Secondary | ICD-10-CM | POA: Diagnosis not present

## 2023-09-20 DIAGNOSIS — R911 Solitary pulmonary nodule: Secondary | ICD-10-CM | POA: Diagnosis not present

## 2023-09-20 DIAGNOSIS — K219 Gastro-esophageal reflux disease without esophagitis: Secondary | ICD-10-CM | POA: Diagnosis not present

## 2023-09-20 DIAGNOSIS — Z48813 Encounter for surgical aftercare following surgery on the respiratory system: Secondary | ICD-10-CM | POA: Diagnosis not present

## 2023-09-20 DIAGNOSIS — J9811 Atelectasis: Secondary | ICD-10-CM | POA: Diagnosis not present

## 2023-09-20 DIAGNOSIS — I10 Essential (primary) hypertension: Secondary | ICD-10-CM | POA: Diagnosis not present

## 2023-09-20 DIAGNOSIS — E119 Type 2 diabetes mellitus without complications: Secondary | ICD-10-CM | POA: Insufficient documentation

## 2023-09-20 DIAGNOSIS — S2231XA Fracture of one rib, right side, initial encounter for closed fracture: Secondary | ICD-10-CM | POA: Diagnosis not present

## 2023-09-20 DIAGNOSIS — E1165 Type 2 diabetes mellitus with hyperglycemia: Secondary | ICD-10-CM

## 2023-09-20 HISTORY — PX: ENDOBRONCHIAL ULTRASOUND: SHX5096

## 2023-09-20 HISTORY — PX: VIDEO BRONCHOSCOPY WITH ENDOBRONCHIAL NAVIGATION: SHX6175

## 2023-09-20 SURGERY — VIDEO BRONCHOSCOPY WITH ENDOBRONCHIAL NAVIGATION
Anesthesia: General | Laterality: Right

## 2023-09-20 MED ORDER — PROPOFOL 1000 MG/100ML IV EMUL
INTRAVENOUS | Status: AC
  Filled 2023-09-20: qty 100

## 2023-09-20 MED ORDER — IPRATROPIUM-ALBUTEROL 0.5-2.5 (3) MG/3ML IN SOLN
3.0000 mL | Freq: Once | RESPIRATORY_TRACT | Status: AC
  Administered 2023-09-20: 3 mL via RESPIRATORY_TRACT

## 2023-09-20 MED ORDER — IPRATROPIUM-ALBUTEROL 0.5-2.5 (3) MG/3ML IN SOLN
RESPIRATORY_TRACT | Status: AC
Start: 2023-09-20 — End: 2023-09-20
  Filled 2023-09-20: qty 3

## 2023-09-20 MED ORDER — IPRATROPIUM-ALBUTEROL 0.5-2.5 (3) MG/3ML IN SOLN: 3.0000 mL | Freq: Once | RESPIRATORY_TRACT | Status: DC

## 2023-09-20 MED ORDER — PHENYLEPHRINE 80 MCG/ML (10ML) SYRINGE FOR IV PUSH (FOR BLOOD PRESSURE SUPPORT)
PREFILLED_SYRINGE | INTRAVENOUS | Status: DC | PRN
  Administered 2023-09-20: 80 ug via INTRAVENOUS
  Administered 2023-09-20 (×2): 120 ug via INTRAVENOUS

## 2023-09-20 MED ORDER — PROPOFOL 500 MG/50ML IV EMUL
INTRAVENOUS | Status: DC | PRN
  Administered 2023-09-20: 150 ug/kg/min via INTRAVENOUS

## 2023-09-20 MED ORDER — CHLORHEXIDINE GLUCONATE 0.12 % MT SOLN: 15.0000 mL | Freq: Once | OROMUCOSAL | Status: DC

## 2023-09-20 MED ORDER — FENTANYL CITRATE (PF) 100 MCG/2ML IJ SOLN: 25.0000 ug | INTRAMUSCULAR | Status: DC | PRN

## 2023-09-20 MED ORDER — SUGAMMADEX SODIUM 200 MG/2ML IV SOLN
INTRAVENOUS | Status: DC | PRN
  Administered 2023-09-20: 100 mg via INTRAVENOUS

## 2023-09-20 MED ORDER — SODIUM CHLORIDE 0.9 % IV SOLN: INTRAVENOUS | Status: DC

## 2023-09-20 MED ORDER — LACTATED RINGERS IV SOLN: INTRAVENOUS | Status: DC | PRN

## 2023-09-20 MED ORDER — OXYCODONE HCL 5 MG/5ML PO SOLN: 5.0000 mg | Freq: Once | ORAL | Status: DC | PRN

## 2023-09-20 MED ORDER — ORAL CARE MOUTH RINSE: 15.0000 mL | Freq: Once | OROMUCOSAL | Status: DC

## 2023-09-20 MED ORDER — DROPERIDOL 2.5 MG/ML IJ SOLN: 0.6250 mg | Freq: Once | INTRAMUSCULAR | Status: DC | PRN

## 2023-09-20 MED ORDER — PROPOFOL 10 MG/ML IV BOLUS
INTRAVENOUS | Status: AC
  Filled 2023-09-20: qty 20

## 2023-09-20 MED ORDER — SODIUM CHLORIDE 0.9 % IV SOLN: Freq: Once | INTRAVENOUS | Status: DC

## 2023-09-20 MED ORDER — ONDANSETRON HCL 4 MG/2ML IJ SOLN
INTRAMUSCULAR | Status: DC | PRN
  Administered 2023-09-20: 4 mg via INTRAVENOUS

## 2023-09-20 MED ORDER — ROCURONIUM BROMIDE 100 MG/10ML IV SOLN
INTRAVENOUS | Status: DC | PRN
  Administered 2023-09-20: 50 mg via INTRAVENOUS

## 2023-09-20 MED ORDER — DEXAMETHASONE SODIUM PHOSPHATE 10 MG/ML IJ SOLN
INTRAMUSCULAR | Status: DC | PRN
  Administered 2023-09-20: 5 mg via INTRAVENOUS

## 2023-09-20 MED ORDER — MIDAZOLAM HCL 2 MG/2ML IJ SOLN
INTRAMUSCULAR | Status: DC | PRN
  Administered 2023-09-20: 2 mg via INTRAVENOUS

## 2023-09-20 MED ORDER — ACETAMINOPHEN 10 MG/ML IV SOLN: 1000.0000 mg | Freq: Once | INTRAVENOUS | Status: DC | PRN

## 2023-09-20 MED ORDER — ESMOLOL HCL 100 MG/10ML IV SOLN
INTRAVENOUS | Status: DC | PRN
  Administered 2023-09-20: 20 mg via INTRAVENOUS

## 2023-09-20 MED ORDER — FENTANYL CITRATE (PF) 100 MCG/2ML IJ SOLN
INTRAMUSCULAR | Status: DC | PRN
  Administered 2023-09-20: 100 ug via INTRAVENOUS

## 2023-09-20 MED ORDER — MIDAZOLAM HCL 2 MG/2ML IJ SOLN
INTRAMUSCULAR | Status: AC
  Filled 2023-09-20: qty 2

## 2023-09-20 MED ORDER — PROPOFOL 10 MG/ML IV BOLUS
INTRAVENOUS | Status: DC | PRN
  Administered 2023-09-20: 100 mg via INTRAVENOUS

## 2023-09-20 MED ORDER — FENTANYL CITRATE (PF) 100 MCG/2ML IJ SOLN
INTRAMUSCULAR | Status: AC
  Filled 2023-09-20: qty 2

## 2023-09-20 MED ORDER — LIDOCAINE HCL (CARDIAC) PF 100 MG/5ML IV SOSY
PREFILLED_SYRINGE | INTRAVENOUS | Status: DC | PRN
  Administered 2023-09-20: 50 mg via INTRAVENOUS

## 2023-09-20 MED ORDER — OXYCODONE HCL 5 MG PO TABS: 5.0000 mg | ORAL_TABLET | Freq: Once | ORAL | Status: DC | PRN

## 2023-09-20 NOTE — Anesthesia Procedure Notes (Signed)
 Procedure Name: Intubation Date/Time: 09/20/2023 12:47 PM  Performed by: Brien Sotero PARAS, CRNAPre-anesthesia Checklist: Patient identified, Patient being monitored, Timeout performed, Emergency Drugs available and Suction available Patient Re-evaluated:Patient Re-evaluated prior to induction Oxygen Delivery Method: Circle system utilized Preoxygenation: Pre-oxygenation with 100% oxygen Induction Type: IV induction Ventilation: Mask ventilation without difficulty Laryngoscope Size: 3 and McGrath Grade View: Grade I Tube type: Oral Tube size: 8.5 mm Number of attempts: 1 Airway Equipment and Method: Stylet Placement Confirmation: ETT inserted through vocal cords under direct vision, positive ETCO2 and breath sounds checked- equal and bilateral Secured at: 21 cm Tube secured with: Tape Dental Injury: Teeth and Oropharynx as per pre-operative assessment

## 2023-09-20 NOTE — Anesthesia Preprocedure Evaluation (Signed)
 Anesthesia Evaluation  Patient identified by MRN, date of birth, ID band Patient awake    Reviewed: Allergy & Precautions, H&P , NPO status , Patient's Chart, lab work & pertinent test results, reviewed documented beta blocker date and time   Airway Mallampati: II  TM Distance: >3 FB Neck ROM: full    Dental  (+) Teeth Intact   Pulmonary shortness of breath and with exertion, pneumonia, resolved, Current SmokerPatient did not abstain from smoking.   Pulmonary exam normal        Cardiovascular Exercise Tolerance: Poor hypertension, + CAD  Normal cardiovascular exam+ Valvular Problems/Murmurs  Rhythm:regular Rate:Normal     Neuro/Psych   Anxiety      Neuromuscular disease CVA  negative psych ROS   GI/Hepatic Neg liver ROS,GERD  Medicated,,  Endo/Other  negative endocrine ROSdiabetes, Well Controlled    Renal/GU negative Renal ROS  negative genitourinary   Musculoskeletal   Abdominal   Peds  Hematology negative hematology ROS (+)   Anesthesia Other Findings Past Medical History: No date: Anxiety No date: Carpal tunnel syndrome     Comment:  right wrist No date: Coronary artery disease No date: Diabetes mellitus without complication (HCC) No date: Dyspnea No date: GERD (gastroesophageal reflux disease) No date: Heart murmur No date: Hyperlipidemia 20 yrs: Hypertension No date: Lung nodule     Comment:  right lower lobe No date: Mediastinal adenopathy No date: Pneumonia No date: Raynaud's phenomenon No date: Rheumatoid arteritis (HCC) No date: Ulcer Past Surgical History: 1992: ABDOMINAL HYSTERECTOMY 2011: BREAST BIOPSY; Right     Comment:  neg 11/1996: BREAST BIOPSY; Right     Comment:  Dense fibrosis,  benign epithelial hyperplasia. 2012: CARPAL TUNNEL RELEASE; Right 2010: COLONOSCOPY 01/25/2018: ENDOBRONCHIAL ULTRASOUND; Right     Comment:  Procedure: ENDOBRONCHIAL ULTRASOUND;  Surgeon: Tamea Dedra CROME, MD;  Location: [facility] ORS;  Service:               Cardiopulmonary;  Laterality: Right; No date: TONSILLECTOMY No date: TUBAL LIGATION No date: UPPER GI ENDOSCOPY   Reproductive/Obstetrics negative OB ROS                              Anesthesia Physical Anesthesia Plan  ASA: 3  Anesthesia Plan: General ETT   Post-op Pain Management:    Induction:   PONV Risk Score and Plan: 3  Airway Management Planned:   Additional Equipment:   Intra-op Plan:   Post-operative Plan:   Informed Consent: I have reviewed the patients History and Physical, chart, labs and discussed the procedure including the risks, benefits and alternatives for the proposed anesthesia with the patient or authorized representative who has indicated his/her understanding and acceptance.     Dental Advisory Given  Plan Discussed with: CRNA  Anesthesia Plan Comments:         Anesthesia Quick Evaluation

## 2023-09-20 NOTE — Op Note (Addendum)
 Video Bronchoscopy with Robotic Assisted Bronchoscopic Navigation   Date of Operation: 09/20/2023   Pre-op Diagnosis: Solitary right upper lobe nodule, enlarging and right hilar adenopathy.  Rule out cancer  Post-op Diagnosis: Same as above.  Surgeon: [name], MD  Assistants: Greig Perry, RRT/Rebecca Roddie, Endo Tech  Anesthesia: General endotracheal anesthesia  Operation: Flexible video bronchoscopy with robotic assistance and biopsies.  Estimated Blood Loss: Minimal  Complications: None  Indications and History: Tracey Walters is a 63 y.o. female with history of tobacco abuse and a solitary lung nodule and right hilar adenopathy.  Recommendation made to achieve a tissue diagnosis via robotic assisted navigational bronchoscopy.  The risks, benefits, complications, treatment options and expected outcomes were discussed with the patient.  The possibilities of pneumothorax, pneumonia, reaction to medication, pulmonary aspiration, perforation of a viscus, bleeding, failure to diagnose a condition and creating a complication requiring transfusion or operation were discussed with the patient who freely signed the consent.    Description of Procedure: The patient was seen in the Preoperative Area, was examined and was deemed appropriate to proceed.  The patient was taken to [facility] (Bronchoscopy Suite), identified as Tracey Walters and the procedure verified as Flexible Video Bronchoscopy with robotic assistance and endobronchial ultrasound with TBNA.  A Time Out was held and the above information confirmed.   Prior to the date of the procedure a high-resolution CT scan of the chest was performed. Utilizing ION software program a virtual tracheobronchial tree was generated to allow the creation of distinct navigation pathways to the patient's parenchymal abnormalities. After being taken to the operating room general anesthesia was initiated and the patient  was orally intubated. The  Olympus flexible therapeutic bronchoscope was introduced via the endotracheal tube adapter and a general inspection was performed which showed normal right and left lung anatomy. Aspiration of the bilateral mainstems was completed to remove any remaining secretions. Robotic catheter was then inserted into patient's endotracheal tube.   Target #1 RUL: The distinct navigation pathways prepared prior to this procedure were then utilized to navigate to patient's lesion identified on CT scan. The robotic catheter was secured into place and the vision probe was withdrawn.  Lesion location was approximated using fluoroscopy.  Local registration and targeting was performed using GE 3D CBCT mobile C-arm three-dimensional imaging. Under fluoroscopic guidance,transbronchial needle biopsies, and transbronchial forceps biopsies were performed to be sent for cytology and pathology.  Needle-in-lesion was confirmed using a GE 3D CBCT mobile C-arm.  Radial EBUS was then utilized to further confirm target acquisition.   After the peripheral target was sampled, the robotic portion of the bronchoscopy was completed.  An Olympus endobronchial ultrasound scope was then advanced through the ET tube adapter.  Examination of the mediastinum showed no significant adenopathy on the both hilar areas.  No adenopathy on the precarinal area.  There was a 1.9 cm node noted subcarinal area (station 7) this area was then sampled utilizing a Cook 22-gauge endobronchial ultrasound needle.  A total of 6 passes were obtained and placed in CytoLyt for analysis.  At the end of the procedure a general airway inspection was performed and there was no evidence of active bleeding. The bronchoscope was removed.  The patient tolerated the procedure well. There was no significant blood loss and there were no obvious complications. A post-procedural chest x-ray is pending.  Samples Target #1: 1. Transbronchial needle brushings from N/A 2.  Transbronchial Wang needle biopsies from: RUL x 6 3. Transbronchial  forceps biopsies from N/A 4. Bronchoalveolar lavage from N/A 5. 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 Leita Sanders, MD Advanced Bronchoscopy PCCM  [facility]    *This note was generated using voice recognition software/Dragon and/or AI transcription program.  Despite best efforts to proofread, errors can occur which can change the meaning. Any transcriptional errors that result from this process are unintentional and may not be fully corrected at the time of dictation.

## 2023-09-20 NOTE — Interval H&P Note (Signed)
 Tracey Walters has presented today for surgery, with the diagnosis of RIGHT LUNG NODULE and HILAR ADENOPATHY.  The various methods of treatment have been discussed with the patient and family. After consideration of risks, benefits and other options for treatment, the patient has consented to  Procedure(s): ROBOTIC ASSISTED NAVIGATIONAL BRONCHOSCOPY AND ENDOBRONCHIAL ULTRASOUND- RIGHT as a surgical intervention.  The patient's history has been reviewed, patient examined, no change in status, stable for surgery.  I have reviewed the patient's chart and labs.  Questions were answered to the patient's satisfaction.  Benefits, limitations and potential complications of the procedure were discussed with the patient/family.  Complications from bronchoscopy are rare and most often minor, but if they occur they may include breathing difficulty, vocal cord spasm, hoarseness, slight fever, vomiting, dizziness, bronchospasm, infection, low blood oxygen, bleeding from biopsy site, or an allergic reaction to medications.  It is uncommon for patients to experience other more serious complications for example: Collapsed lung requiring chest tube placement, respiratory failure, heart attack and/or cardiac arrhythmia.  Patient agrees to proceed.  [name], MD Advanced Bronchoscopy PCCM [facility]    *This note was generated using voice recognition software/Dragon and/or AI transcription program.  Despite best efforts to proofread, errors can occur which can change the meaning. Any transcriptional errors that result from this process are unintentional and may not be fully corrected at the time of dictation.

## 2023-09-20 NOTE — Discharge Instructions (Signed)
 Flexible Bronchoscopy, Care After This sheet gives you information about how to care for yourself after your test. Your doctor may also give you more specific instructions. If you have problems or questions, contact your doctor. Follow these instructions at home: Eating and drinking When you are wide awake, your numbness is gone and your cough and gag reflexes have come back, you may: Start eating only soft foods. Slowly drink liquids. Six hours after the test, go back to your normal diet. Driving Do not drive for 24 hours if you were given a medicine to help you relax (sedative). Do not drive or use heavy machinery while taking prescription pain medicine. General instructions Take over-the-counter and prescription medicines only as told by your doctor. Return to your normal activities as told. Ask what activities are safe for you. Do not use any products that have nicotine or tobacco in them. This includes cigarettes and e-cigarettes. If you need help quitting, ask your doctor. Keep all follow-up visits as told by your doctor. This is important. It is very important if you had a tissue sample (biopsy) taken. Get help right away if: You have shortness of breath that gets worse. You get light-headed. You feel like you are going to pass out (faint). You have chest pain. You cough up: More than a little blood. More blood than before. Summary Do not use cigarettes. Do not use e-cigarettes. Seek care in the Emergency Department right away if you have chest pain or shortness of breath. Call or MyChart Message our office for any questions or problems at [phone].    This information is not intended to replace advice given to you by your health care provider. Make sure you discuss any questions you have with your health care provider.

## 2023-09-20 NOTE — Procedures (Signed)
 Date of Procedure: 08/23/2023    Pre-op Diagnosis: Enlarging right upper lobe lung nodule, hilar adenopathy  Post-op Diagnosis: Same as above   Surgeon: [name], MD  Assistants: Greig Perry, RRT/Rebecca Roddie, Endo Tech  Fluoroscopy technician: Joesph Ahle, RTR   Anesthesia: General endotracheal anesthesia, see anesthesia record   Operation: Bronchoscopy with robotic assistance and biopsies.   Estimated Blood Loss: Minimal   Complications: None  Procedure Note: Clinical indication Evaluation of a newly detected peripheral pulmonary nodule in the right upper lobe, identified on a recent follow-up FDG-PET/CT.   Technique Intraoperative Cone Beam Computed Tomography (CBCT) images were acquired using a GE [employer] OEC 3D system, optimized for thoracic imaging. A high-quality 30-second acquisition protocol was used. The patient was positioned supine on the examination table. Pre-biopsy CBCT scans were performed to confirm the lesion's visibility and to determine the optimal approach. Navigation was performed using an [employer] (Intuitive). Repeat CBCT scans were performed to confirm tool-in-lesion placement and to assess for complications.  Findings Right Upper Lobe Peripheral Pulmonary Nodule: A solid nodule, measuring approximately 11 mm in diameter, is identified in the right upper lobe. The nodule exhibits a well-circumscribed appearance. Bronchial Anatomy: Patent airways are visualized up to the segmental bronchi leading towards the target nodule. Navigation: The Ion bronchoscopic navigation successfully acquired the target and guided biopsy tools towards the right upper lobe nodule. Tool-in-Lesion Confirmation: Intraoperative CBCT confirmed the accurate positioning of the biopsy needle within the nodule. Complications: No immediate complications such as pneumothorax or hemorrhage were observed during or immediately following the procedure.   Impression CBCT-guided navigation bronchoscopy with biopsy was performed for the peripheral pulmonary nodule in the right upper lobe. The procedure was technically successful with confirmed tool-in-lesion placement. Samples were obtained for histological and cytological examination.  Recommendations Submit biopsy samples for histopathological and cytological analysis. Images were uploaded to PACS. Schedule follow-up appointment with the referring physician to discuss the results and treatment plan. Monitor for delayed complications such as pneumothorax or hemorrhage.    Total fluoroscopy time: 2 minutes 1 second, total dose 35.98 mGy.  Please see associated operative report for robotic bronchoscopy description.  [name], MD Advanced Bronchoscopy PCCM [facility]    *This note was generated using voice recognition software/Dragon and/or AI transcription program.  Despite best efforts to proofread, errors can occur which can change the meaning. Any transcriptional errors that result from this process are unintentional and may not be fully corrected at the time of dictation.

## 2023-09-20 NOTE — Transfer of Care (Signed)
 Immediate Anesthesia Transfer of Care Note  Patient: Tracey Walters  Procedure(s) Performed: VIDEO BRONCHOSCOPY WITH ENDOBRONCHIAL NAVIGATION (Right) ENDOBRONCHIAL ULTRASOUND (EBUS) (Right)  Patient Location: PACU  Anesthesia Type:General  Level of Consciousness: awake  Airway & Oxygen Therapy: Patient Spontanous Breathing and Patient connected to face mask oxygen  Post-op Assessment: Report given to RN and Post -op Vital signs reviewed and stable  Post vital signs: Reviewed and stable  Last Vitals:  Vitals Value Taken Time  BP    Temp    Pulse    Resp    SpO2      Last Pain:  Vitals:   09/20/23 1153  TempSrc: Temporal         Complications: No notable events documented.

## 2023-09-21 ENCOUNTER — Encounter: Payer: Self-pay | Admitting: Pulmonary Disease

## 2023-09-21 LAB — SURGICAL PATHOLOGY

## 2023-09-21 NOTE — Anesthesia Postprocedure Evaluation (Signed)
 Anesthesia Post Note  Patient: Leimomi Zervas  Procedure(s) Performed: VIDEO BRONCHOSCOPY WITH ENDOBRONCHIAL NAVIGATION (Right) ENDOBRONCHIAL ULTRASOUND (EBUS) (Right)  Patient location during evaluation: PACU Anesthesia Type: General Level of consciousness: awake and alert Pain management: pain level controlled Vital Signs Assessment: post-procedure vital signs reviewed and stable Respiratory status: spontaneous breathing, nonlabored ventilation and respiratory function stable Cardiovascular status: blood pressure returned to baseline and stable Postop Assessment: no apparent nausea or vomiting Anesthetic complications: no   No notable events documented.   Last Vitals:  Vitals:   09/20/23 1441 09/20/23 1453  BP:  138/65  Pulse: 74 76  Resp: 11 14  Temp:  36.6 C  SpO2: (!) 89% 90%    Last Pain:  Vitals:   09/20/23 1453  TempSrc: Temporal  PainSc: 0-No pain                 Camellia Merilee Louder

## 2023-09-22 ENCOUNTER — Ambulatory Visit: Payer: Self-pay | Admitting: Pulmonary Disease

## 2023-09-22 DIAGNOSIS — R911 Solitary pulmonary nodule: Secondary | ICD-10-CM

## 2023-09-22 LAB — CYTOLOGY - NON PAP

## 2023-09-22 NOTE — Progress Notes (Signed)
 Noted, NFN

## 2023-09-29 ENCOUNTER — Other Ambulatory Visit: Payer: Self-pay

## 2023-10-04 ENCOUNTER — Encounter: Admitting: Nurse Practitioner

## 2023-10-14 ENCOUNTER — Other Ambulatory Visit: Payer: Self-pay | Admitting: General Surgery

## 2023-10-14 DIAGNOSIS — Z1231 Encounter for screening mammogram for malignant neoplasm of breast: Secondary | ICD-10-CM

## 2023-10-19 NOTE — Progress Notes (Signed)
    Assessment & Plan:  1. Solitary pulmonary nodule (Primary)  2. Tobacco dependence due to cigarettes   Patient Instructions  1.  They previously noted nodules in the lung have cleared.  The only nodule is the one that has been present for quite a long time.  2.  We will see you in follow-up in 6 months with a chest CT at that time.  Please note: late entry documentation due to logistical difficulties during COVID-19 pandemic. This note is filed for information purposes only, and is not intended to be used for billing, nor does it represent the full scope/nature of the visit in question. Please see any associated scanned media linked to date of encounter for additional pertinent information.  Subjective:    HPI: Tracey Walters is a 63 y.o. female presenting to the pulmonology clinic on 12/15/2018 with report of: No chief complaint on file.     Outpatient Encounter Medications as of 12/15/2018  Medication Sig Note   [DISCONTINUED] ALPRAZolam  (XANAX ) 0.25 MG tablet Take 0.25 mg by mouth at bedtime as needed for anxiety or sleep.    [DISCONTINUED] amLODipine -benazepril  (LOTREL ) 5-10 MG capsule Take 1 capsule by mouth daily.     [DISCONTINUED] aspirin 81 MG tablet Take 81 mg by mouth daily. (Patient not taking: Reported on 08/11/2019)    [DISCONTINUED] Calcium -Vitamin D -Vitamin K (CHEWABLE CALCIUM  PO) Take 1 each by mouth daily. Viactiv (Patient not taking: Reported on 08/11/2019)    [DISCONTINUED] Cholecalciferol (VITAMIN D -3) 125 MCG (5000 UT) TABS Take 5,000 Units by mouth daily.  (Patient not taking: Reported on 08/11/2019)    [DISCONTINUED] clindamycin  (CLEOCIN ) 150 MG capsule Take 1 capsule (150 mg total) by mouth 3 (three) times daily.    [DISCONTINUED] Cyanocobalamin  (VITAMIN B 12 PO) Take 1,000 mg by mouth daily.    [DISCONTINUED] doxycycline  (VIBRA -TABS) 100 MG tablet Take 1 tablet (100 mg total) by mouth 2 (two) times daily.    [DISCONTINUED] gabapentin (NEURONTIN) 300 MG  capsule Take 300 mg by mouth daily as needed (pain).  10/14/2015: Received from: External Pharmacy   [DISCONTINUED] ibuprofen  (ADVIL ) 800 MG tablet Take 1 tablet (800 mg total) by mouth every 6 (six) hours as needed.    [DISCONTINUED] levofloxacin  (LEVAQUIN ) 750 MG tablet Take 1 tablet (750 mg total) by mouth daily.    [DISCONTINUED] pantoprazole  (PROTONIX ) 40 MG tablet Take 40 mg by mouth daily.    [DISCONTINUED] traMADol  (ULTRAM ) 50 MG tablet Take 50 mg by mouth 2 (two) times daily.    [DISCONTINUED] varenicline  (CHANTIX  CONTINUING MONTH PAK) 1 MG tablet Take 1 tablet (1 mg total) by mouth 2 (two) times daily. (Patient not taking: Reported on 08/11/2019)    [DISCONTINUED] varenicline  (CHANTIX  STARTING MONTH PAK) 0.5 MG X 11 & 1 MG X 42 tablet As directed in package    No facility-administered encounter medications on file as of 12/15/2018.      Objective:   There were no vitals filed for this visit.   Physical exam documentation is limited by delayed entry of information.

## 2023-11-03 ENCOUNTER — Other Ambulatory Visit: Payer: Self-pay

## 2023-12-06 ENCOUNTER — Other Ambulatory Visit: Payer: Self-pay

## 2023-12-27 ENCOUNTER — Other Ambulatory Visit: Payer: Self-pay

## 2023-12-27 ENCOUNTER — Other Ambulatory Visit: Payer: Self-pay | Admitting: Nurse Practitioner

## 2023-12-27 DIAGNOSIS — M79604 Pain in right leg: Secondary | ICD-10-CM

## 2023-12-28 ENCOUNTER — Other Ambulatory Visit: Payer: Self-pay

## 2023-12-30 ENCOUNTER — Other Ambulatory Visit: Payer: Self-pay

## 2023-12-31 ENCOUNTER — Other Ambulatory Visit: Payer: Self-pay

## 2024-01-04 ENCOUNTER — Other Ambulatory Visit: Payer: Self-pay

## 2024-01-07 ENCOUNTER — Other Ambulatory Visit: Payer: Self-pay

## 2024-01-10 ENCOUNTER — Other Ambulatory Visit: Payer: Self-pay

## 2024-01-10 MED FILL — Tramadol HCl Tab 50 MG: ORAL | 30 days supply | Qty: 60 | Fill #0 | Status: AC

## 2024-01-11 ENCOUNTER — Other Ambulatory Visit: Payer: Self-pay

## 2024-01-17 ENCOUNTER — Telehealth: Payer: Self-pay

## 2024-01-17 NOTE — Telephone Encounter (Signed)
 LVM to call and schedule 3 month follow up scan.

## 2024-01-21 ENCOUNTER — Ambulatory Visit
Admission: RE | Admit: 2024-01-21 | Discharge: 2024-01-21 | Disposition: A | Source: Ambulatory Visit | Attending: General Surgery

## 2024-01-21 DIAGNOSIS — Z1231 Encounter for screening mammogram for malignant neoplasm of breast: Secondary | ICD-10-CM | POA: Diagnosis present

## 2024-01-25 ENCOUNTER — Telehealth: Payer: Self-pay | Admitting: Pulmonary Disease

## 2024-01-25 NOTE — Telephone Encounter (Signed)
 Patient has not had CT chest. Upon chart inspection, pt was contacted in Nov 2025 and advised pt cannot afford CT chest. Patient last seen in office on 09/14/2023 and advised to have CT chest in 12/2023. No current appt is scheduled for CT nor OV.   Will send to Dr. Tamea as RICK.

## 2024-01-31 ENCOUNTER — Other Ambulatory Visit: Payer: Self-pay

## 2024-01-31 ENCOUNTER — Telehealth (INDEPENDENT_AMBULATORY_CARE_PROVIDER_SITE_OTHER): Admitting: Physician Assistant

## 2024-01-31 VITALS — Ht 63.0 in | Wt 110.0 lb

## 2024-01-31 DIAGNOSIS — J0100 Acute maxillary sinusitis, unspecified: Secondary | ICD-10-CM | POA: Diagnosis not present

## 2024-01-31 MED ORDER — AZITHROMYCIN 250 MG PO TABS
ORAL_TABLET | ORAL | 0 refills | Status: AC
  Filled 2024-01-31: qty 6, 5d supply, fill #0

## 2024-01-31 NOTE — Progress Notes (Signed)
 [facility] [address] [place], [place] [address]  Internal MEDICINE  Telephone Visit  Patient Name: Tracey Walters  [id number]  [id number]  Date of Service: 01/31/2024  I connected with the patient at 10:20 by telephone and verified the patients identity using two identifiers.   I discussed the limitations, risks, security and privacy concerns of performing an evaluation and management service by telephone and the availability of in person appointments. I also discussed with the patient that there may be a patient responsible charge related to the service.  The patient expressed understanding and agrees to proceed.    Chief Complaint  Patient presents with   Telephone Assessment    [id number]   Telephone Screen    Covid test is negative    Cough   Sinusitis   Chills    HPI Pt is here for virtual sick visit -She has been having cough, congestion, a little chest tightness/congestion, and some chills starting Saturday and got worse yesterday. Sore throat from nasal drip and coughing. 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   amLODipine  (NORVASC ) 2.5 MG tablet Take 1 tablet (2.5 mg total) by mouth daily.   Aspirin-Salicylamide-Caffeine (BC HEADACHE POWDER PO) Take by mouth as needed.   azithromycin  (ZITHROMAX ) 250 MG tablet Take 2 tablets on day 1, then 1 tablet daily on days 2 through 5   cyanocobalamin  (VITAMIN B12) 1000 MCG/ML injection Inject 1 ml intramuscularly every 7 days x2 doses then decrease to every 30 days x5 doses.   pantoprazole  (PROTONIX ) 40 MG tablet Take 1 tablet  (40 mg total) by mouth daily.   rosuvastatin  (CRESTOR ) 20 MG tablet Take 1 tablet (20 mg total) by mouth daily.   Syringe/Needle, Disp, (SYRINGE 3CC/25GX5/8) 25G X 5/8 3 ML MISC Use 1 needle and syringe to draw up medication out of the vial, changing needle prior to injecting medication into skin, use with injectable B12.   traMADol  (ULTRAM ) 50 MG tablet Take 1 tablet (50 mg total) by mouth 2 (two) times daily as needed for moderate pain (pain score 4-6) or severe pain (pain score 7-10).   Vitamin D , Ergocalciferol , (DRISDOL ) 1.25 MG (50000 UNIT) CAPS capsule Take 1 capsule (50,000 Units total) by mouth every 7 (seven) days.   No facility-administered encounter medications on file as of 01/31/2024.    Surgical History: Past Surgical History:  Procedure Laterality Date   ABDOMINAL HYSTERECTOMY  1992   BREAST BIOPSY Right 2011   neg   BREAST BIOPSY Right 11/1996   Dense fibrosis,  benign epithelial hyperplasia.   CARPAL TUNNEL RELEASE Right 2012   COLONOSCOPY  2010   ENDOBRONCHIAL ULTRASOUND Right 01/25/2018   Procedure: ENDOBRONCHIAL ULTRASOUND;  Surgeon: Tamea Dedra CROME, MD;  Location: [facility];  Service: Cardiopulmonary;  Laterality: Right;   ENDOBRONCHIAL ULTRASOUND Right 09/20/2023   Procedure: ENDOBRONCHIAL ULTRASOUND (EBUS);  Surgeon: Tamea Dedra CROME, MD;  Location: [facility];  Service: Pulmonary;  Laterality: Right;   TONSILLECTOMY     TUBAL LIGATION     UPPER GI ENDOSCOPY     VIDEO BRONCHOSCOPY WITH ENDOBRONCHIAL NAVIGATION Right 09/20/2023  Procedure: VIDEO BRONCHOSCOPY WITH ENDOBRONCHIAL NAVIGATION;  Surgeon: Tamea Dedra CROME, MD;  Location: [facility];  Service: Pulmonary;  Laterality: Right;    Medical History: Past Medical History:  Diagnosis Date   Anxiety    Carpal tunnel syndrome    right wrist   Coronary artery disease    Diabetes mellitus without complication (HCC)    Dyspnea    GERD (gastroesophageal reflux disease)    Heart murmur    Hyperlipidemia     Hypertension 20 yrs   Lung nodule    right lower lobe   Mediastinal adenopathy    Pneumonia    Raynaud's phenomenon    Rheumatoid arteritis (HCC)    Ulcer     Family History: Family History  Problem Relation Age of Onset   Heart disease Mother    Atrial fibrillation Mother    Heart disease Father    Heart attack Father    Breast cancer Sister 65   Breast cancer Maternal Aunt     Social History   Socioeconomic History   Marital status: Single    Spouse name: Not on file   Number of children: Not on file   Years of education: Not on file   Highest education level: Not on file  Occupational History   Not on file  Tobacco Use   Smoking status: Every Day    Current packs/day: 2.00    Average packs/day: 2.0 packs/day for 50.0 years (100.1 ttl pk-yrs)    Types: Cigarettes    Start date: 3   Smokeless tobacco: Never   Tobacco comments:    2 PPD  Vaping Use   Vaping status: Never Used  Substance and Sexual Activity   Alcohol use: No   Drug use: No   Sexual activity: Never  Other Topics Concern   Not on file  Social History Narrative   Lives at home with son   Social Drivers of Health   Tobacco Use: High Risk (09/20/2023)   Patient History    Smoking Tobacco Use: Every Day    Smokeless Tobacco Use: Never    Passive Exposure: Not on file  Financial Resource Strain: Not on file  Food Insecurity: Not on file  Transportation Needs: Not on file  Physical Activity: Not on file  Stress: Not on file  Social Connections: Not on file  Intimate Partner Violence: Not on file  Depression (PHQ2-9): Low Risk (09/08/2023)   Depression (PHQ2-9)    PHQ-2 Score: 0  Alcohol Screen: Not on file  Housing: Unknown (01/27/2024)   Received from [facility]   Epic    Unable to Pay for Housing in the Last Year: Not on file    Number of Times Moved in the Last Year: Not on file    At any time in the past 12 months, were you homeless or living in a shelter  (including now)?: No  Utilities: Not on file  Health Literacy: Not on file      Review of Systems  Constitutional:  Positive for chills. Negative for fatigue.  HENT:  Positive for congestion, postnasal drip, sinus pressure and sore throat. Negative for mouth sores.   Respiratory:  Positive for cough and chest tightness. Negative for shortness of breath and wheezing.   Cardiovascular:  Negative for chest pain.  Genitourinary:  Negative for flank pain.  Musculoskeletal:  Negative for myalgias.  Psychiatric/Behavioral: Negative.      Vital Signs: Ht 5' 3 (1.6 m)   Wt  110 lb (49.9 kg)   BMI 19.49 kg/m    Observation/Objective:  Pt is able to carry out conversation   Assessment/Plan: 1. Acute non-recurrent frontal sinusitis (Primary) Will send zpak, and may use albuterol  if needed for URI symptoms. May use OTC cough syrup as needed and advised to rest and stay hydrated - azithromycin  (ZITHROMAX ) 250 MG tablet; Take 2 tablets on day 1, then 1 tablet daily on days 2 through 5  Dispense: 6 tablet; Refill: 0   General Counseling: mozetta murfin understanding of the findings of today's phone visit and agrees with plan of treatment. I have discussed any further diagnostic evaluation that may be needed or ordered today. We also reviewed her medications today. she has been encouraged to call the office with any questions or concerns that should arise related to todays visit.    No orders of the defined types were placed in this encounter.   Meds ordered this encounter  Medications   azithromycin  (ZITHROMAX ) 250 MG tablet    Sig: Take 2 tablets on day 1, then 1 tablet daily on days 2 through 5    Dispense:  6 tablet    Refill:  0    Time spent:25 Minutes    Dr Sigrid CHRISTELLA [facility] Internal medicine

## 2024-03-09 ENCOUNTER — Ambulatory Visit: Admitting: Nurse Practitioner

## 2024-09-08 ENCOUNTER — Encounter: Admitting: Nurse Practitioner
